# Patient Record
Sex: Male | Born: 1935 | Race: White | Hispanic: No | Marital: Married | Smoking: Never smoker
Health system: Southern US, Community
[De-identification: ages and names within clinical notes are randomized; demographics above are authoritative.]

## PROBLEM LIST (undated history)

## (undated) DIAGNOSIS — F0280 Dementia in other diseases classified elsewhere without behavioral disturbance: Secondary | ICD-10-CM

## (undated) DIAGNOSIS — K648 Other hemorrhoids: Secondary | ICD-10-CM

## (undated) DIAGNOSIS — Z86010 Personal history of colonic polyps: Secondary | ICD-10-CM

## (undated) DIAGNOSIS — E8881 Metabolic syndrome: Secondary | ICD-10-CM

## (undated) DIAGNOSIS — K449 Diaphragmatic hernia without obstruction or gangrene: Secondary | ICD-10-CM

## (undated) DIAGNOSIS — F419 Anxiety disorder, unspecified: Secondary | ICD-10-CM

## (undated) DIAGNOSIS — K5730 Diverticulosis of large intestine without perforation or abscess without bleeding: Secondary | ICD-10-CM

## (undated) DIAGNOSIS — K222 Esophageal obstruction: Secondary | ICD-10-CM

## (undated) DIAGNOSIS — N529 Male erectile dysfunction, unspecified: Secondary | ICD-10-CM

## (undated) DIAGNOSIS — I10 Essential (primary) hypertension: Secondary | ICD-10-CM

## (undated) DIAGNOSIS — C4490 Unspecified malignant neoplasm of skin, unspecified: Secondary | ICD-10-CM

## (undated) DIAGNOSIS — G309 Alzheimer's disease, unspecified: Secondary | ICD-10-CM

## (undated) DIAGNOSIS — N189 Chronic kidney disease, unspecified: Secondary | ICD-10-CM

## (undated) DIAGNOSIS — M1990 Unspecified osteoarthritis, unspecified site: Secondary | ICD-10-CM

## (undated) DIAGNOSIS — C61 Malignant neoplasm of prostate: Secondary | ICD-10-CM

## (undated) DIAGNOSIS — E785 Hyperlipidemia, unspecified: Secondary | ICD-10-CM

## (undated) DIAGNOSIS — I2510 Atherosclerotic heart disease of native coronary artery without angina pectoris: Secondary | ICD-10-CM

## (undated) DIAGNOSIS — I739 Peripheral vascular disease, unspecified: Secondary | ICD-10-CM

## (undated) HISTORY — PX: SHOULDER SURGERY: SHX246

## (undated) HISTORY — DX: Unspecified malignant neoplasm of skin, unspecified: C44.90

## (undated) HISTORY — DX: Esophageal obstruction: K22.2

## (undated) HISTORY — DX: Peripheral vascular disease, unspecified: I73.9

## (undated) HISTORY — DX: Male erectile dysfunction, unspecified: N52.9

## (undated) HISTORY — DX: Diaphragmatic hernia without obstruction or gangrene: K44.9

## (undated) HISTORY — DX: Hyperlipidemia, unspecified: E78.5

## (undated) HISTORY — DX: Personal history of colonic polyps: Z86.010

## (undated) HISTORY — DX: Metabolic syndrome: E88.81

## (undated) HISTORY — DX: Diverticulosis of large intestine without perforation or abscess without bleeding: K57.30

## (undated) HISTORY — DX: Chronic kidney disease, unspecified: N18.9

## (undated) HISTORY — PX: TONSILLECTOMY: SUR1361

## (undated) HISTORY — DX: Other hemorrhoids: K64.8

## (undated) HISTORY — PX: CERVICAL FUSION: SHX112

## (undated) HISTORY — DX: Essential (primary) hypertension: I10

## (undated) HISTORY — DX: Malignant neoplasm of prostate: C61

## (undated) HISTORY — PX: BACK SURGERY: SHX140

## (undated) HISTORY — DX: Unspecified osteoarthritis, unspecified site: M19.90

## (undated) HISTORY — DX: Anxiety disorder, unspecified: F41.9

## (undated) HISTORY — DX: Metabolic syndrome: E88.810

---
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
## 1993-01-10 HISTORY — PX: PROSTATECTOMY: SHX69

## 2000-03-31 ENCOUNTER — Emergency Department (HOSPITAL_COMMUNITY): Admission: EM | Admit: 2000-03-31 | Discharge: 2000-03-31 | Payer: Self-pay | Admitting: Internal Medicine

## 2000-03-31 ENCOUNTER — Encounter: Payer: Self-pay | Admitting: Emergency Medicine

## 2000-04-11 ENCOUNTER — Encounter: Admission: RE | Admit: 2000-04-11 | Discharge: 2000-05-11 | Payer: Self-pay | Admitting: Internal Medicine

## 2002-11-07 ENCOUNTER — Encounter: Admission: RE | Admit: 2002-11-07 | Discharge: 2002-11-07 | Payer: Self-pay | Admitting: Internal Medicine

## 2003-01-11 DIAGNOSIS — Z860100 Personal history of colon polyps, unspecified: Secondary | ICD-10-CM

## 2003-01-11 HISTORY — PX: COLONOSCOPY W/ POLYPECTOMY: SHX1380

## 2003-01-11 HISTORY — DX: Personal history of colon polyps, unspecified: Z86.0100

## 2003-01-11 HISTORY — DX: Personal history of colonic polyps: Z86.010

## 2003-12-02 ENCOUNTER — Ambulatory Visit: Payer: Self-pay | Admitting: Internal Medicine

## 2003-12-16 ENCOUNTER — Ambulatory Visit: Payer: Self-pay | Admitting: Gastroenterology

## 2003-12-24 ENCOUNTER — Ambulatory Visit: Payer: Self-pay | Admitting: Gastroenterology

## 2003-12-24 DIAGNOSIS — D126 Benign neoplasm of colon, unspecified: Secondary | ICD-10-CM | POA: Insufficient documentation

## 2003-12-24 DIAGNOSIS — K449 Diaphragmatic hernia without obstruction or gangrene: Secondary | ICD-10-CM | POA: Insufficient documentation

## 2003-12-24 DIAGNOSIS — K5730 Diverticulosis of large intestine without perforation or abscess without bleeding: Secondary | ICD-10-CM | POA: Insufficient documentation

## 2004-01-23 ENCOUNTER — Ambulatory Visit: Payer: Self-pay | Admitting: Gastroenterology

## 2004-03-25 ENCOUNTER — Ambulatory Visit: Payer: Self-pay | Admitting: Internal Medicine

## 2004-05-18 ENCOUNTER — Ambulatory Visit: Payer: Self-pay | Admitting: Internal Medicine

## 2004-07-08 ENCOUNTER — Encounter: Admission: RE | Admit: 2004-07-08 | Discharge: 2004-07-08 | Payer: Self-pay | Admitting: Internal Medicine

## 2004-07-08 ENCOUNTER — Ambulatory Visit: Payer: Self-pay | Admitting: Internal Medicine

## 2004-11-09 ENCOUNTER — Ambulatory Visit: Payer: Self-pay | Admitting: Internal Medicine

## 2005-01-21 ENCOUNTER — Ambulatory Visit: Payer: Self-pay | Admitting: Internal Medicine

## 2005-01-31 ENCOUNTER — Ambulatory Visit (HOSPITAL_BASED_OUTPATIENT_CLINIC_OR_DEPARTMENT_OTHER): Admission: RE | Admit: 2005-01-31 | Discharge: 2005-01-31 | Payer: Self-pay | Admitting: Internal Medicine

## 2005-02-08 ENCOUNTER — Ambulatory Visit: Payer: Self-pay | Admitting: Pulmonary Disease

## 2005-03-02 ENCOUNTER — Ambulatory Visit: Payer: Self-pay | Admitting: Pulmonary Disease

## 2005-06-09 ENCOUNTER — Ambulatory Visit: Payer: Self-pay | Admitting: Internal Medicine

## 2005-06-16 ENCOUNTER — Ambulatory Visit: Payer: Self-pay

## 2005-06-29 ENCOUNTER — Ambulatory Visit: Payer: Self-pay | Admitting: Internal Medicine

## 2005-07-04 ENCOUNTER — Ambulatory Visit: Payer: Self-pay | Admitting: Internal Medicine

## 2005-08-12 ENCOUNTER — Ambulatory Visit: Payer: Self-pay

## 2005-08-12 ENCOUNTER — Encounter: Payer: Self-pay | Admitting: Cardiology

## 2005-10-27 ENCOUNTER — Ambulatory Visit: Payer: Self-pay | Admitting: Internal Medicine

## 2006-02-14 ENCOUNTER — Ambulatory Visit: Payer: Self-pay | Admitting: Internal Medicine

## 2006-02-14 LAB — CONVERTED CEMR LAB
BUN: 18 mg/dL (ref 6–23)
Basophils Relative: 0.5 % (ref 0.0–1.0)
Eosinophils Relative: 1 % (ref 0.0–5.0)
HCT: 46.7 % (ref 39.0–52.0)
Hemoglobin: 15.5 g/dL (ref 13.0–17.0)
Lymphocytes Relative: 30.4 % (ref 12.0–46.0)
Monocytes Absolute: 0.4 10*3/uL (ref 0.2–0.7)
Neutro Abs: 3.5 10*3/uL (ref 1.4–7.7)
TSH: 1.37 microintl units/mL (ref 0.35–5.50)
WBC: 5.8 10*3/uL (ref 4.5–10.5)

## 2006-03-27 DIAGNOSIS — N189 Chronic kidney disease, unspecified: Secondary | ICD-10-CM | POA: Insufficient documentation

## 2006-03-27 DIAGNOSIS — H269 Unspecified cataract: Secondary | ICD-10-CM | POA: Insufficient documentation

## 2006-03-27 DIAGNOSIS — M1990 Unspecified osteoarthritis, unspecified site: Secondary | ICD-10-CM | POA: Insufficient documentation

## 2006-03-27 DIAGNOSIS — N529 Male erectile dysfunction, unspecified: Secondary | ICD-10-CM | POA: Insufficient documentation

## 2006-05-11 ENCOUNTER — Ambulatory Visit: Payer: Self-pay | Admitting: Internal Medicine

## 2006-05-11 ENCOUNTER — Encounter: Payer: Self-pay | Admitting: Internal Medicine

## 2006-05-12 ENCOUNTER — Encounter: Payer: Self-pay | Admitting: Internal Medicine

## 2006-07-04 ENCOUNTER — Telehealth (INDEPENDENT_AMBULATORY_CARE_PROVIDER_SITE_OTHER): Payer: Self-pay | Admitting: *Deleted

## 2006-09-12 ENCOUNTER — Telehealth (INDEPENDENT_AMBULATORY_CARE_PROVIDER_SITE_OTHER): Payer: Self-pay | Admitting: *Deleted

## 2006-09-15 ENCOUNTER — Ambulatory Visit: Payer: Self-pay | Admitting: Internal Medicine

## 2006-09-17 LAB — CONVERTED CEMR LAB
ALT: 16 units/L (ref 0–53)
AST: 18 units/L (ref 0–37)
Bilirubin, Direct: 0.1 mg/dL (ref 0.0–0.3)
Cholesterol: 146 mg/dL (ref 0–200)
HDL: 58 mg/dL (ref >39)
Potassium: 4.7 meq/L (ref 3.5–5.3)
Total CHOL/HDL Ratio: 2.5

## 2006-09-18 ENCOUNTER — Encounter (INDEPENDENT_AMBULATORY_CARE_PROVIDER_SITE_OTHER): Payer: Self-pay | Admitting: *Deleted

## 2006-11-13 ENCOUNTER — Ambulatory Visit: Payer: Self-pay | Admitting: Internal Medicine

## 2006-11-13 DIAGNOSIS — R1310 Dysphagia, unspecified: Secondary | ICD-10-CM | POA: Insufficient documentation

## 2006-11-13 DIAGNOSIS — E8881 Metabolic syndrome: Secondary | ICD-10-CM | POA: Insufficient documentation

## 2006-11-13 DIAGNOSIS — I739 Peripheral vascular disease, unspecified: Secondary | ICD-10-CM | POA: Insufficient documentation

## 2006-11-13 DIAGNOSIS — Z8546 Personal history of malignant neoplasm of prostate: Secondary | ICD-10-CM | POA: Insufficient documentation

## 2006-11-13 DIAGNOSIS — E785 Hyperlipidemia, unspecified: Secondary | ICD-10-CM | POA: Insufficient documentation

## 2006-11-16 ENCOUNTER — Ambulatory Visit: Payer: Self-pay | Admitting: Internal Medicine

## 2006-11-21 ENCOUNTER — Ambulatory Visit: Payer: Self-pay | Admitting: Internal Medicine

## 2006-11-21 ENCOUNTER — Encounter (INDEPENDENT_AMBULATORY_CARE_PROVIDER_SITE_OTHER): Payer: Self-pay | Admitting: *Deleted

## 2006-11-30 ENCOUNTER — Ambulatory Visit: Payer: Self-pay | Admitting: Gastroenterology

## 2006-12-04 ENCOUNTER — Ambulatory Visit (HOSPITAL_COMMUNITY): Admission: RE | Admit: 2006-12-04 | Discharge: 2006-12-04 | Payer: Self-pay | Admitting: Gastroenterology

## 2007-02-03 DIAGNOSIS — K648 Other hemorrhoids: Secondary | ICD-10-CM | POA: Insufficient documentation

## 2007-02-03 DIAGNOSIS — M129 Arthropathy, unspecified: Secondary | ICD-10-CM | POA: Insufficient documentation

## 2007-02-03 DIAGNOSIS — F419 Anxiety disorder, unspecified: Secondary | ICD-10-CM | POA: Insufficient documentation

## 2007-02-03 DIAGNOSIS — G4730 Sleep apnea, unspecified: Secondary | ICD-10-CM | POA: Insufficient documentation

## 2007-02-03 DIAGNOSIS — F411 Generalized anxiety disorder: Secondary | ICD-10-CM

## 2007-03-13 ENCOUNTER — Telehealth (INDEPENDENT_AMBULATORY_CARE_PROVIDER_SITE_OTHER): Payer: Self-pay | Admitting: *Deleted

## 2007-05-14 ENCOUNTER — Ambulatory Visit: Payer: Self-pay | Admitting: Internal Medicine

## 2007-05-23 ENCOUNTER — Ambulatory Visit: Payer: Self-pay | Admitting: Internal Medicine

## 2007-05-23 LAB — CONVERTED CEMR LAB
BUN: 15 mg/dL (ref 6–23)
Creatinine, Ser: 1.6 mg/dL — ABNORMAL HIGH (ref 0.4–1.5)

## 2007-05-29 ENCOUNTER — Encounter: Payer: Self-pay | Admitting: Internal Medicine

## 2007-05-31 ENCOUNTER — Encounter (INDEPENDENT_AMBULATORY_CARE_PROVIDER_SITE_OTHER): Payer: Self-pay | Admitting: *Deleted

## 2007-10-18 ENCOUNTER — Ambulatory Visit: Payer: Self-pay | Admitting: Internal Medicine

## 2008-01-07 ENCOUNTER — Ambulatory Visit: Payer: Self-pay | Admitting: Internal Medicine

## 2008-06-02 ENCOUNTER — Ambulatory Visit: Payer: Self-pay | Admitting: Internal Medicine

## 2008-06-03 ENCOUNTER — Encounter: Payer: Self-pay | Admitting: Internal Medicine

## 2008-06-06 LAB — CONVERTED CEMR LAB
Hgb A1c MFr Bld: 5.9 % (ref 4.6–6.5)
Potassium: 4.5 meq/L (ref 3.5–5.1)

## 2008-07-01 ENCOUNTER — Telehealth (INDEPENDENT_AMBULATORY_CARE_PROVIDER_SITE_OTHER): Payer: Self-pay | Admitting: *Deleted

## 2008-10-06 ENCOUNTER — Ambulatory Visit: Payer: Self-pay | Admitting: Internal Medicine

## 2008-11-27 ENCOUNTER — Ambulatory Visit: Payer: Self-pay | Admitting: Internal Medicine

## 2008-11-29 LAB — CONVERTED CEMR LAB
ALT: 15 units/L (ref 0–53)
Albumin: 4.2 g/dL (ref 3.5–5.2)
Alkaline Phosphatase: 38 units/L — ABNORMAL LOW (ref 39–117)
Cholesterol: 155 mg/dL (ref 0–200)
LDL Cholesterol: 75 mg/dL (ref 0–99)
Total Protein: 7.2 g/dL (ref 6.0–8.3)
Triglycerides: 107 mg/dL (ref 0.0–149.0)
VLDL: 21.4 mg/dL (ref 0.0–40.0)

## 2008-12-01 ENCOUNTER — Encounter (INDEPENDENT_AMBULATORY_CARE_PROVIDER_SITE_OTHER): Payer: Self-pay | Admitting: *Deleted

## 2009-01-12 ENCOUNTER — Telehealth (INDEPENDENT_AMBULATORY_CARE_PROVIDER_SITE_OTHER): Payer: Self-pay | Admitting: *Deleted

## 2009-01-19 ENCOUNTER — Ambulatory Visit: Payer: Self-pay | Admitting: Internal Medicine

## 2009-03-06 ENCOUNTER — Telehealth (INDEPENDENT_AMBULATORY_CARE_PROVIDER_SITE_OTHER): Payer: Self-pay | Admitting: *Deleted

## 2009-05-27 ENCOUNTER — Ambulatory Visit: Payer: Self-pay | Admitting: Internal Medicine

## 2009-05-27 DIAGNOSIS — R079 Chest pain, unspecified: Secondary | ICD-10-CM | POA: Insufficient documentation

## 2009-07-27 ENCOUNTER — Ambulatory Visit: Payer: Self-pay | Admitting: Family Medicine

## 2009-07-27 DIAGNOSIS — M5412 Radiculopathy, cervical region: Secondary | ICD-10-CM | POA: Insufficient documentation

## 2009-09-10 ENCOUNTER — Telehealth: Payer: Self-pay | Admitting: Internal Medicine

## 2009-10-11 ENCOUNTER — Encounter: Payer: Self-pay | Admitting: Internal Medicine

## 2009-11-02 ENCOUNTER — Encounter: Payer: Self-pay | Admitting: Internal Medicine

## 2009-11-02 ENCOUNTER — Ambulatory Visit: Payer: Self-pay | Admitting: Internal Medicine

## 2009-11-02 DIAGNOSIS — F0390 Unspecified dementia without behavioral disturbance: Secondary | ICD-10-CM | POA: Insufficient documentation

## 2009-11-02 DIAGNOSIS — Z85828 Personal history of other malignant neoplasm of skin: Secondary | ICD-10-CM | POA: Insufficient documentation

## 2009-11-02 LAB — CONVERTED CEMR LAB

## 2009-11-06 LAB — CONVERTED CEMR LAB
Albumin: 4.5 g/dL (ref 3.5–5.2)
Alkaline Phosphatase: 39 units/L (ref 39–117)
Basophils Absolute: 0 10*3/uL (ref 0.0–0.1)
Basophils Relative: 0.3 % (ref 0.0–3.0)
CO2: 27 meq/L (ref 19–32)
Calcium: 9.5 mg/dL (ref 8.4–10.5)
Chloride: 103 meq/L (ref 96–112)
Eosinophils Absolute: 0.1 10*3/uL (ref 0.0–0.7)
Glucose, Bld: 94 mg/dL (ref 70–99)
HCT: 47.3 % (ref 39.0–52.0)
HDL: 61.8 mg/dL (ref >39.00)
Hemoglobin: 16 g/dL (ref 13.0–17.0)
Lymphs Abs: 2.5 10*3/uL (ref 0.7–4.0)
MCHC: 33.9 g/dL (ref 30.0–36.0)
MCV: 92.6 fL (ref 78.0–100.0)
Monocytes Absolute: 0.5 10*3/uL (ref 0.1–1.0)
Neutro Abs: 3.4 10*3/uL (ref 1.4–7.7)
RBC: 5.12 M/uL (ref 4.22–5.81)
RDW: 14.4 % (ref 11.5–14.6)
Sodium: 140 meq/L (ref 135–145)
TSH: 1.94 microintl units/mL (ref 0.35–5.50)
Total CHOL/HDL Ratio: 3
Total Protein: 7.2 g/dL (ref 6.0–8.3)

## 2010-02-07 LAB — CONVERTED CEMR LAB
Albumin: 4.6 g/dL (ref 3.5–5.2)
Alkaline Phosphatase: 40 units/L (ref 39–117)
BUN: 15 mg/dL (ref 6–23)
Basophils Absolute: 0 10*3/uL (ref 0.0–0.1)
Cholesterol, target level: 200 mg/dL
Cholesterol: 153 mg/dL (ref 0–200)
Eosinophils Absolute: 0.1 10*3/uL (ref 0.0–0.6)
GFR calc Af Amer: 55 mL/min
HDL: 59.3 mg/dL (ref >39.0)
Hemoglobin: 16.5 g/dL (ref 13.0–17.0)
Hgb A1c MFr Bld: 5.9 % (ref 4.6–6.0)
LDL Cholesterol: 71 mg/dL (ref 0–99)
LDL Goal: 160 mg/dL
Lymphocytes Relative: 40.6 % (ref 12.0–46.0)
MCHC: 33.6 g/dL (ref 30.0–36.0)
MCV: 89.4 fL (ref 78.0–100.0)
Monocytes Absolute: 0.6 10*3/uL (ref 0.2–0.7)
Monocytes Relative: 8.9 % (ref 3.0–11.0)
Neutro Abs: 3.6 10*3/uL (ref 1.4–7.7)
PSA: 0 ng/mL — ABNORMAL LOW (ref 0.10–4.00)
Potassium: 4.8 meq/L (ref 3.5–5.1)
TSH: 1.74 microintl units/mL (ref 0.35–5.50)

## 2010-02-09 NOTE — Assessment & Plan Note (Signed)
Summary: CONSTANT COUGH///SPH   Vital Signs:  Patient profile:   77 year old male Weight:      199.4 pounds BMI:     29.13 Temp:     98.4 degrees F oral Pulse rate:   64 / minute Resp:     16 per minute BP sitting:   106 / 64  (left arm) Cuff size:   large  Vitals Entered By: Shonna Chock (May 27, 2009 12:17 PM) CC: Cough Comments REVIEWED MED LIST, PATIENT AGREED DOSE AND INSTRUCTION CORRECT    CC:  Cough.  History of Present Illness:  Cough      This is a 77 year old man who presents with Cough X 2 weeks.  The patient reports productive cough with white secretions, but denies pleuritic chest pain,but  he describes SS pain with coughing & with direct pressure. He denies  shortness of breath, wheezing, exertional dyspnea, fever, hemoptysis, and malaise.  Associated symtpoms include sore throat, nasal congestion, .Weight loss of 10 #  has been due to CVE , not illness.  The patient denies the following symptoms: cold/URI symptoms (no frontal headache, facial pain or purulence), acid reflux symptoms, and peripheral edema.  The cough is worse with lying down.  Ineffective prior treatments have included OTC cough medications, "day & night formulas".  Risk factors include history of Hiatal Hernia.  Non smoker; no PMH of RAD. His wife has had RTI symptoms X 1 week.  Allergies (verified): No Known Drug Allergies  Review of Systems CV:  Denies difficulty breathing at night, difficulty breathing while lying down, and leg cramps with exertion; No angina type chest pain; he walks & uses machines @ [facility] 3x/week w/o symptoms. Derm:  Complains of lesion(s); Multiple lesions on back" burned off" by Dr Donzetta Starch.  Physical Exam  General:  Appears younger than age,well-nourished,in no acute distress; alert,appropriate and cooperative throughout examination Ears:  External ear exam shows no significant lesions or deformities.  Otoscopic examination reveals clear canals, tympanic membranes are intact  bilaterally without bulging, retraction, inflammation or discharge.TMs dull Nose:  External nasal examination shows no deformity or inflammation. Nasal mucosa are pink and moist without lesions or exudates. Mouth:  Oral mucosa and oropharynx without lesions or exudates.  Teeth in good repair. Chest Wall:  chest wall tenderness bilaterally @ costochrondral -sternal interfaces.   Lungs:  Normal respiratory effort, chest expands symmetrically. Lungs are clear to auscultation, no crackles or wheezes. Heart:  regular rhythm, no gallop, no rub, no JVD, bradycardia, and grade 1 /6 systolic murmur.   Cervical Nodes:  No lymphadenopathy noted Axillary Nodes:  No palpable lymphadenopathy Psych:  memory intact for recent and remote, normally interactive, good eye contact, and not anxious appearing.     Impression & Recommendations:  Problem # 1:  BRONCHITIS-ACUTE (ICD-466.0)  The following medications were removed from the medication list:    Azithromycin 250 Mg Tabs (Azithromycin) .[name]... As per pack    Promethazine Vc/codeine 6.25-5-10 Mg/68ml Syrp (Phenyleph-promethazine-cod) .[name]... 1 tsp q 6 hrs as needed His updated medication list for this problem includes:    Doxycycline Hyclate 100 Mg Caps (Doxycycline hyclate) .[name]... 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 1 qd 2)  Fish Oil 1000 Mg Caps (Omega-3 fatty acids) .[name].. 1 by mouth once daily 3)  Folic  Acid 400 Mcg Tabs (Folic acid) .[name].. 1 by mouth two times a day 4)  Adult Aspirin Low Strength 81 Mg Tbdp (Aspirin) .[name].. 1 by mouth once daily 5)  Simvastatin 40 Mg Tabs (Simvastatin) .[name].. 1 at bedtime when vytorin completed 6)  Lorazepam 0.5 Mg Tabs (Lorazepam) .[name].. 1 q 12 hrs as needed for anxiety 7)  Doxycycline Hyclate 100 Mg Caps (Doxycycline hyclate) .[name].. 1 two times a day x 5 days ,then once  daily ,avoid direct sun 8)  Tramadol Hcl 50 Mg Tabs (Tramadol hcl) .[name].. 1 every 6 hrs as needed for chest pain  Patient Instructions: 1)  Drink as much fluid as you can tolerate for the next few days.Mucinex DM as needed for cough. Prescriptions: TRAMADOL HCL 50 MG TABS (TRAMADOL HCL) 1 every 6 hrs as needed for chest pain  #30 x 0   Entered and Authorized by:   Marga Melnick MD   Signed by:   Marga Melnick MD on 05/27/2009   Method used:   Faxed to ...       [id number]       [address]       [place], [place]  [address]       Ph: [phone] or [phone]       Fax: [phone]   RxID:   [id number] DOXYCYCLINE HYCLATE 100 MG CAPS (DOXYCYCLINE HYCLATE) 1 two times a day X 5 days ,then once daily ,avoid direct sun  #15 x 0   Entered and Authorized by:   Marga Melnick MD   Signed by:   Marga Melnick MD on 05/27/2009   Method used:   Faxed to ...       [id number]       [address]       [place], [place]  [address]       Ph: [phone] or [phone]       Fax: [phone]   RxID:   [id number]

## 2010-02-09 NOTE — Assessment & Plan Note (Signed)
Summary: COUGH, SORE THROAT, CONGESTION//FD   Vital Signs:  Patient profile:   77 year old male Height:      69.5 inches O2 Sat:      100 % on Room air Pulse rate:   58 / minute Resp:     15 per minute BP sitting:   118 / 70  (left arm)  Vitals Entered By: Jeremy Johann CMA (January 19, 2009 10:41 AM)  O2 Flow:  Room air CC: cough yellowish mucous, sore throat, chest congestion, drainage, headaches Comments REVIEWED MED LIST, PATIENT AGREED DOSE AND INSTRUCTION CORRECT    CC:  cough yellowish mucous, sore throat, chest congestion, drainage, and headaches.  History of Present Illness: Onset as coughing & sneezing @ Christmas. Now nasal congestion with some blood & intermittent purulence. Sputum is white, previously green. Rx:[facility] nasal spray, Delsym, Hall's drops  Allergies (verified): No Known Drug Allergies  Review of Systems General:  Complains of chills; denies fever and sweats. Eyes:  Complains of eye irritation; denies discharge and vision loss-both eyes. ENT:  Complains of nasal congestion and sore throat; Pain over crown. Resp:  Denies chest pain with inspiration and coughing up blood; Spreness with cough SS.  Physical Exam  General:  well-nourished,in no acute distress; alert,appropriate and cooperative throughout examination Eyes:  No corneal or conjunctival inflammation noted. EOMI. Perrla.Vision grossly normal. Ears:  TMs scarred Nose:  External nasal examination shows no deformity or inflammation. Nasal mucosa are slightly dry without lesions or exudates. Mouth:  Oral mucosa and oropharynx without lesions or exudates.  Teeth in good repair. Lungs:  Normal respiratory effort, chest expands symmetrically. Lungs are clear to auscultation, no crackles or wheezes but brassy cough. Cervical Nodes:  No lymphadenopathy noted Axillary Nodes:  No palpable lymphadenopathy   Impression & Recommendations:  Problem # 1:  BRONCHITIS-ACUTE (ICD-466.0)  His updated  medication list for this problem includes:    Azithromycin 250 Mg Tabs (Azithromycin) .[name]... As per pack    Promethazine Vc/codeine 6.25-5-10 Mg/77ml Syrp (Phenyleph-promethazine-cod) .[name]... 1 tsp q 6 hrs as needed  Problem # 2:  URI (ICD-465.9)  His updated medication list for this problem includes:    Adult Aspirin Low Strength 81 Mg Tbdp (Aspirin) .[name]... 1 by mouth once daily    Promethazine Vc/codeine 6.25-5-10 Mg/41ml Syrp (Phenyleph-promethazine-cod) .[name]... 1 tsp q 6 hrs as needed  Complete Medication List: 1)  Gnp Omeprazole 20 Mg Tbec (Omeprazole) .[name].. 1 qd 2)  Fish Oil 1000 Mg Caps (Omega-3 fatty acids) .[name].. 1 by mouth once daily 3)  Folic Acid 400 Mcg Tabs (Folic acid) .[name].. 1 by mouth two times a day 4)  Adult Aspirin Low Strength 81 Mg Tbdp (Aspirin) .[name].. 1 by mouth once daily 5)  Simvastatin 40 Mg Tabs (Simvastatin) .[name].. 1 at bedtime when vytorin completed 6)  Lorazepam 0.5 Mg Tabs (Lorazepam) .[name].. 1 q 12 hrs as needed for anxiety 7)  Azithromycin 250 Mg Tabs (Azithromycin) .... As per pack 8)  Promethazine Vc/codeine 6.25-5-10 Mg/46ml Syrp (Phenyleph-promethazine-cod) .[name].. 1 tsp q 6 hrs as needed  Patient Instructions: 1)  Drink as much fluid as you can tolerate for the next few days. Prescriptions: PROMETHAZINE VC/CODEINE 6.25-5-10 MG/5ML SYRP (PHENYLEPH-PROMETHAZINE-COD) 1 tsp q 6 hrs as needed  #120cc x 0   Entered and Authorized by:   Marga Melnick MD   Signed by:   Marga Melnick MD on 01/19/2009   Method used:   Printed then faxed to .Marland KitchenMarland Kitchen  [id number] ([facility]       [address]       [place], [place]  [address]       Ph: [phone] or [phone]       Fax: [phone]   RxID:   [id number] AZITHROMYCIN 250 MG TABS (AZITHROMYCIN) as per pack  #1 x 0   Entered and Authorized by:   Marga Melnick MD   Signed by:   Marga Melnick MD on 01/19/2009   Method used:   Faxed to ...       [id number]        [address]       [place], [place]  [address]       Ph: [phone] or [phone]       Fax: [phone]   RxID:   [id number]

## 2010-02-09 NOTE — Progress Notes (Signed)
  Phone Note Refill Request Message from:  [facility] on [phone]  Refills Requested: Medication #1:  GNP OMEPRAZOLE 20 MG  TBEC 1 qd Initial call taken by: Kandice Hams,  March 06, 2009 10:30 AM    Prescriptions: GNP OMEPRAZOLE 20 MG  TBEC (OMEPRAZOLE) 1 qd  #90 Capsule x 1   Entered by:   Kandice Hams   Authorized by:   Nolon Rod. Paz MD   Signed by:   Kandice Hams on 03/06/2009   Method used:   Faxed to ...       [facility] (mail-order)             ,          Ph: [phone]       Fax: [phone]   RxID:   [id number]

## 2010-02-09 NOTE — Progress Notes (Signed)
Summary: Discuss Issue  Phone Note Call from Patient   Caller: Daughter- Suzanna Obey Summary of Call: Pts daughter calls and states that her mother as well as herself would like to speak with Dr. Alwyn Ren about the mental state of her father. She believes he may be bi-polar but knows he wouldn't discuss this with Dr. Alwyn Ren. Daughter is requesting that if there is anyway they (daughter and mother) can discuss this with Dr. Alwyn Ren without the pt being here she would like to know what can be done. She wants Dr. Alwyn Ren to know about this issue before pt's CPX in November. Please advise. Ok to contact Daughter at work [phone] and have her paged.Lavell Islam  September 10, 2009 4:42 PM Initial call taken by: Lavell Islam,  September 10, 2009 4:43 PM  Follow-up for Phone Call        Please actually write out   specific concerns with examples of worrisome behavior. After I review this,I'll make specific recommendations. It would be very unusual for bipolarity to appear  @ his age& other causes of behavior change must be considered. I'd like to get him in after  I see that report . He has multiple health issues which can be the reason for that appt, @ which bipolar question  can be assessed. Follow-up by: Marga Melnick MD,  September 11, 2009 6:39 AM  Additional Follow-up for Phone Call Additional follow up Details #1::        Discuss with patient daughter will keep log and drop off to office..............Marland KitchenFelecia Deloach CMA  September 21, 2009 10:57 AM

## 2010-02-09 NOTE — Assessment & Plan Note (Signed)
Summary: CPX/FASTING//KN   Vital Signs:  Patient profile:   77 year old male Height:      70.75 inches Weight:      202.0 pounds BMI:     28.48 Temp:     97.6 degrees F oral Pulse rate:   56 / minute Resp:     14 per minute BP sitting:   140 / 78  (left arm) Cuff size:   large  Vitals Entered By: Shonna Chock CMA (November 02, 2009 8:39 AM) CC: CPX with fasting labs  Comments Height recorded with shoes on  Patient would like both the Flu and Shingles Vaccine-patient aware if rash develops-? would arise on the cause due to two vaccines given-patient ok'd information.   Primary Care Provider:  Alfonse Flavors  CC:  CPX with fasting labs .  History of Present Illness: Here for Medicare AWV: 1.Risk factors based on Past M, S, F history:CAD; Dyslipidemia;prostate cancer( chart updated).Letter from daughter concerning obsessive behavior was reviewed but not discussed with him.  2.Physical Activities: walks 3X / week for 1 mile + machines @ [facility] 3.Depression/mood: denies issues but describes familial conflicts 4.Hearing: slightly decreased to whisper @ 6 ft 5.ADL's: no limitations 6.Fall Risk: slight dizziness occasionally bending over                                                                                     7.Home Safety: safety proofed; smoke alarms in place 8.Height, weight, &visual acuity:wall chart read @ 6 ft with lenses 9.Counseling: POA in place ;?no  Living Will in place. 10.Labs ordered based on risk factors: see Orders 11.Referral Coordination: none requested 12. Care Plan: see Instructions 13.Cognitive Assessment: "Oct 11 or 12 ,2011"; memory & recall : 1 of 3 items remembered  ;unable to spell "WORLD" backwards &  math ability poor; mood & affect normal despite above history. Hyperlipidemia Follow-Up      This is a 77 year old man who also presents for Hyperlipidemia follow-up.  The patient reports itching on back intermittently, but denies muscle aches, GI upset, abdominal  pain, flushing, constipation, diarrhea, and fatigue.  The patient denies the following symptoms: chest pain/pressure, exercise intolerance, dypsnea, palpitations, syncope, and pedal edema.  Adjunctive measures currently used by the patient include ASA, folic acid, and fish oil supplements.He is very vague as to whether he is on stati @ this time.    Preventive Screening-Counseling & Management  Alcohol-Tobacco     Alcohol drinks/day: 0     Smoking Status: never  Caffeine-Diet-Exercise     Caffeine use/day: no caffeine     Diet Comments: no special diet     Does Patient Exercise: yes  Hep-HIV-STD-Contraception     Dental Visit-last 6 months no     Sun Exposure-Excessive: no  Safety-Violence-Falls     Seat Belt Use: yes     Firearms in the Home: firearms in the home     Firearm Counseling: not indicated; uses recommended firearm safety measures      Blood Transfusions:  no.        Travel History:  In [place] ?.    Current Medications (verified): 1)  Gnp  Omeprazole 20 Mg  Tbec (Omeprazole) .[name].. 1 Qd 2)  Fish Oil 1000 Mg Caps (Omega-3 Fatty Acids) .[name].. 1 By Mouth Once Daily 3)  Folic Acid 400 Mcg Tabs (Folic Acid) .[name].. 1 By Mouth Two Times A Day 4)  Adult Aspirin Low Strength 81 Mg Tbdp (Aspirin) .[name].. 1 By Mouth Once Daily 5)  Simvastatin 40 Mg Tabs (Simvastatin) .[name].. 1 At Bedtime When Vytorin Completed 6)  Lorazepam 0.5 Mg Tabs (Lorazepam) .[name].. 1 Q 12 Hrs As Needed For Anxiety 7)  Tramadol Hcl 50 Mg Tabs (Tramadol Hcl) .[name].. 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) DIVERTICULOSIS, COLON (ICD-562.10) ANXIETY (ICD-300.00) SLEEP APNEA (ICD-780.57) ARTHRITIS (ICD-716.90) DYSPHAGIA UNSPECIFIED (ICD-787.20) METABOLIC SYNDROME X (ICD-277.7) PVD  (ICD-443.9) HYPERTENSION (ICD-401.9), PMH of HYPERLIPIDEMIA (ICD-272.4) PROSTATE CANCER, HX OF (ICD-V10.46), Dr Aldean Ast IMPOTENCE, ORGANIC ORIGIN [phone]) CATARACT NOS (ICD-366.9) KIDNEY DISEASE, CHRONIC NOS (ICD-585.9) Skin cancer, PMH of, Melanoma 2006  Past Surgical History: Catheterization  1996, Dr Caprice Kluver; now seeing Dr Dietrich Pates Appendectomy Cervical fusion Colon polypectomy Prostatectomy 1995, Dr Aldean Ast Tonsillectomy Shoulder surgery for Rotator cuff repair  Family History: Father: throat cancer Mother:breast cancer,dementia , d MI @ 28 Siblings: 3 bro: CAD; bro prostate cancer; MGM: DM  Social History: no diet Retired Married Never Smoked Alcohol use-no Regular exercise-yes Caffeine use/day:  no caffeine Dental Care w/in 6 mos.:  no Sun Exposure-Excessive:  no Risk analyst Use:  yes Blood Transfusions:  no Does Patient Exercise:  yes  Review of Systems  The patient denies anorexia, fever, weight loss, weight gain, vision loss, hoarseness, prolonged cough, headaches, hemoptysis, melena, hematochezia, hematuria, suspicious skin lesions, unusual weight change, abnormal bleeding, enlarged lymph nodes, and angioedema.         Incontinence with caffeine intake.  Physical Exam  General:  in no acute distress; alert,appropriate and cooperative throughout examination Head:  Normocephalic and atraumatic without obvious abnormalities.  Pattern  alopecia  Eyes:  No corneal or conjunctival inflammation noted.Perrla. Funduscopic exam benign, without hemorrhages, exudates or papilledema.  Ears:  External ear exam shows no significant lesions or deformities.  Otoscopic examination reveals clear canals, tympanic membranes are intact bilaterally without bulging, retraction, inflammation or discharge.TMs dull Nose:  External nasal examination shows no deformity or inflammation. Nasal mucosa are pink and moist without lesions or exudates. Mouth:  Oral mucosa and  oropharynx without lesions or exudates.  Teeth in good repair. Neck:  No deformities, masses, or tenderness noted. Lungs:  Normal respiratory effort, chest expands symmetrically. Lungs are clear to auscultation, no crackles or wheezes. Heart:  Normal rate and regular rhythm. S1 and S2 normal without gallop, click, rub. S4 .Grade  1/2-1 /6 systolic murmur.   Abdomen:  Bowel sounds positive,abdomen soft and non-tender without masses, organomegaly or hernias noted. Genitalia:  Dr Aldean Ast Msk:  No deformity or scoliosis noted of thoracic or lumbar spine but thoracic muscles slightly asymmtric.   Pulses:  R and L carotid,radial,dorsalis pedis and posterior tibial pulses are full and equal bilaterally Extremities:  No clubbing, cyanosis, edema. DJD hand changes Neurologic:  DTRs symmetrical and normal.  Poor focus & memory Skin:  Intact without suspicious lesions or rashes Cervical Nodes:  No lymphadenopathy noted Psych:  normally interactive.  Exhibits obsession on religion. Unemotionally describes estrangement from family."They don't believe in God". I recommended Psychologist: "they  won't go"   Impression & Recommendations:  Problem # 1:  PREVENTIVE HEALTH CARE (ICD-V70.0)  Orders: [facility] -Subsequent Annual Wellness Visit [phone]) Specimen Handling (60454)  Problem # 2:  MEMORY LOSS (ICD-780.93) Possible Obsessive Compulsive Disorder Orders: TLB-B12, Serum-Total ONLY (09811-B14) T-RPR (Syphilis) (78295-62130) Specimen Handling (86578)  Problem # 3:  HYPERLIPIDEMIA (ICD-272.4)  His updated medication list for this problem includes:    Simvastatin 40 Mg Tabs (Simvastatin) .[name]... 1 at bedtime when vytorin completed  Orders: Venipuncture (46962) TLB-Lipid Panel (80061-LIPID) TLB-BMP (Basic Metabolic Panel-BMET) (80048-METABOL) TLB-Hepatic/Liver Function Pnl (80076-HEPATIC) TLB-TSH (Thyroid Stimulating Hormone) (84443-TSH) Specimen Handling (95284) EKG w/ Interpretation  (93000)  Problem # 4:  COLONIC POLYPS, HYPERPLASTIC (ICD-211.3)  Orders: Venipuncture (13244) TLB-CBC Platelet - w/Differential (85025-CBCD) Specimen Handling (01027)  Complete Medication List: 1)  Gnp Omeprazole 20 Mg Tbec (Omeprazole) .[name].. 1 qd 2)  Fish Oil 1000 Mg Caps (Omega-3 fatty acids) .[name].. 1 by mouth once daily 3)  Folic Acid 400 Mcg Tabs (Folic acid) .[name].. 1 by mouth two times a day 4)  Adult Aspirin Low Strength 81 Mg Tbdp (Aspirin) .[name].. 1 by mouth once daily 5)  Simvastatin 40 Mg Tabs (Simvastatin) .[name].. 1 at bedtime when vytorin completed 6)  Lorazepam 0.5 Mg Tabs (Lorazepam) .[name].. 1 q 12 hrs as needed for anxiety 7)  Tramadol Hcl 50 Mg Tabs (Tramadol hcl) .[name].. 1 every 6 hrs as needed for chest pain  Other Orders: Zoster (Shingles) Vaccine Live [phone]) Admin 1st Vaccine (44034) Flu Vaccine 61yrs + MEDICARE PATIENTS (V4259) Administration Flu vaccine - MCR (D6387)  Patient Instructions: 1)  Please consider seeing Psychologist with your family. Verify whether Living Will is in place.Take an Aspirin every day.Consider Audiology evaluation as discussed.Check your Blood Pressure regularly. If it is above: 135/85 ON AVERAGE  you should make an appointment.   Orders Added: 1)  Zoster (Shingles) Vaccine Live [90736] 2)  Admin 1st Vaccine [90471] 3)  Flu Vaccine 79yrs + MEDICARE PATIENTS [Q2039] 4)  Administration Flu vaccine - MCR [G0008] 5)  Est. Patient Level III [56433] 6)  [facility] -Subsequent Annual Wellness Visit [G0439] 7)  Venipuncture [36415] 8)  TLB-Lipid Panel [80061-LIPID] 9)  TLB-BMP (Basic Metabolic Panel-BMET) [80048-METABOL] 10)  TLB-CBC Platelet - w/Differential [85025-CBCD] 11)  TLB-Hepatic/Liver Function Pnl [80076-HEPATIC] 12)  TLB-TSH (Thyroid Stimulating Hormone) [84443-TSH] 13)  TLB-B12, Serum-Total ONLY [82607-B12] 14)  T-RPR (Syphilis) [29518-84166] 15)  Specimen Handling [99000] 16)  EKG w/ Interpretation [93000]   Immunization History:  Pneumovax  Immunization History:    Pneumovax:  historical (11/09/2004)  Tetanus/Td Immunization History:    Tetanus/Td:  historical (08/31/1995)  Immunizations Administered:  Zostavax # 1:    Vaccine Type: Zostavax    Site: Left Arm    Mfr: Merck    Dose: 0.32mL    Route: Ambrose    Given by: Shonna Chock CMA    Exp. Date: 08/28/2010    Lot #: [id number]    VIS given: 10/22/04 given November 02, 2009. Marland Kitchenlbmedflu    Immunization History:  Pneumovax Immunization History:    Pneumovax:  Historical (11/09/2004)  Tetanus/Td Immunization History:    Tetanus/Td:  Historical (08/31/1995)  Immunizations Administered:  Zostavax # 1:    Vaccine Type: Zostavax    Site: Left Arm    Mfr: Merck    Dose: 0.35mL    Route: Stevens    Given by: Shonna Chock CMA    Exp. Date: 08/28/2010    Lot #: [id number]    VIS given: 10/22/04 given  November 02, 2009.      Flu Vaccine Consent Questions     Do you have a history of severe allergic reactions to this vaccine? no    Any prior history of allergic reactions to egg and/or gelatin? no    Do you have a sensitivity to the preservative Thimersol? no    Do you have a past history of Guillan-Barre Syndrome? no    Do you currently have an acute febrile illness? no    Have you ever had a severe reaction to latex? no    Vaccine information given and explained to patient? yes    Are you currently pregnant? no    Lot Number:[id number]   Exp Date:07/10/2010   Site Given  Right Deltoid IM

## 2010-02-09 NOTE — Progress Notes (Signed)
Summary: REFILL  Phone Note Refill Request Message from:  Fax from Pharmacy on [facility] FAX [phone]  Refills Requested: Medication #1:  SIMVASTATIN 40 MG TABS 1 at bedtime WHEN Vytorin completed Initial call taken by: Barb Merino,  January 12, 2009 10:20 AM    Prescriptions: SIMVASTATIN 40 MG TABS (SIMVASTATIN) 1 at bedtime WHEN Vytorin completed  #90 x 3   Entered by:   Shonna Chock   Authorized by:   Marga Melnick MD   Signed by:   Shonna Chock on 01/12/2009   Method used:   Faxed to ...       [facility] (mail-order)             ,          Ph: [phone]       Fax: [phone]   RxID:   [id number]

## 2010-02-09 NOTE — Assessment & Plan Note (Signed)
Summary: neck pain/cbs   Vital Signs:  Patient profile:   77 year old male Height:      69.5 inches Weight:      197 pounds O2 Sat:      98 % on Room air Temp:     98.0 degrees F oral Pulse rate:   56 / minute BP sitting:   100 / 66  (left arm)  Vitals Entered By: Jeremy Johann CMA (July 27, 2009 2:34 PM)  O2 Flow:  Room air CC: stiffness in neck   History of Present Illness: Pt here with wife c/o neck pain after working on computer for several hours yesterday without moving.  Pt with hx c spine surgery.  Current Medications (verified): 1)  Gnp Omeprazole 20 Mg  Tbec (Omeprazole) .[name].. 1 Qd 2)  Fish Oil 1000 Mg Caps (Omega-3 Fatty Acids) .[name].. 1 By Mouth Once Daily 3)  Folic Acid 400 Mcg Tabs (Folic Acid) .[name].. 1 By Mouth Two Times A Day 4)  Adult Aspirin Low Strength 81 Mg Tbdp (Aspirin) .[name].. 1 By Mouth Once Daily 5)  Simvastatin 40 Mg Tabs (Simvastatin) .[name].. 1 At Bedtime When Vytorin Completed 6)  Lorazepam 0.5 Mg Tabs (Lorazepam) .[name].. 1 Q 12 Hrs As Needed For Anxiety 7)  Tramadol Hcl 50 Mg Tabs (Tramadol Hcl) .[name].. 1 Every 6 Hrs As Needed For Chest Pain 8)  Flexeril 10 Mg Tabs (Cyclobenzaprine Hcl) .[name].. 1 By Mouth Three Times A Day 9)  Vicodin Es 7.5-750 Mg Tabs (Hydrocodone-Acetaminophen) .[name].. 1 By Mouth Every 6 Hours As Needed  Allergies (verified): No Known Drug Allergies  Past History:  Family History: Last updated: 11/27/2008 Father: throat CA Mother:breast CA,dementia , d MI @ 24 Siblings: 3 bro CAD; bro prostate CA  Social History: Last updated: 11/13/2006 no diet  Risk Factors: Exercise: no (11/13/2006)  Risk Factors: Smoking Status: never (11/13/2006)  Past medical, surgical, family and social histories (including risk factors) reviewed for relevance to current acute and chronic problems.  Past Medical History: Reviewed history from 06/02/2008 and no changes required. 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 Cath 1996, Dr Caprice Kluver; now seeing Dr Dietrich Pates Appendectomy Cervical fusion Colon polypectomy Prostatectomy 1995, Dr Aldean Ast Tonsillectomy shoulder surgery  Family History: Reviewed history from 11/27/2008 and no changes required. Father: throat CA Mother:breast CA,dementia , d MI @ 40 Siblings: 3 bro CAD; bro prostate CA  Social History: Reviewed history from 11/13/2006 and no changes required. no diet  Review of Systems      See HPI  Physical Exam  General:  Well-developed,well-nourished,in no acute distress; alert,appropriate and cooperative throughout examination Neck:  able to move head very little Msk:  + muscle spasms and tenderness b/l traps Neurologic:  strength normal in all extremities and gait normal.   Psych:  Oriented X3 and normally interactive.     Impression & Recommendations:  Problem # 1:  CERVICAL STRAIN, WITH RADICULOPATHY (ICD-723.4) flexeril and vicodin moist heat  Orders: T-Cervicle Spine 2-3 Views (83151VO) Admin of Therapeutic Inj  intramuscular or subcutaneous (16073) Ketorolac-Toradol 15mg  (X1062)  Complete Medication List: 1)  Gnp Omeprazole 20 Mg  Tbec (Omeprazole) .[name].. 1 qd 2)  Fish Oil 1000 Mg Caps (Omega-3 fatty acids) .[name].. 1 by mouth once daily 3)  Folic Acid 400 Mcg Tabs (Folic acid) .[name].. 1 by mouth two times a day 4)  Adult Aspirin Low Strength 81 Mg Tbdp  (Aspirin) .[name].. 1 by mouth once daily 5)  Simvastatin 40 Mg Tabs (Simvastatin) .[name].. 1 at bedtime when vytorin completed 6)  Lorazepam 0.5 Mg Tabs (Lorazepam) .[name].. 1 q 12 hrs as needed for anxiety 7)  Tramadol Hcl 50 Mg Tabs (Tramadol hcl) .[name].. 1 every 6 hrs as needed for chest pain 8)  Flexeril 10 Mg Tabs (Cyclobenzaprine hcl) .[name].. 1 by mouth three times a day 9)  Vicodin Es 7.5-750 Mg Tabs (Hydrocodone-acetaminophen) .[name].. 1 by mouth every 6 hours as needed Prescriptions: VICODIN ES 7.5-750 MG TABS (HYDROCODONE-ACETAMINOPHEN) 1 by mouth every 6 hours as needed  #30 x 0   Entered and Authorized by:   Loreen Freud DO   Signed by:   Loreen Freud DO on 07/27/2009   Method used:   Print then Give to Patient   RxID:   [id number] FLEXERIL 10 MG TABS (CYCLOBENZAPRINE HCL) 1 by mouth three times a day  #30 x 0   Entered and Authorized by:   Loreen Freud DO   Signed by:   Loreen Freud DO on 07/27/2009   Method used:   [facility] to        [id number]       [address]       [place], [place]  [address]       Ph: [phone] or [phone]       Fax: [phone]   RxID:   [id number]    Medication Administration  Injection # 1:    Medication: Ketorolac-Toradol 15mg     Diagnosis: CERVICAL STRAIN, WITH RADICULOPATHY (ICD-723.4)    Route: IM    Site: LUOQ gluteus    Exp Date: 03/11/2011    Lot #: [id number]    Mfr: novaplus    Comments: 60 mg given to pt    Patient tolerated injection without complications    Given by: Jeremy Johann CMA (July 27, 2009 3:20 PM)  Orders Added: 1)  T-Cervicle Spine 2-3 Views [72040TC] 2)  Admin of Therapeutic Inj  intramuscular or subcutaneous [96372] 3)  Ketorolac-Toradol 15mg  [J1885] 4)  Est. Patient Level III [78938]

## 2010-02-09 NOTE — Letter (Signed)
Summary: Letter with Patient Concerns  Letter with Patient Concerns   Imported By: Lanelle Bal 10/31/2009 08:54:43  _____________________________________________________________________  External Attachment:    Type:   Image     Comment:   External Document

## 2010-04-16 ENCOUNTER — Encounter: Payer: Self-pay | Admitting: Internal Medicine

## 2010-04-20 ENCOUNTER — Encounter: Payer: Self-pay | Admitting: Internal Medicine

## 2010-04-20 ENCOUNTER — Ambulatory Visit (INDEPENDENT_AMBULATORY_CARE_PROVIDER_SITE_OTHER): Payer: Medicare Other | Admitting: Internal Medicine

## 2010-04-20 DIAGNOSIS — D179 Benign lipomatous neoplasm, unspecified: Secondary | ICD-10-CM

## 2010-04-20 DIAGNOSIS — I998 Other disorder of circulatory system: Secondary | ICD-10-CM

## 2010-04-20 DIAGNOSIS — I878 Other specified disorders of veins: Secondary | ICD-10-CM

## 2010-04-20 NOTE — Progress Notes (Signed)
  Subjective:    Patient ID: Matthew [name], male    DOB: 03/24/1930, 77 y.o.   MRN: [id number]  HPI  Skin Lesion:  Location:  See notes Onset#1 ): back lesion noted 1 month ago #2) L forearm for years Course:  #1) no change #2) ? Enlarged over past year Self-treated with: no             Improvement with treatment: N/A  History Pruritis: yes, #1  Tenderness: no  New medications/antibiotics: no  Tick/insect/pet exposure: no  Recent travel: no  New detergent, new clothing, or other topical exposure: no   Red Flags Feeling ill: no  Fever: no  Mouth lesions: no  Facial/tongue swelling/difficulty breathing:  no  Diabetic or immunocompromised: no. PMH of skin cancers & prostate cancer       Review of Systems see above     Objective:   Physical Exam General appearance is one of good health and nourishment. Skull is normocephalic without lymphadenopathy about the head, neck, or axilla. See current vital signs Abdomen: soft and non-tender without masses, organomegaly or hernias noted.  No guarding or rebound.  Skin:  Intact without suspicious lesions or rashes . Phlebolith 0.5 X 0.5 cm L forearmTransilluminating  Lipoma 2 x 2.5 cm R post thorax. No  Inguinal LA.    Assessment & Plan:  #1) lipoma #2) phlebolith Plan : report increased size, redness, or pain

## 2010-04-20 NOTE — Patient Instructions (Signed)
Mix Eucerin 1 part to Cort Aid 1 part and apply  Twice a day to itchy area of skin as needed .

## 2010-05-19 ENCOUNTER — Telehealth: Payer: Self-pay | Admitting: Internal Medicine

## 2010-05-19 MED ORDER — OMEPRAZOLE 20 MG PO CPDR: 20.0000 mg | DELAYED_RELEASE_CAPSULE | Freq: Every day | ORAL | Status: DC

## 2010-05-19 NOTE — Telephone Encounter (Signed)
Called and cancelled rx for [facility] [address] and forwarded rx to [facility]

## 2010-05-19 NOTE — Telephone Encounter (Signed)
Patient's wife  called [facility] to ask for refill---was told that patient had no refills left and to call his doctors office to request new prescription----he needs new prescription for Omeprazole 20 mg --90 day supply plus refills

## 2010-05-25 NOTE — Assessment & Plan Note (Signed)
[facility]                         GASTROENTEROLOGY OFFICE NOTE   Matthew [name], Matthew [name]                        MRN:          [id number]  DATE:11/30/2006                            DOB:          June 26, 1930    Matthew [name] is a 77 year old white male with hypertensive cardiovascular  disease, referred through the courtesy of Dr. Alwyn Ren for evaluation of  dysphagia.   Matthew [name] in the past has been diagnosed as having acid reflux problems  and actually had endoscopy with empiric dilation in December 2005.  He  has been on intermittent PPI therapy ever since and really denies  classic reflux symptoms at this time.  He today relates that he is  having repeat episodes of liquid dysphagia in his retropharyngeal area  with some aspiration and some material coming back through his nares.  There is a note that he has had previous cervical spine fusion therapy  in the early 1990s by Dr. Annell Greening.  He does describe some difficulty  chewing and what sounds like possible weakness of his masticatory  muscles.  He denies other neurological symptoms and has had no history  of peripheral vascular disease, CVA, atrial fibrillation, etcetera.  He  has had no problems with balance, memory, or muscle weakness.  He had  some mild constipation but apparently this currently is not a problem  and he had a negative colonoscopy in 2005, except for some mild  diverticulosis and internal hemorrhoids.   PAST MEDICAL HISTORY:  Remarkable for:  1. Hypertension.  2. Hyperlipidemia.  3. He has also had treatment for carcinoma of the prostate.  4. He suffers from degenerative arthritis.  5. Chronic anxiety syndrome.  6. Sleep apnea.  7. In addition to his neck and prostate surgery, he has had  a      previous tonsillectomy.  8. He had a cardiac catheterization that was unremarkable in June      2007.   MEDICATIONS:  1. Aspirin 81 mg a day.  2. Vytorin 10/40 mg at bedtime.  3.  Folic acid 2 a day.  4. Omeprazole 20 mg a day.  5. Daily fish oil.   He denies drug allergies.   FAMILY HISTORY:  Remarkable for breast cancer in his mother and several  brothers with atherosclerotic cardiovascular disease.  There are no  known gastrointestinal problems in his family.   SOCIAL HISTORY:  He is married and lives with his wife.  He is retired  from his job as a Consulting civil engineer.  He does not smoke or abuse  ethanol.   REVIEW OF SYSTEMS:  Otherwise noncontributory, except for some shortness  of breath with exertion and some chronic fatigue.  He has vague  arthralgias and chronic low back pain.  He denies current  cardiovascular, pulmonary, or other neurological difficulties.   PHYSICAL EXAMINATION:  GENERAL:  He is a healthy-appearing white male in  no distress, appearing his stated age.  VITAL SIGNS:  He is 5 feet 11 inches tall and weighs 204 pounds, blood  pressure 102/64, and pulse was 88 and regular.  HEENT:  Examination of the oropharynx was unremarkable as was  examination of the neck.  I could not appreciate any lymphadenopathy or  other masses in his neck.  He did have some bilateral neck scars but  these were well healed.  CHEST:  Entirely clear and he was in a regular rhythm without murmurs,  gallops, or rubs.  ABDOMEN:  I could not appreciate hepatosplenomegaly, abdominal mass, or  tenderness.  Bowel sounds were normal.  EXTREMITIES:  Unremarkable.  NEUROLOGIC:  There were no gross focal neurologic deficits and testing  of his cranial nerves seemed intact.  His mental status was clear.   ASSESSMENT:  I think Matthew [name] most likely has a neuromuscular  dysphagia and it is probably related to his previous cervical neck  surgery.  However, we need to consider myasthenia gravis and other  possible neurologic etiologies.   RECOMMENDATIONS:  1. Cine-esophagogram and barium swallow with pill.  2. Neurologic referral depending on results of number one.   3. Continue empiric reflux regimen and daily PPI therapy.  4. Other medications as per Dr. Alwyn Ren.     Vania Rea. Jarold Motto, MD, Caleen Essex, FAGA  Electronically Signed    DRP/MedQ  DD: 11/30/2006  DT: 11/30/2006  Job #: [id number]

## 2010-05-28 NOTE — Procedures (Signed)
NAME:  Matthew [name], Matthew [name] NO.:  [id number]   MEDICAL RECORD NO.:  [id number]          PATIENT TYPE:  OUT   LOCATION:  SLEEP CENTER                 FACILITY:  [facility]   PHYSICIAN:  Marcelyn Bruins, M.D. [facility] DATE OF BIRTH:  [birth year]   DATE OF STUDY:  01/31/2005                              NOCTURNAL POLYSOMNOGRAM   REFERRING PHYSICIAN:  Dr. Lona Kettle   INDICATION FOR THE STUDY:  Hypersomnia with sleep apnea.   EPWORTH SCORE:  6.   SLEEP ARCHITECTURE:  The patient had a total sleep time of 298 minutes with  significantly decreased REM and slow wave sleep. Sleep onset latency was  mildly prolonged as was REM onset. Sleep efficiency was decreased at 66%.   RESPIRATORY DATA:  The patient underwent a split night study where during  the first half of the night he was found to have 120 obstructive events in  the first 168 minutes of sleep. This gave him a respiratory disturbance  index of 43 events per hour. The events were much more common in the supine  position and loud snoring was noted. By protocol the patient was then placed  on a medium Respironics Comfort full face mask due to mouth opening and CPAP  titration was initiated. At a pressure of 13 cm the patient had significant  breakthrough events due to suboptimal pressure. Unfortunately, he awoke and  was not able to return to sleep for further titration.   OXYGEN DATA:  The patient had O2 desaturation as low as 83% with his  respiratory events.   CARDIAC DATA:  No clinically significant cardiac arrhythmias.   MOVEMENT/PARASOMNIA:  The patient had small numbers of leg jerks with no  significant sleep disruption.   IMPRESSION:  Split night study reveals severe obstructive sleep apnea with a  respiratory disturbance index of 43 events per hour and oxygen desaturation  as low as 83%. The patient was then placed on CPAP and titrated as high as  13 cm with persistent breakthrough events. The patient was not able to  return to sleep, and therefore optimal titration was not achieved. I would  recommend starting the patient on 10 cm of CPAP at this time and considering  either return for formal titration at a later date verses an auto titrate  machine. Clinical correlation is suggested.          ______________________________  Marcelyn Bruins, M.D. [facility]  Diplomate, American Board of Sleep  Medicine    KC/MEDQ  D:  02/08/2005 11:21:06  T:  02/08/2005 15:27:53  Job:  [id number]

## 2010-09-02 ENCOUNTER — Other Ambulatory Visit: Payer: Self-pay | Admitting: Internal Medicine

## 2010-11-10 ENCOUNTER — Encounter: Payer: Self-pay | Admitting: Internal Medicine

## 2010-11-11 ENCOUNTER — Encounter: Payer: Self-pay | Admitting: Internal Medicine

## 2010-11-11 ENCOUNTER — Ambulatory Visit (INDEPENDENT_AMBULATORY_CARE_PROVIDER_SITE_OTHER): Payer: Medicare Other | Admitting: Internal Medicine

## 2010-11-11 DIAGNOSIS — Z23 Encounter for immunization: Secondary | ICD-10-CM

## 2010-11-11 DIAGNOSIS — D126 Benign neoplasm of colon, unspecified: Secondary | ICD-10-CM

## 2010-11-11 DIAGNOSIS — Z136 Encounter for screening for cardiovascular disorders: Secondary | ICD-10-CM

## 2010-11-11 DIAGNOSIS — E785 Hyperlipidemia, unspecified: Secondary | ICD-10-CM

## 2010-11-11 DIAGNOSIS — Z0000 Encounter for general adult medical examination without abnormal findings: Secondary | ICD-10-CM

## 2010-11-11 DIAGNOSIS — E8881 Metabolic syndrome: Secondary | ICD-10-CM

## 2010-11-11 LAB — LIPID PANEL: HDL: 64.7 mg/dL (ref >39.00)

## 2010-11-11 LAB — CBC WITH DIFFERENTIAL/PLATELET
Basophils Absolute: 0 10*3/uL (ref 0.0–0.1)
Eosinophils Absolute: 0 10*3/uL (ref 0.0–0.7)
Hemoglobin: 15.5 g/dL (ref 13.0–17.0)
Lymphocytes Relative: 43.7 % (ref 12.0–46.0)
MCHC: 34.2 g/dL (ref 30.0–36.0)
Neutro Abs: 2.7 10*3/uL (ref 1.4–7.7)
Neutrophils Relative %: 47.5 % (ref 43.0–77.0)
RDW: 13.3 % (ref 11.5–14.6)

## 2010-11-11 LAB — HEPATIC FUNCTION PANEL
AST: 18 U/L (ref 0–37)
Albumin: 4.5 g/dL (ref 3.5–5.2)
Alkaline Phosphatase: 35 U/L — ABNORMAL LOW (ref 39–117)

## 2010-11-11 LAB — BASIC METABOLIC PANEL
CO2: 30 mEq/L (ref 19–32)
Calcium: 9.5 mg/dL (ref 8.4–10.5)
Creatinine, Ser: 1 mg/dL (ref 0.4–1.5)
Glucose, Bld: 94 mg/dL (ref 70–99)

## 2010-11-11 LAB — TSH: TSH: 1.95 u[IU]/mL (ref 0.35–5.50)

## 2010-11-11 NOTE — Progress Notes (Signed)
Subjective:    Patient ID: Matthew [name], male    DOB: 08-31-1930, 77 y.o.   MRN: [id number]  HPI Medicare Wellness Visit:  The following psychosocial & medical history were reviewed as required by Medicare.   Social history: caffeine: 1 cup coffee/ day , alcohol:  no ,  tobacco use : never  & exercise : [employer] 3 X/ week : walking & weights.   Home & personal  safety / fall risk: see dizziness below, activities of daily living: no limitations , seatbelt use : yes , and smoke alarm employment : yes .  Power of Attorney/Living Will status : yes  Vision ( as recorded per Nurse) & Hearing  evaluation :  Ophth seen last week; cataracts. Acuity decreased; aids NOT worn. Orientation :oriented X3 , memory & recall : 2 of 3 recalled,  math testing: unable to complete,and mood & affect : normal . Depression / anxiety: denied Travel history : [place] 2005 , immunization status :tetanus & PNA vaccine needed , transfusion history:  no, and preventive health surveillance ( colonoscopies, BMD , etc as per protocol/ [facility]): ? Up to date, Dental care:  @ least annually . Chart reviewed &  Updated. Active issues reviewed & addressed.      Review of Systems HYPERLIPIDEMIA: Chest pain, palpitations- no       Dyspnea- no Medications: Compliance- yes  Lightheadedness,Syncope- occasional dizziness upon standing. Previously on BP meds, not now. Edema- no Abd pain, bowel changes- constipation Muscle aches- no    NWG:NFAOZHY HYPERGLYCEMIA: FBS 100 in  2008, pre weight loss Polyuria/phagia/dipsia- no       Visual problems- occasional blurring positionally  Apparently he had remotely seen Dr. Sandria Manly, neurologists in the past. They do not believe this was for assessment of his memory but rather for assessment of peripheral neurologic issues. He describes diffuse constant pressure-type headaches;no treatment . "I try to not pay attention". He is unable to tell me how long he has had the "pressure" in his head. He  questioned whether it might be related to head trauma as a child in his preteen years.             Objective:   Physical Exam Gen.: Healthy and well-nourished in appearance. Alert and cooperative throughout exam. Head: Normocephalic without obvious abnormalities;  pattern alopecia  Eyes: No corneal or conjunctival inflammation noted.  Ears: External  ear exam reveals no significant lesions or deformities. Canals; some wax, R > L.  clear .TMs scarred.  Nose: External nasal exam reveals no deformity or inflammation. Nasal mucosa are pink and moist. No lesions or exudates noted.  Mouth: Oral mucosa and oropharynx reveal no lesions or exudates. Teeth in good repair. Neck: No deformities, masses, or tenderness noted. Range of motion decreased. Thyroid normal. Lungs: Normal respiratory effort; chest expands symmetrically. Lungs are clear to auscultation without rales, wheezes, or increased work of breathing. Heart: Normal rate and rhythm. Normal S1 and S2. No gallop, click, or rub. Grade1/ 6 R base murmur. Abdomen: Bowel sounds normal; abdomen soft and nontender. No masses, organomegaly or hernias noted. Genitalia/DRE: deferred to [facility]   .  Musculoskeletal/extremities: No deformity or scoliosis noted of  the thoracic or lumbar spine. No clubbing, cyanosis, edema, or deformity noted.  .Tone & strength  Normal.Joints;mixed arthritic finger changes. Nail health  good. Vascular: Carotid, radial artery, dorsalis pedis and  posterior tibial pulses are full and equal. No bruits present. Neurologic:  Deep tendon reflexes symmetrical and 1/2+.          Skin: Intact without suspicious lesions or rashes. Lymph: No cervical, axillary  lymphadenopathy present. Psych: Mood and affect are normal. Normally interactive                                                                                           Assessment & Plan:  #1 Medicare Wellness Exam; criteria met ; data entered #2 Problem List reviewed ; Assessment/ Recommendations made #3 I shall review the old chart concerning his memory & headache  issues. He may have been evaluated by Dr. Sandria Manly in the past. Plan: see Orders    EKG reveals nonspecific ST-T wave changes in leads 1, aVL, and V 3 through V6. These are slightly accentuated compared to 11/02/09. [facility] is [facility]

## 2010-11-11 NOTE — Patient Instructions (Signed)
Preventive Health Care: Exercise at least 30-45 minutes a day,  3-4 days a week.  Eat a low-fat diet with lots of fruits and vegetables, up to 7-9 servings per day. Consume less than 40 grams of sugar per day from foods & drinks with High Fructose Corn Sugar as # 1,2,3 or # 4 on label.  

## 2010-12-07 ENCOUNTER — Other Ambulatory Visit: Payer: Self-pay | Admitting: Internal Medicine

## 2011-02-11 ENCOUNTER — Ambulatory Visit (INDEPENDENT_AMBULATORY_CARE_PROVIDER_SITE_OTHER)
Admission: RE | Admit: 2011-02-11 | Discharge: 2011-02-11 | Disposition: A | Discharge status: Home / Self Care | Payer: Medicare Other | Source: Ambulatory Visit | Attending: Internal Medicine | Admitting: Internal Medicine

## 2011-02-11 ENCOUNTER — Encounter: Payer: Self-pay | Admitting: Internal Medicine

## 2011-02-11 ENCOUNTER — Ambulatory Visit (INDEPENDENT_AMBULATORY_CARE_PROVIDER_SITE_OTHER): Payer: Medicare Other | Admitting: Internal Medicine

## 2011-02-11 DIAGNOSIS — R042 Hemoptysis: Secondary | ICD-10-CM

## 2011-02-11 DIAGNOSIS — I7300 Raynaud's syndrome without gangrene: Secondary | ICD-10-CM

## 2011-02-11 DIAGNOSIS — J019 Acute sinusitis, unspecified: Secondary | ICD-10-CM

## 2011-02-11 DIAGNOSIS — J209 Acute bronchitis, unspecified: Secondary | ICD-10-CM

## 2011-02-11 MED ORDER — HYDROCODONE-HOMATROPINE 5-1.5 MG/5ML PO SYRP: 5.0000 mL | ORAL_SOLUTION | Freq: Four times a day (QID) | ORAL | Status: DC | PRN

## 2011-02-11 MED ORDER — AMOXICILLIN 500 MG PO CAPS: 500.0000 mg | ORAL_CAPSULE | Freq: Three times a day (TID) | ORAL | Status: DC

## 2011-02-11 MED ORDER — AMOXICILLIN 500 MG PO CAPS: 500.0000 mg | ORAL_CAPSULE | Freq: Three times a day (TID) | ORAL | Status: AC

## 2011-02-11 NOTE — Patient Instructions (Addendum)
Plain Mucinex for thick secretions ;force NON dairy fluids . Use a Neti pot daily as needed for sinus congestion Order for x-rays entered into  the computer; these will be performed at [address] [facility]. across from [facility]. No appointment is necessary.  Use Eucerin   for the nasal drying.

## 2011-02-11 NOTE — Progress Notes (Signed)
  Subjective:    Patient ID: Matthew [name], male    DOB: August 12, 1930, 78 y.o.   MRN: [id number]  HPI Respiratory tract infection Onset/symptoms:last week with rhinitis Exposures (illness/environmental/extrinsic):ill granddaughter Progression of symptoms:to purulence with blood tinge  Treatments/response:Sudafed & OTC cough meds w/o benefit Present symptoms: Fever/chills/sweats:no Frontal headache:no Facial pain:no Nasal purulence:persists Sore throat:yes, "itchy" Dental pain:no Lymphadenopathy:no Wheezing/shortness of breath:some dyspnea Cough/sputum/hemoptysis:streaks of blood in yellow phlegm Pleuritic pain:no Smoking history:never  There is no personal or family history of lung disease except for lung cancer in his father who was a smoker.           Review of Systems On 02/03/11 he noted some bluish color to his fingertips. This was not in the context of cool environment; his wife said that fingertips were cool to touch .  He denies any other bleeding dyscrasias other than some epistaxis with the acute illness and the streaky hemoptysis. Specifically he has had no hematuria, melena, or rectal bleeding.  He is on no anticoagulants other than prophylactic aspirin     Objective:   Physical Exam General appearance:good health ;well nourished; no acute distress or increased work of breathing is present.  No  lymphadenopathy about the head, neck, or axilla noted.   Eyes: No conjunctival inflammation or lid edema is present.  Ears:  External ear exam shows no significant lesions or deformities.  Otoscopic examination reveals clear canals, tympanic membranes are dull white/ gray but intact bilaterally without bulging, retraction, inflammation or discharge.  Nose:  External nasal examination shows no deformity or inflammation. Nasal mucosa are pink and moist without lesions or exudates. No septal dislocation or deviation.No obstruction to airflow.   Oral exam: Dental hygiene is  good; lips and gums are healthy appearing.There is no oropharyngeal erythema or exudate noted.   Heart:  Normal rate and regular rhythm. S1 and S2 normal without gallop, murmur, click, rub or other extra sounds.   Lungs:Chest clear to auscultation; no wheezes, rhonchi,rales ,or rubs present.No increased work of breathing.    Extremities:  No cyanosis, edema, or clubbing  noted .  Radial artery pulses are intact  Skin: Warm & dry           Assessment & Plan:    #1 rhinosinusitis  #2 bronchitis with some streaky hemoptysis  #3 Raynaud's phenomena  Plan: See orders and recommendations

## 2011-02-18 ENCOUNTER — Other Ambulatory Visit: Payer: Self-pay | Admitting: Internal Medicine

## 2011-02-18 DIAGNOSIS — R042 Hemoptysis: Secondary | ICD-10-CM

## 2011-02-18 DIAGNOSIS — J209 Acute bronchitis, unspecified: Secondary | ICD-10-CM

## 2011-02-18 MED ORDER — HYDROCODONE-HOMATROPINE 5-1.5 MG/5ML PO SYRP: 5.0000 mL | ORAL_SOLUTION | Freq: Four times a day (QID) | ORAL | Status: AC | PRN

## 2011-02-18 NOTE — Telephone Encounter (Signed)
Rx sent 

## 2011-02-18 NOTE — Telephone Encounter (Signed)
90 cc , 1 tsp q 6 hrs prn . OVINB

## 2011-02-18 NOTE — Telephone Encounter (Signed)
Dr.Hopper please advise 

## 2011-03-31 ENCOUNTER — Encounter: Payer: Self-pay | Admitting: Family Medicine

## 2011-03-31 ENCOUNTER — Ambulatory Visit (INDEPENDENT_AMBULATORY_CARE_PROVIDER_SITE_OTHER): Payer: Medicare Other | Admitting: Family Medicine

## 2011-03-31 VITALS — BP 120/70 | HR 59 | Temp 97.9°F | Wt 210.2 lb

## 2011-03-31 DIAGNOSIS — J329 Chronic sinusitis, unspecified: Secondary | ICD-10-CM

## 2011-03-31 MED ORDER — GUAIFENESIN-CODEINE 100-10 MG/5ML PO SYRP: ORAL_SOLUTION | ORAL | Status: DC

## 2011-03-31 MED ORDER — AMOXICILLIN-POT CLAVULANATE 875-125 MG PO TABS: 1.0000 | ORAL_TABLET | Freq: Two times a day (BID) | ORAL | Status: AC

## 2011-03-31 NOTE — Patient Instructions (Signed)

## 2011-03-31 NOTE — Progress Notes (Signed)
  Subjective:     BABY STAIRS is a 78 y.o. male here for evaluation of a cough. Onset of symptoms was 3 days ago. Symptoms have been gradually worsening since that time. The cough is productive and is aggravated by exercise and reclining position. Associated symptoms include: shortness of breath and sputum production. Patient does not have a history of asthma. Patient does not have a history of environmental allergens. Patient has not traveled recently. Patient does not have a history of smoking. Patient has not had a previous chest x-ray. Patient has not had a PPD done.  The following portions of the patient's history were reviewed and updated as appropriate: allergies, current medications, past family history, past medical history, past social history, past surgical history and problem list.  Review of Systems Pertinent items are noted in HPI.    Objective:    Oxygen saturation 97% on room air BP 120/70  Pulse 59  Temp(Src) 97.9 F (36.6 C) (Oral)  Wt 210 lb 3.2 oz (95.346 kg)  SpO2 97% General appearance: alert, cooperative, appears stated age and no distress Ears: normal TM's and external ear canals both ears Nose: green discharge, mild congestion, sinus tenderness bilateral Throat: abnormal findings: mild oropharyngeal erythema and pnd Neck: mild anterior cervical adenopathy, supple, symmetrical, trachea midline and thyroid not enlarged, symmetric, no tenderness/mass/nodules Lungs: clear to auscultation bilaterally Heart: S1, S2 normal Extremities: extremities normal, atraumatic, no cyanosis or edema    Assessment:    Sinusitis    Plan:    Antibiotics per medication orders. Antitussives per medication orders. Avoid exposure to tobacco smoke and fumes. Call if shortness of breath worsens, blood in sputum, change in character of cough, development of fever or chills, inability to maintain nutrition and hydration. Avoid exposure to tobacco smoke and fumes. Trial of steroid  nasal spray.

## 2011-04-20 ENCOUNTER — Ambulatory Visit (INDEPENDENT_AMBULATORY_CARE_PROVIDER_SITE_OTHER): Payer: Medicare Other | Admitting: Internal Medicine

## 2011-04-20 VITALS — BP 120/68 | HR 51 | Temp 98.0°F | Wt 197.0 lb

## 2011-04-20 DIAGNOSIS — R51 Headache: Secondary | ICD-10-CM

## 2011-04-20 DIAGNOSIS — G8929 Other chronic pain: Secondary | ICD-10-CM

## 2011-04-20 DIAGNOSIS — J029 Acute pharyngitis, unspecified: Secondary | ICD-10-CM

## 2011-04-20 LAB — POCT RAPID STREP A (OFFICE): Rapid Strep A Screen: NEGATIVE

## 2011-04-20 MED ORDER — TRAMADOL HCL 50 MG PO TABS: 50.0000 mg | ORAL_TABLET | Freq: Four times a day (QID) | ORAL | Status: DC | PRN

## 2011-04-20 NOTE — Patient Instructions (Addendum)
Please keep a diary of your headaches . Document  each occurrence on the calendar with notation of : #1 any prodrome ( any non headache symptom such as marked fatigue,visual changes, ,etc ) which precedes actual headache ; #2) severity on 1-10 scale; #3) any triggers ( food/ drink,enviromenntal or weather changes ,physical or emotional stress) in 8-12 hour period prior to the headache; & #4) response to any medications or other intervention. Please review "Headache" @ [facility] for additional information.  Please bring this record  to the Neuro  appt  Zicam Melts or Zinc lozenges ; vitamin C 2000 mg daily; & Echinacea for 4-7 days. Report fever, exudate("pus") or progressive pain.

## 2011-04-20 NOTE — Progress Notes (Signed)
  Subjective:    Patient ID: Matthew [name], male    DOB: 1930/12/10, 78 y.o.   MRN: [id number]   HPI Respiratory tract infection Onset/symptoms:3 days ago as itchy throat Exposures (illness/environmental/extrinsic):sick great grand children Progression of symptoms:to ST Treatments/response:no Rx Present symptoms: Fever/chills/sweats:no Frontal headache:no Facial pain:no Nasal purulence:yellow Dental pain:no Lymphadenopathy:no Wheezing/shortness of breath:no Cough/sputum/hemoptysis:white Associated extrinsic/allergic symptoms:itchy eyes/ sneezing:no Past medical history: Seasonal allergies : no/asthma: no Smoking history:never          Review of Systems  As I was leaving the exam room he mentioned that he  has had chronic headaches for years, questionably worse in the last 6 months. He is seen Dr. Avie Echevaria in the past as well as having been evaluated [facility].  He states that there is no difference in his headache from those he has experienced in the past  He describes these as throbbing and over the crown.  He denies blurred vision, double vision, or loss of vision. He had no sudden hearing loss or tinnitus. He denies any weakness or numbness or tingling. He's had no incontinence of urine or stool.     Objective:   Physical Exam General appearance:good health ;well nourished; no acute distress or increased work of breathing is present.  No  lymphadenopathy about the head, neck, or axilla noted.   Eyes: No conjunctival inflammation or lid edema is present. Extraocular motions intact. Field of vision is normal. Vision is normal with lenses  Ears:  External ear exam shows no significant lesions or deformities.  Otoscopic examination reveals gray  tympanic membranes & wax. Marked decrease in hearing bilaterally Nose:  External nasal examination shows no deformity or inflammation. Nasal mucosa are pink and moist without lesions or exudates. No  septal dislocation or deviation.No obstruction to airflow.   Oral exam: Dental hygiene is good; lips and gums are healthy appearing.There is mild  oropharyngeal erythema  w/o  exudate .    Heart:  Normal rate and regular rhythm. S1 and S2 normal without gallop,  click, rub or other extra sounds. Grade 1/6 systolic murmur   Lungs:Chest clear to auscultation; no wheezes, rhonchi,rales ,or rubs present.No increased work of breathing.    Extremities:  No cyanosis, edema, or clubbing  noted. Osteoarthritic change in his hands  Neuro: No definite cranial nerve deficit. Deep tendon reflexes are equal and WNL.Muscular strength and tone are normal.   Psych: "la belle indifference" affect as headaches described   Skin: Warm & dry           Assessment & Plan:  #1 Pharyngitis w/o worrisome history or clinical findings  #2 chronic headaches, actually a chronic, recurrent issue. This is in context of prior  family reported obsessive behavior. I question neuropsychiatric process such as Parkinson's variant  Plan beta strep  Keep  headache diary and bring that record with him to Neuro appt

## 2011-05-06 ENCOUNTER — Telehealth: Payer: Self-pay | Admitting: Internal Medicine

## 2011-05-06 MED ORDER — OMEPRAZOLE 20 MG PO CPDR: 20.0000 mg | DELAYED_RELEASE_CAPSULE | Freq: Every day | ORAL | Status: DC

## 2011-05-06 NOTE — Telephone Encounter (Signed)
Refill: Omeprazole 20mg  cap. 90 day supply

## 2011-05-06 NOTE — Telephone Encounter (Signed)
RX sent

## 2011-05-10 ENCOUNTER — Other Ambulatory Visit: Payer: Self-pay | Admitting: Internal Medicine

## 2011-06-14 ENCOUNTER — Other Ambulatory Visit: Payer: Self-pay | Admitting: Internal Medicine

## 2011-07-27 ENCOUNTER — Ambulatory Visit (INDEPENDENT_AMBULATORY_CARE_PROVIDER_SITE_OTHER): Payer: Medicare Other | Admitting: Internal Medicine

## 2011-07-27 ENCOUNTER — Encounter: Payer: Self-pay | Admitting: Internal Medicine

## 2011-07-27 VITALS — BP 124/70 | HR 70 | Temp 98.3°F | Wt 198.2 lb

## 2011-07-27 DIAGNOSIS — R300 Dysuria: Secondary | ICD-10-CM

## 2011-07-27 DIAGNOSIS — R350 Frequency of micturition: Secondary | ICD-10-CM

## 2011-07-27 DIAGNOSIS — Z8546 Personal history of malignant neoplasm of prostate: Secondary | ICD-10-CM

## 2011-07-27 DIAGNOSIS — R3915 Urgency of urination: Secondary | ICD-10-CM

## 2011-07-27 DIAGNOSIS — IMO0001 Reserved for inherently not codable concepts without codable children: Secondary | ICD-10-CM

## 2011-07-27 LAB — POCT URINALYSIS DIPSTICK
Ketones, UA: UNDETERMINED
Nitrite, UA: UNDETERMINED
Protein, UA: UNDETERMINED
pH, UA: 5

## 2011-07-27 MED ORDER — CIPROFLOXACIN HCL 500 MG PO TABS: 500.0000 mg | ORAL_TABLET | Freq: Two times a day (BID) | ORAL | Status: AC

## 2011-07-27 NOTE — Progress Notes (Signed)
  Subjective:    Patient ID: Matthew [name], male    DOB: 1930-12-27, 78 y.o.   MRN: [id number]  HPI DYSURIA: Symptoms began 07/24/11 in the context of being constipated. This was associated with frequency, urgency  with dysuria which has persisted. He is unsure whether over-the-counter Azo was of benefit; obviously it turned his urine orange.  He denies frequent urinary tract infections or any genitourinary abnormality other than the past history of prostate cancer. He has a followup appointment with his urologist 7/29. On at least one occasion he questioned whether he did have kidney stones but this has never been documented.  His father had nephrectomy on one side and a partial nephrectomy on the other. He is unsure as to the nature of his father's renal disease        Review of Systems He denies hesitancy, flank pain, hematuria, pyuria, fever, chills, or sweats.     Objective:   Physical Exam General appearance is one of good health and nourishment w/o distress.  Eyes: No conjunctival inflammation or scleral icterus is present.    Heart:  Normal rate and regular rhythm. S1 and S2 normal without gallop, murmur, click, rub . S4 with slurring at  LSB     Lungs:Chest clear to auscultation; no wheezes, rhonchi,rales ,or rubs present.No increased work of breathing.   Abdomen: Bowel sounds are decreased but no ileus is present. There is dullness to percussion right upper quadrant. There is minimal discomfort in the left lower quadrant to deep palpation. No guarding or rebound .  No pain to percussion over the flanks  Skin:Warm & dry.  Intact without suspicious lesions or rashes ; no jaundice   Lymphatic: No lymphadenopathy is noted about the head, neck, axilla areas.              Assessment & Plan:  #1 dysuria, urgency, frequency. The Azo inhibits adequate analysis of the urine.  Plan: Antibiotic will be initiated pending return of culture.

## 2011-07-27 NOTE — Patient Instructions (Addendum)
Drink as much nondairy fluids as possible. Avoid spicy foods or alcohol as  these may aggravate the condition. Do not take decongestants. Avoid narcotics if possible.  Please try to go on My Chart within the next 24 hours to allow me to release the results directly to you.

## 2011-07-29 LAB — URINE CULTURE: Colony Count: 2000

## 2011-08-04 ENCOUNTER — Telehealth: Payer: Self-pay | Admitting: Gastroenterology

## 2011-08-04 NOTE — Telephone Encounter (Signed)
Pt's wife informed I have sent for the chart and I will get back with pt. His last ECL was in 2005, but I don't have the path report; wife stated understanding.

## 2011-08-05 NOTE — Telephone Encounter (Signed)
See PA ?

## 2011-08-05 NOTE — Telephone Encounter (Signed)
Informed wife that Dr Jarold Motto doesn't feel pt needs a recall at his age. Explained that unless a pt has problems or a change in bowel patterns, we won't do a recall. She states over the past few months, pt has developed constipation; stool softeners don't work and he usually needs a laxative for any results. The past week or so he's had blood in his stool, but it may be from straining. Pt had recent UTI requiring Cipro. Pt eats fruit w/o helping and he tries to increase his fluids. Please advise. Thanks.

## 2011-08-05 NOTE — Telephone Encounter (Signed)
Dr Jarold Motto, pt had COLON 12/2003 with path showing hyperplastic polyp in the sigmoid; no adenomatous change or malignancy identified. Pt is 78 years old. When should I enter the Recall for? Thanks.

## 2011-08-05 NOTE — Telephone Encounter (Signed)
Not needed

## 2011-08-05 NOTE — Telephone Encounter (Signed)
Informed pt's wife of appt with Willette Cluster, NP on 08/10/11. She asked if pt could do anything new until then and informed her pt could take Miralax up to BID until then; wife stated understanding.

## 2011-08-09 ENCOUNTER — Encounter: Payer: Self-pay | Admitting: *Deleted

## 2011-08-10 ENCOUNTER — Encounter: Payer: Self-pay | Admitting: Nurse Practitioner

## 2011-08-10 ENCOUNTER — Ambulatory Visit (INDEPENDENT_AMBULATORY_CARE_PROVIDER_SITE_OTHER): Payer: Medicare Other | Admitting: Nurse Practitioner

## 2011-08-10 VITALS — BP 124/60 | HR 64 | Ht 70.0 in | Wt 197.1 lb

## 2011-08-10 DIAGNOSIS — K5900 Constipation, unspecified: Secondary | ICD-10-CM | POA: Insufficient documentation

## 2011-08-10 NOTE — Progress Notes (Signed)
08/10/2011 Matthew [name] [id number] May 14, 1930   HISTORY OF PRESENT ILLNESS: Patient is a 78 year old male who has been seen in the past by Dr. Jarold Motto. He has a history of diverticulosis, colon polyps and hemorrhoids. Patient has not been seen here in several years  he comes in today with complaints of rectal bleeding. Patient has had increasding constipaton over the last year. Three weeks ago patient became so severely constipated that he required an enema and manual removal of stool.. He has had intermittent LLQ pain associated with the constipation. He drinks a lot of water and eats high fiber foods. He has not started any new medications recently which could have contributed to the worsening constipation  Past Medical History  Diagnosis Date  . Arthritis   . Anxiety   . Hypertension   . Hyperlipidemia   . Chronic kidney disease   . Prostate cancer   . Cataract     NOS  . Impotence of organic origin   . PVD (peripheral vascular disease)   . Metabolic syndrome X   . Diverticulosis of colon   . Hemorrhoids, internal   . FH: colonic polyps   . Hiatal hernia   . Skin cancer   . History of colon polyps     hyperplastic   Past Surgical History  Procedure Date  . Appendectomy 1950  . Cervical fusion   . Colonoscopy w/ polypectomy   . Prostatectomy 1995    Dr.[name]; no chemo or radiation  . Tonsillectomy   . Shoulder surgery     right    reports that he has never smoked. He has never used smokeless tobacco. He reports that he does not drink alcohol or use illicit drugs. family history includes Breast cancer in his mother; Coronary artery disease in his brother; Dementia in his mother; Diabetes in his maternal grandmother; Heart attack (age of onset:93) in his mother; Heart disease in his brother and maternal grandmother; Kidney disease in his father; Lung cancer in his father; Prostate cancer in his brother; and Throat cancer in his father. No Known Allergies      Outpatient Encounter Prescriptions as of 08/10/2011  Medication Sig Dispense Refill  . aspirin 81 MG tablet Take 81 mg by mouth daily.        . cyclobenzaprine (FLEXERIL) 10 MG tablet Take 10 mg by mouth 2 (two) times daily as needed.      . folic acid (FOLVITE) 400 MCG tablet Take 400 mcg by mouth 2 (two) times daily.        . Omega-3 Fatty Acids (FISH OIL) 1000 MG CAPS Take 1,000 mg by mouth daily.        [name] omeprazole (PRILOSEC) 20 MG capsule Take 1 capsule (20 mg total) by mouth daily.  90 capsule  2  . simvastatin (ZOCOR) 40 MG tablet TAKE 1 TABLET AT BEDTIME WHEN VYTORIN COMPLETED (LABS DUE 10/2010)  90 tablet  2  . traMADol (ULTRAM) 50 MG tablet TAKE 1 TABLET (50 MG TOTAL) BY MOUTH EVERY 6 (SIX) HOURS AS NEEDED FOR PAIN.  30 tablet  0  . DISCONTD: guaiFENesin-codeine (ROBITUSSIN AC) 100-10 MG/5ML syrup 1-2 tsp po qhs prn  120 mL  0  . DISCONTD: LORazepam (ATIVAN) 0.5 MG tablet Take 0.5 mg by mouth every 12 (twelve) hours.        [name] DISCONTD: traMADol (ULTRAM) 50 MG tablet Take 50 mg by mouth every 6 (six) hours as needed.  REVIEW OF SYSTEMS  : Positive for headaches  All other systems reviewed and negative except where noted in the History of Present Illness.   PHYSICAL EXAM: BP 124/60  Pulse 64  Ht 5\' 10"  (1.778 m)  Wt 197 lb 2 oz (89.415 kg)  BMI 28.28 kg/m2 General: Well developed white male in no acute distress Head: Normocephalic and atraumatic Eyes:  sclerae anicteric,conjunctive pink. Ears: Normal auditory acuity Neck: Supple, no masses.  Lungs: Clear throughout to auscultation Heart: Regular rate and rhythm Abdomen: Soft, non distended, nontender. No masses or hepatomegaly noted. Normal Bowel sounds Rectal: No stool in vault Musculoskeletal: Symmetrical with no gross deformities  Skin: No lesions on visible extremities Extremities: No edema or deformities noted Neurological: Alert oriented x 4, grossly nonfocal Cervical Nodes:  No significant cervical  adenopathy Psychological:  Alert and cooperative. Normal mood and affect  ASSESSMENT AND PLAN: 1. chronic constipation, progressive over the last several weeks and associated with intermittent LLQ pain. Will purge bowels over the next couple days and start MiraLax once daily. If this is ineffective, patient will call our office within the next few days. Otherwise he should followup with Dr. Jarold Motto in a few weeks.  2. self-limited rectal bleeding, likely secondary to hemorrhoids. He has only had 2 episodes of bleeding. Hopefully no further bleeding after resolution of constipation.  3. History of colon polyps. Last colonoscopy 2005 was negative for adenomatous colon polyps. No surveillance colonoscopies plan given advanced age.

## 2011-08-10 NOTE — Patient Instructions (Addendum)
We have given you a sample of a Moviprep to help purge your bowels.  Take 1/2 the prep this evening and the other half tomorrow morning.  Take Miralax, 1 dose, 17 grams in 8 oz of water.   We made you a follow up appointment with Dr. Jarold Motto on 08-30-2011 at 2:30 PM.

## 2011-08-11 ENCOUNTER — Encounter: Payer: Self-pay | Admitting: Nurse Practitioner

## 2011-08-11 ENCOUNTER — Telehealth: Payer: Self-pay | Admitting: Nurse Practitioner

## 2011-08-11 NOTE — Progress Notes (Signed)
Reviewed and agree with management. Robert D. Kaplan, M.D., FACG  

## 2011-08-12 ENCOUNTER — Telehealth: Payer: Self-pay | Admitting: *Deleted

## 2011-08-12 NOTE — Telephone Encounter (Signed)
The patient's wife called me and report that he had done the first dose on Wed night 7-31 and the 2nd dose of Moviprep on 8-1 in the AM.  He didn't move his bowels until after he finished the 2nd dose.  He had real good results and was able to get rid of a lot of stool.  He is feeling good .  His wife asked when he should start the Miralax daily.  I told him since he got cleaned out good, and is able to eat, he could start it on Sunday or Monday.

## 2011-08-16 NOTE — Telephone Encounter (Signed)
Good news.

## 2011-08-25 NOTE — Telephone Encounter (Signed)
See note from 08-12-11

## 2011-08-29 ENCOUNTER — Telehealth: Payer: Self-pay | Admitting: Internal Medicine

## 2011-08-29 MED ORDER — SIMVASTATIN 40 MG PO TABS: ORAL_TABLET | ORAL | Status: DC

## 2011-08-29 NOTE — Telephone Encounter (Signed)
Rx sent 

## 2011-08-29 NOTE — Telephone Encounter (Signed)
Refill: Simvastatin tab 40mg . Take 1 tablet at bedtime when vytorin completed. (Labs due 10/2010). 90 day supply

## 2011-08-30 ENCOUNTER — Ambulatory Visit (INDEPENDENT_AMBULATORY_CARE_PROVIDER_SITE_OTHER): Payer: Medicare Other | Admitting: Gastroenterology

## 2011-08-30 ENCOUNTER — Encounter: Payer: Self-pay | Admitting: Gastroenterology

## 2011-08-30 VITALS — BP 112/66 | HR 76 | Ht 69.0 in | Wt 195.2 lb

## 2011-08-30 DIAGNOSIS — K625 Hemorrhage of anus and rectum: Secondary | ICD-10-CM

## 2011-08-30 DIAGNOSIS — K648 Other hemorrhoids: Secondary | ICD-10-CM

## 2011-08-30 DIAGNOSIS — K5730 Diverticulosis of large intestine without perforation or abscess without bleeding: Secondary | ICD-10-CM

## 2011-08-30 DIAGNOSIS — K5900 Constipation, unspecified: Secondary | ICD-10-CM

## 2011-08-30 MED ORDER — HYDROCORTISONE ACETATE 25 MG RE SUPP: 25.0000 mg | Freq: Every evening | RECTAL | Status: AC | PRN

## 2011-08-30 NOTE — Patient Instructions (Addendum)
Your physician has requested that you go to the basement for the following lab work before leaving today: Ifob test. We have sent the following medications to your pharmacy for you to pick up at your convenience: Anusol HC suppositories. Take Metamucil over the counter one teaspoon daily in juice.  Take Miralax as needed at bedtime. High Fiber Diet A high fiber diet changes your normal diet to include more whole grains, legumes, fruits, and vegetables. Changes in the diet involve replacing refined carbohydrates with unrefined foods. The calorie level of the diet is essentially unchanged. The Dietary Reference Intake (recommended amount) for adult males is 38 g per day. For adult females, it is 25 g per day. Pregnant and lactating women should consume 28 g of fiber per day. Fiber is the intact part of a plant that is not broken down during digestion. Functional fiber is fiber that has been isolated from the plant to provide a beneficial effect in the body. PURPOSE  Increase stool bulk.   Ease and regulate bowel movements.   Lower cholesterol.  INDICATIONS THAT YOU NEED MORE FIBER  Constipation and hemorrhoids.   Uncomplicated diverticulosis (intestine condition) and irritable bowel syndrome.   Weight management.   As a protective measure against hardening of the arteries (atherosclerosis), diabetes, and cancer.  NOTE OF CAUTION If you have a digestive or bowel problem, ask your caregiver for advice before adding high fiber foods to your diet. Some of the following medical problems are such that a high fiber diet should not be used without consulting your caregiver:  Acute diverticulitis (intestine infection).   Partial small bowel obstructions.   Complicated diverticular disease involving bleeding, rupture (perforation), or abscess (boil, furuncle).   Presence of autonomic neuropathy (nerve damage) or gastric paresis (stomach cannot empty itself).  GUIDELINES FOR INCREASING  FIBER  Start adding fiber to the diet slowly. A gradual increase of about 5 more grams (2 slices of whole-wheat bread, 2 servings of most fruits or vegetables, or 1 bowl of high fiber cereal) per day is best. Too rapid an increase in fiber may result in constipation, flatulence, and bloating.   Drink enough water and fluids to keep your urine clear or pale yellow. Water, juice, or caffeine-free drinks are recommended. Not drinking enough fluid may cause constipation.   Eat a variety of high fiber foods rather than one type of fiber.   Try to increase your intake of fiber through using high fiber foods rather than fiber pills or supplements that contain small amounts of fiber.   The goal is to change the types of food eaten. Do not supplement your present diet with high fiber foods, but replace foods in your present diet.  INCLUDE A VARIETY OF FIBER SOURCES  Replace refined and processed grains with whole grains, canned fruits with fresh fruits, and incorporate other fiber sources. White rice, white breads, and most bakery goods contain little or no fiber.   Brown whole-grain rice, buckwheat oats, and many fruits and vegetables are all good sources of fiber. These include: broccoli, Brussels sprouts, cabbage, cauliflower, beets, sweet potatoes, white potatoes (skin on), carrots, tomatoes, eggplant, squash, berries, fresh fruits, and dried fruits.   Cereals appear to be the richest source of fiber. Cereal fiber is found in whole grains and bran. Bran is the fiber-rich outer coat of cereal grain, which is largely removed in refining. In whole-grain cereals, the bran remains. In breakfast cereals, the largest amount of fiber is found in those with "bran"  in their names. The fiber content is sometimes indicated on the label.   You may need to include additional fruits and vegetables each day.   In baking, for 1 cup white flour, you may use the following substitutions:   1 cup whole-wheat flour  minus 2 tbs.    cup white flour plus  cup whole-wheat flour.  Document Released: 12/27/2004 Document Revised: 12/16/2010 Document Reviewed: 11/04/2008 [facility] Patient Information 2012 [place], [place].  Diverticulosis Diverticulosis is a common condition that develops when small pouches (diverticula) form in the wall of the colon. The risk of diverticulosis increases with age. It happens more often in people who eat a low-fiber diet. Most individuals with diverticulosis have no symptoms. Those individuals with symptoms usually experience abdominal pain, constipation, or loose stools (diarrhea). HOME CARE INSTRUCTIONS   Increase the amount of fiber in your diet as directed by your caregiver or dietician. This may reduce symptoms of diverticulosis.   Your caregiver may recommend taking a dietary fiber supplement.   Drink at least 6 to 8 glasses of water each day to prevent constipation.   Try not to strain when you have a bowel movement.   Your caregiver may recommend avoiding nuts and seeds to prevent complications, although this is still an uncertain benefit.   Only take over-the-counter or prescription medicines for pain, discomfort, or fever as directed by your caregiver.  FOODS WITH HIGH FIBER CONTENT INCLUDE:  Fruits. Apple, peach, pear, tangerine, raisins, prunes.   Vegetables. Brussels sprouts, asparagus, broccoli, cabbage, carrot, cauliflower, romaine lettuce, spinach, summer squash, tomato, winter squash, zucchini.   Starchy Vegetables. Baked beans, kidney beans, lima beans, split peas, lentils, potatoes (with skin).   Grains. Whole wheat bread, brown rice, bran flake cereal, plain oatmeal, white rice, shredded wheat, bran muffins.  SEEK IMMEDIATE MEDICAL CARE IF:   You develop increasing pain or severe bloating.   You have an oral temperature above 102 F (38.9 C), not controlled by medicine.   You develop vomiting or bowel movements that are bloody or black.   Document Released: 09/24/2003 Document Revised: 12/16/2010 Document Reviewed: 05/27/2009 [facility] Patient Information 2012 [place], [place].  Hemorrhoids Hemorrhoids are enlarged (dilated) veins around the rectum. There are 2 types of hemorrhoids, and the type of hemorrhoid is determined by its location. Internal hemorrhoids occur in the veins just inside the rectum.They are usually not painful, but they may bleed.However, they may poke through to the outside and become irritated and painful. External hemorrhoids involve the veins outside the anus and can be felt as a painful swelling or hard lump near the anus.They are often itchy and may crack and bleed. Sometimes clots will form in the veins. This makes them swollen and painful. These are called thrombosed hemorrhoids. CAUSES Causes of hemorrhoids include:  Pregnancy. This increases the pressure in the hemorrhoidal veins.   Constipation.   Straining to have a bowel movement.   Obesity.   Heavy lifting or other activity that caused you to strain.  TREATMENT Most of the time hemorrhoids improve in 1 to 2 weeks. However, if symptoms do not seem to be getting better or if you have a lot of rectal bleeding, your caregiver may perform a procedure to help make the hemorrhoids get smaller or remove them completely.Possible treatments include:  Rubber band ligation. A rubber band is placed at the base of the hemorrhoid to cut off the circulation.   Sclerotherapy. A chemical is injected to shrink the hemorrhoid.   Infrared light therapy.  Tools are used to burn the hemorrhoid.   Hemorrhoidectomy. This is surgical removal of the hemorrhoid.  HOME CARE INSTRUCTIONS   Increase fiber in your diet. Ask your caregiver about using fiber supplements.   Drink enough water and fluids to keep your urine clear or pale yellow.   Exercise regularly.   Go to the bathroom when you have the urge to have a bowel movement. Do not wait.   Avoid  straining to have bowel movements.   Keep the anal area dry and clean.   Only take over-the-counter or prescription medicines for pain, discomfort, or fever as directed by your caregiver.  If your hemorrhoids are thrombosed:  Take warm sitz baths for 20 to 30 minutes, 3 to 4 times per day.   If the hemorrhoids are very tender and swollen, place ice packs on the area as tolerated. Using ice packs between sitz baths may be helpful. Fill a plastic bag with ice. Place a towel between the bag of ice and your skin.   Medicated creams and suppositories may be used or applied as directed.   Do not use a donut-shaped pillow or sit on the toilet for long periods. This increases blood pooling and pain.  SEEK MEDICAL CARE IF:   You have increasing pain and swelling that is not controlled with your medicine.   You have uncontrolled bleeding.   You have difficulty or you are unable to have a bowel movement.   You have pain or inflammation outside the area of the hemorrhoids.   You have chills or an oral temperature above 102 F (38.9 C).  MAKE SURE YOU:   Understand these instructions.   Will watch your condition.   Will get help right away if you are not doing well or get worse.  Document Released: 12/25/1999 Document Revised: 12/16/2010 Document Reviewed: 05/01/2007 [facility] Patient Information 2012 [place], [place].

## 2011-08-30 NOTE — Progress Notes (Signed)
History of Present Illness:  This is a 78 year old Caucasian male with chronic constipation worse over the last few months with associated rectal bleeding one month ago at which time he was seen by her nurse practitioner. He was placed on lightly MiraLax, returns for followup. His constipation is improved companies had no further rectal bleeding. Last colonoscopy was 2005. Review of his labs shows no evidence of anemia or other metabolic abnormalities. Colonoscopy did reveal rather severe diverticulosis. The patient is on aspirin 81 mg a day. Family history is noncontributory.  I have reviewed this patient's present history, medical and surgical past history, allergies and medications.     ROS: The remainder of the 10 point ROS is negative... no upper GI symptoms but the patient is on daily Prilosec apparently for GERD.     Physical Exam: Blood pressure 112/66, pulse 76 and regular, and weight under 95 pounds the BMI of 28.83. General well developed well nourished patient in no acute distress, appearing their stated age Eyes PERRLA, no icterus, fundoscopic exam per opthamologist Skin no lesions noted Neck supple, no adenopathy, no thyroid enlargement, no tenderness Chest clear to percussion and auscultation Heart no significant murmurs, gallops or rubs noted Abdomen no hepatosplenomegaly masses or tenderness, BS normal.  Rectal inspection normal no fissures, or fistulae noted.  No masses or tenderness on digital exam. Stool guaiac negative. Extremities no acute joint lesions, edema, phlebitis or evidence of cellulitis. Neurologic patient oriented x 3, cranial nerves intact, no focal neurologic deficits noted. Psychological mental status normal and normal affect . ANOSCOPY: I cannot visualize any fissures or fistulae or perianal lesions. Anoscopic exam showed a posterior lateral in the left lateral internal hemorrhoid column without evidence of colitis, fistulae, but there were some stigmata  of recent bleeding. Digital exam otherwise was unremarkable.    Assessment and plan: Bleeding internal hemorrhoids associated with diverticulosis and chronic constipation. I placed him on a high fiber diet with daily Metamucil, liberal by mouth fluids, and when necessary MiraLax at bedtime. Also Anusol-HC suppositories to be used on a when necessary basis for hemorrhoidal bleeding. I have instructed him and IFOB stool cards. If these are positive he will need followup colonoscopy exam. Otherwise he is to continue medications as per primary care.  No diagnosis found.

## 2011-09-01 ENCOUNTER — Other Ambulatory Visit: Payer: Medicare Other

## 2011-09-01 DIAGNOSIS — K5900 Constipation, unspecified: Secondary | ICD-10-CM

## 2011-10-10 ENCOUNTER — Other Ambulatory Visit: Payer: Self-pay | Admitting: Internal Medicine

## 2011-11-03 ENCOUNTER — Ambulatory Visit (INDEPENDENT_AMBULATORY_CARE_PROVIDER_SITE_OTHER): Payer: Medicare Other

## 2011-11-03 DIAGNOSIS — Z23 Encounter for immunization: Secondary | ICD-10-CM

## 2012-01-13 ENCOUNTER — Telehealth: Payer: Self-pay | Admitting: Internal Medicine

## 2012-01-13 MED ORDER — SIMVASTATIN 40 MG PO TABS: ORAL_TABLET | ORAL | Status: DC

## 2012-01-13 NOTE — Telephone Encounter (Signed)
Rx sent 

## 2012-01-13 NOTE — Telephone Encounter (Signed)
Refill: simvastatin tab 40 mg. Take 1 by mouth at bedtime. 90 day supply

## 2012-01-16 ENCOUNTER — Other Ambulatory Visit: Payer: Self-pay | Admitting: Internal Medicine

## 2012-01-16 NOTE — Telephone Encounter (Signed)
Patient aware to stop by office to sign controlled substance contract and pick-up rx

## 2012-02-02 ENCOUNTER — Ambulatory Visit (INDEPENDENT_AMBULATORY_CARE_PROVIDER_SITE_OTHER): Payer: Medicare Other | Admitting: Internal Medicine

## 2012-02-02 ENCOUNTER — Encounter: Payer: Self-pay | Admitting: Internal Medicine

## 2012-02-02 VITALS — BP 122/70 | HR 65 | Temp 98.0°F | Resp 12 | Ht 70.08 in | Wt 198.0 lb

## 2012-02-02 DIAGNOSIS — E8881 Metabolic syndrome: Secondary | ICD-10-CM

## 2012-02-02 DIAGNOSIS — D126 Benign neoplasm of colon, unspecified: Secondary | ICD-10-CM

## 2012-02-02 DIAGNOSIS — R413 Other amnesia: Secondary | ICD-10-CM

## 2012-02-02 DIAGNOSIS — E785 Hyperlipidemia, unspecified: Secondary | ICD-10-CM

## 2012-02-02 DIAGNOSIS — Z0000 Encounter for general adult medical examination without abnormal findings: Secondary | ICD-10-CM

## 2012-02-02 DIAGNOSIS — Z23 Encounter for immunization: Secondary | ICD-10-CM

## 2012-02-02 DIAGNOSIS — R9431 Abnormal electrocardiogram [ECG] [EKG]: Secondary | ICD-10-CM

## 2012-02-02 LAB — HEPATIC FUNCTION PANEL
Albumin: 4.3 g/dL (ref 3.5–5.2)
Alkaline Phosphatase: 36 U/L — ABNORMAL LOW (ref 39–117)
Bilirubin, Direct: 0 mg/dL (ref 0.0–0.3)
Total Protein: 7.1 g/dL (ref 6.0–8.3)

## 2012-02-02 LAB — CBC WITH DIFFERENTIAL/PLATELET
Basophils Absolute: 0 10*3/uL (ref 0.0–0.1)
Eosinophils Absolute: 0 10*3/uL (ref 0.0–0.7)
Hemoglobin: 14.6 g/dL (ref 13.0–17.0)
Lymphocytes Relative: 35.8 % (ref 12.0–46.0)
MCHC: 33.6 g/dL (ref 30.0–36.0)
Monocytes Relative: 6.9 % (ref 3.0–12.0)
Neutro Abs: 3 10*3/uL (ref 1.4–7.7)
Platelets: 148 10*3/uL — ABNORMAL LOW (ref 150.0–400.0)
RDW: 13.1 % (ref 11.5–14.6)

## 2012-02-02 LAB — LIPID PANEL
HDL: 61.5 mg/dL (ref >39.00)
Total CHOL/HDL Ratio: 3
Triglycerides: 110 mg/dL (ref 0.0–149.0)
VLDL: 22 mg/dL (ref 0.0–40.0)

## 2012-02-02 LAB — BASIC METABOLIC PANEL
BUN: 18 mg/dL (ref 6–23)
CO2: 29 mEq/L (ref 19–32)
Calcium: 9.1 mg/dL (ref 8.4–10.5)
Creatinine, Ser: 1.6 mg/dL — ABNORMAL HIGH (ref 0.4–1.5)
GFR: 46.08 mL/min — ABNORMAL LOW (ref >60.00)
Glucose, Bld: 89 mg/dL (ref 70–99)
Sodium: 138 mEq/L (ref 135–145)

## 2012-02-02 LAB — HEMOGLOBIN A1C: Hgb A1c MFr Bld: 5.9 % (ref 4.6–6.5)

## 2012-02-02 NOTE — Progress Notes (Signed)
Subjective:    Patient ID: Matthew [name], male    DOB: Jul 26, 1930, 79 y.o.   MRN: [id number]  HPI Medicare Wellness Visit:  Psychosocial & medical history were reviewed as required by Medicare (abuse,antisocial behavioral risks,firearm risk).  Social history: caffeine:minimal  , alcohol: no  ,  tobacco use: no   Exercise :goes to  [place] 3X/ week No home & personal  safety / fall risk Activities of daily living: no limitations  Seatbelt  and smoke alarm employed. Power of Attorney/Living Will status : in place Ophthalmology exam current Hearing aids not worn Orientation :01/29/12; knew President  Memory & recall : 0 of 3 ; 1 of 3 on repeat testing Spelling  Testing:correct  Mood & affect : normal . Depression / anxiety: denied Travel history : [place] ? year  Immunization status : tetanus given Transfusion history:  none  Preventive health surveillance ( colonoscopies, BMD , mammograms,PAP as per protocol/ [facility]): current  Dental care:  Every 6 mos. Chart reviewed &  Updated. Active issues reviewed & addressed.       Review of Systems  PMH of FASTING HYPERGLYCEMIA : Disease Monitoring:no monitor Medication Compliance: no diabetic meds Diet: low  carb   HYPERLIPIDEMIA: Diet & exercise as noted Medication Compliance:  Statin taken  FH premature CAD / CVA :  no (updated & recorded in  FH)         Chest pain, palpitations , claudications, PNDyspnea: no     Exertional dyspnea:no Lightheadedness,Syncope: no Edema:no Polyuria/phagia/dipsia :no Numbness & tingling :burning in feet Non healing skin lesions: no   Blurred , double or loss of vision:no Abd pain, bowel changes:no  Myalgias:no             Objective:   Physical Exam Gen.: well-nourished in appearance. Alert, appropriate and cooperative throughout exam. Appears younger than stated age  Head: Normocephalic without obvious abnormalities;  pattern alopecia  Eyes: No corneal or conjunctival inflammation noted.   Extraocular motion intact. Vision grossly normal. Ears: External  ear exam reveals no significant lesions or deformities. Canals clear .TMs normal. Hearing is grossly decreased bilaterally. Nose: External nasal exam reveals no deformity or inflammation. Nasal mucosa are pink and moist. No lesions or exudates noted.  Mouth: Oral mucosa and oropharynx reveal no lesions or exudates. Teeth in good repair. Neck: No deformities, masses, or tenderness noted. Range of motion decreased. Thyroid normal Lungs: Normal respiratory effort; chest expands symmetrically. Lungs are clear to auscultation without rales, wheezes, or increased work of breathing. Heart: Normal rate and rhythm. Normal S1 and S2. No gallop, click, or rub. S4 w/o murmur. Abdomen: Bowel sounds normal; abdomen soft and nontender. No masses, organomegaly or hernias noted. Genitalia: [facility]                                    Musculoskeletal/extremities: There is some asymmetry of the posterior thoracic musculature suggesting occult scoliosis.  No clubbing, cyanosis, or edema noted. Range of motion decreased @ knees ; crepitus L > R .Tone & strength  normal.Joints   DJD hand changes. Nail health good. Able to lie down & sit up w/o help. Negative SLR bilaterally Vascular: Carotid, radial artery, dorsalis pedis and  posterior tibial pulses are full and equal. No bruits present. Neurologic: Alert and oriented x3. Deep tendon reflexes symmetrical but 1/2 + @ knees.  Skin: Intact without suspicious lesions or rashes. Solar changes  Lymph: No cervical, axillary lymphadenopathy present. Psych: Mood and affect are normal. Normally interactive                                                                                         Assessment & Plan:  #1 Medicare Wellness Exam; criteria met ; data entered #2 Problem List reviewed ; Assessment/ Recommendations made #3 progressive ST-T changes; changes in leads 1, aVL, and the 5-6 are stable.  There is new T-wave inversion in leads V3-4. Plan: see Orders

## 2012-02-02 NOTE — Patient Instructions (Addendum)
Take the EKG to any emergency room or preop visits. There are nonspecific changes; as long as there is no new change these are not clinically significant . If the old EKG is not available for comparison; it may result in unnecessary hospitalization for observation with significant unnecessary expense.  Review and correct the record as indicated. Please share record with all medical staff seen.

## 2012-02-03 ENCOUNTER — Encounter: Payer: Self-pay | Admitting: Internal Medicine

## 2012-02-16 ENCOUNTER — Encounter: Payer: Self-pay | Admitting: Cardiology

## 2012-02-16 ENCOUNTER — Ambulatory Visit (INDEPENDENT_AMBULATORY_CARE_PROVIDER_SITE_OTHER): Payer: Medicare Other | Admitting: Cardiology

## 2012-02-16 VITALS — BP 135/77 | HR 70 | Ht 70.0 in | Wt 197.1 lb

## 2012-02-16 DIAGNOSIS — R9431 Abnormal electrocardiogram [ECG] [EKG]: Secondary | ICD-10-CM | POA: Insufficient documentation

## 2012-02-16 DIAGNOSIS — I739 Peripheral vascular disease, unspecified: Secondary | ICD-10-CM

## 2012-02-16 NOTE — Patient Instructions (Addendum)
The current medical regimen is effective;  continue present plan and medications.  Your physician has requested that you have a lexiscan myoview. For further information please visit [email or web address]. Please follow instruction sheet, as given.  Follow up will be based on these results 

## 2012-02-16 NOTE — Progress Notes (Signed)
HPI The patient presents for evaluation of an abnormal EKG. He was noted to have T-wave inversions in his anterior leads this year during routine exam. He has not had this on his prior EKG. He is actually quite active. He denies any neck or arm discomfort.  However, he will occasionally get some chest pressure at peak exercise. He somewhat vague about this. He doesn't quantify or qualify this. He doesn't report associated symptoms. He's not sure how long this has been going on or whether it is new. He has no PND or orthopnea. He denies any palpitations, presyncope or syncope. He has no weight gain or edema.  No Known Allergies  Current Outpatient Prescriptions  Medication Sig Dispense Refill  . aspirin 81 MG tablet Take 81 mg by mouth daily.        [name] omeprazole (PRILOSEC) 20 MG capsule Take 1 capsule (20 mg total) by mouth daily.  90 capsule  2  . polyethylene glycol (MIRALAX / GLYCOLAX) packet Take 17 g by mouth daily.      . psyllium (METAMUCIL) 58.6 % packet Take 1 packet by mouth daily.      . simvastatin (ZOCOR) 40 MG tablet TAKE 1 TABLET AT BEDTIME WHEN VYTORIN COMPLETED (LABS DUE 10/2010)  90 tablet  0  . traMADol (ULTRAM) 50 MG tablet TAKE 1 TABLET BY MOUTH TWICE A DAY AS NEEDED  60 tablet  1    Past Medical History  Diagnosis Date  . Arthritis   . Anxiety   . Hypertension   . Hyperlipidemia   . Chronic kidney disease   . Prostate cancer   . Cataract     NOS  . Impotence of organic origin   . PVD (peripheral vascular disease)   . Metabolic syndrome X   . Dysphagia     PMH of  . Diverticulosis of colon   . Hemorrhoids, internal   . Hiatal hernia   . Skin cancer   . History of colon polyps     hyperplastic; Dr Jarold Motto    Past Surgical History  Procedure Date  . Appendectomy 1950  . Cervical fusion   . Colonoscopy w/ polypectomy     Dr Jarold Motto  . Prostatectomy 1995    Dr.[name]; no chemo or radiation  . Tonsillectomy   . Shoulder surgery     right     Family History  Problem Relation Age of Onset  . Lung cancer Father     smoker  . Throat cancer Father   . Kidney disease Father     nephrectomy & partial nephrectomy  . Breast cancer Mother   . Dementia Mother   . Heart attack Mother 49  . Prostate cancer Brother   . Diabetes Maternal Grandmother   . Heart attack Maternal Grandmother     in 3s  . Heart disease Brother     x 3  . Stroke Neg Hx     History   Social History  . Marital Status: Married    Spouse Name: N/A    Number of Children: 4  . Years of Education: N/A   Occupational History  . Retired    Social History Main Topics  . Smoking status: Never Smoker   . Smokeless tobacco: Never Used  . Alcohol Use: No  . Drug Use: No  . Sexually Active: Not on file   Other Topics Concern  . Not on file   Social History Narrative  . No narrative on file  ROS:  Positive for tinnitus, decreased hearing, urinary incontinence, constipation, joint pains. .Otherwise as stated in the HPI and negative for all other systems.  PHYSICAL EXAM BP 135/77  Pulse 70  Ht 5\' 10"  (1.778 m)  Wt 197 lb 1.9 oz (89.413 kg)  BMI 28.28 kg/m2  SpO2 93% GENERAL:  Well appearing HEENT:  Pupils equal round and reactive, fundi not visualized, oral mucosa unremarkable NECK:  No jugular venous distention, waveform within normal limits, carotid upstroke brisk and symmetric, no bruits, no thyromegaly LYMPHATICS:  No cervical, inguinal adenopathy LUNGS:  Clear to auscultation bilaterally BACK:  No CVA tenderness CHEST:  Unremarkable HEART:  PMI not displaced or sustained,S1 and S2 within normal limits, no S3, no S4, no clicks, no rubs, slight apical systolic murmur radiating slightly out the aortic outflow tract, no diastolic murmurs ABD:  Flat, positive bowel sounds normal in frequency in pitch, no bruits, no rebound, no guarding, no midline pulsatile mass, no hepatomegaly, no splenomegaly EXT:  2 plus pulses throughout, no edema, no  cyanosis no clubbing SKIN:  No rashes no nodules NEURO:  Cranial nerves II through XII grossly intact, motor grossly intact throughout [facility]:  Cognitively intact, oriented to person place and time  EKG:  Sinus rhythm, rate 57, axis within  normal limits, , intervals within normal limits, anterior T-wave inversions new compared to previous EKG possibly consistent with ischemia, minimal LVH voltage criteria, early transition in lead V2. 02/16/2012  ASSESSMENT AND PLAN  Abnormal EKG Given the abnormal EKG and symptoms the patient will need stress testing. With the pretest probability of obstructive coronary disease being at least moderate I will order a stress perfusion imaging.  Murmur - The patient has a slight systolic murmur which I suggest is likely aortic sclerosis although I couldn't absolutely exclude a sub valvular obstruction. Regardless I don't think is clinically significant and this can be followed up with physical exam in the future.

## 2012-02-22 ENCOUNTER — Ambulatory Visit (HOSPITAL_COMMUNITY): Payer: Medicare Other | Attending: Cardiovascular Disease | Admitting: Radiology

## 2012-02-22 ENCOUNTER — Encounter (HOSPITAL_COMMUNITY): Payer: Medicare Other

## 2012-02-22 VITALS — BP 137/79 | HR 49 | Ht 70.0 in | Wt 192.0 lb

## 2012-02-22 DIAGNOSIS — Z8249 Family history of ischemic heart disease and other diseases of the circulatory system: Secondary | ICD-10-CM | POA: Insufficient documentation

## 2012-02-22 DIAGNOSIS — R5381 Other malaise: Secondary | ICD-10-CM | POA: Insufficient documentation

## 2012-02-22 DIAGNOSIS — R5383 Other fatigue: Secondary | ICD-10-CM | POA: Insufficient documentation

## 2012-02-22 DIAGNOSIS — R9439 Abnormal result of other cardiovascular function study: Secondary | ICD-10-CM

## 2012-02-22 DIAGNOSIS — R9431 Abnormal electrocardiogram [ECG] [EKG]: Secondary | ICD-10-CM

## 2012-02-22 DIAGNOSIS — I10 Essential (primary) hypertension: Secondary | ICD-10-CM | POA: Insufficient documentation

## 2012-02-22 DIAGNOSIS — I359 Nonrheumatic aortic valve disorder, unspecified: Secondary | ICD-10-CM | POA: Insufficient documentation

## 2012-02-22 DIAGNOSIS — R0789 Other chest pain: Secondary | ICD-10-CM | POA: Insufficient documentation

## 2012-02-22 DIAGNOSIS — E785 Hyperlipidemia, unspecified: Secondary | ICD-10-CM | POA: Insufficient documentation

## 2012-02-22 DIAGNOSIS — R079 Chest pain, unspecified: Secondary | ICD-10-CM

## 2012-02-22 MED ORDER — TECHNETIUM TC 99M SESTAMIBI GENERIC - CARDIOLITE
10.0000 | Freq: Once | INTRAVENOUS | Status: AC | PRN
  Administered 2012-02-22: 10 via INTRAVENOUS

## 2012-02-22 MED ORDER — TECHNETIUM TC 99M SESTAMIBI GENERIC - CARDIOLITE
30.0000 | Freq: Once | INTRAVENOUS | Status: AC | PRN
  Administered 2012-02-22: 30 via INTRAVENOUS

## 2012-02-22 NOTE — Progress Notes (Signed)
[facility] SITE 3 NUCLEAR MED [address] [place], [place] [address] [phone]    Cardiology Nuclear Med Study  Matthew [name] is a 79 y.o. male     MRN : [id number]     DOB: 1930/12/14  Procedure Date: 02/22/2012  Nuclear Med Background Indication for Stress Test:  Evaluation for Ischemia and Abnormal EKG History:  '96 Cath:normal coronaries; '07 OZH:YQMVHQ, EF=68%; '07 Echo:normal LVF, mild-moderate calcified aortic valve Cardiac Risk Factors: Family History - CAD, Hypertension and Lipids  Symptoms:  Chest Pressure with Exertion (last episode of chest discomfort was Monday) and Fatigue   Nuclear Pre-Procedure Caffeine/Decaff Intake:  None > 12 hrs NPO After: 10:00pm   Lungs:  Clear. O2 Sat: 98% on room air. IV 0.9% NS with Angio Cath:  20g  IV Site: R Antecubital x 1, tolerated well IV Started by:  Irean Hong, RN  Chest Size (in):  38 Cup Size: n/a  Height: 5\' 10"  (1.778 m)  Weight:  192 lb (87.091 kg)  BMI:  Body mass index is 27.55 kg/(m^2). Tech Comments:  n/a    Nuclear Med Study 1 or 2 day study: 1 day  Stress Test Type:  Stress  Reading MD: Charlton Haws, MD  Order Authorizing Provider:  Rollene Rotunda, MD  Resting Radionuclide: Technetium 47m Sestamibi  Resting Radionuclide Dose: 11.0 mCi   Stress Radionuclide:  Technetium 39m Sestamibi  Stress Radionuclide Dose: 33.0 mCi           Stress Protocol Rest HR: 49 Stress HR: 133  Rest BP: 137/79 Stress BP: 209/82  Exercise Time (min): 9:00 METS: 10.1   Predicted Max HR: 140 bpm % Max HR: 95 bpm Rate Pressure Product: 46962   Dose of Adenosine (mg):  n/a Dose of Lexiscan: n/a mg  Dose of Atropine (mg): n/a Dose of Dobutamine: n/a mcg/kg/min (at max HR)  Stress Test Technologist: Smiley Houseman, CMA-N  Nuclear Technologist:  Domenic Polite, CNMT     Rest Procedure:  Myocardial perfusion imaging was performed at rest 45 minutes following the intravenous administration of Technetium 21m  Sestamibi.  Rest ECG: NSR - Normal EKG  Stress Procedure:  The patient exercised on the treadmill utilizing the Bruce Protocol for nine minutes. The patient stopped due to fatigue.  He did c/o chest pressure with exercise.  Technetium 94m Sestamibi was injected at peak exercise and myocardial perfusion imaging was performed after a brief delay.  Stress ECG: 2 mm ST segment depression in lateral leads  QPS Raw Data Images:  Patient motion noted. Stress Images:  Normal homogeneous uptake in all areas of the myocardium. Rest Images:  Normal homogeneous uptake in all areas of the myocardium. Subtraction (SDS):  Normal Transient Ischemic Dilatation (Normal <1.22):  0.97 Lung/Heart Ratio (Normal <0.45):  0.36  Quantitative Gated Spect Images QGS EDV:  78 ml QGS ESV:  30 ml  Impression Exercise Capacity:  Good exercise capacity. BP Response:  Hypertensive blood pressure response. Clinical Symptoms:  Significant chest pain ECG Impression:  Significant ST abnormalities consistent with ischemia. Comparison with Prior Nuclear Study: No images to compare  Overall Impression:  Intermediate stress nuclear study. Nuclear images are normal However patient had SSCP consistent with angina In the setting of 2  Mm of horizontal ST segment depression in lateral leads.  Clinical correlation suggested for further w/u  LV Ejection Fraction: 62%.  LV Wall Motion:  NL LV Function; NL Wall Motion   Charlton Haws

## 2012-02-28 ENCOUNTER — Encounter (HOSPITAL_COMMUNITY): Payer: Medicare Other

## 2012-03-01 ENCOUNTER — Ambulatory Visit (INDEPENDENT_AMBULATORY_CARE_PROVIDER_SITE_OTHER): Payer: Medicare Other | Admitting: Cardiology

## 2012-03-01 ENCOUNTER — Encounter: Payer: Self-pay | Admitting: *Deleted

## 2012-03-01 ENCOUNTER — Encounter: Payer: Self-pay | Admitting: Cardiology

## 2012-03-01 VITALS — BP 128/66 | HR 70 | Ht 70.0 in | Wt 196.0 lb

## 2012-03-01 DIAGNOSIS — Z01810 Encounter for preprocedural cardiovascular examination: Secondary | ICD-10-CM

## 2012-03-01 DIAGNOSIS — R9431 Abnormal electrocardiogram [ECG] [EKG]: Secondary | ICD-10-CM

## 2012-03-01 DIAGNOSIS — I739 Peripheral vascular disease, unspecified: Secondary | ICD-10-CM

## 2012-03-01 LAB — BASIC METABOLIC PANEL
BUN: 18 mg/dL (ref 6–23)
Creatinine, Ser: 1.7 mg/dL — ABNORMAL HIGH (ref 0.4–1.5)
GFR: 41.41 mL/min — ABNORMAL LOW (ref >60.00)

## 2012-03-01 LAB — PROTIME-INR
INR: 1.1 ratio — ABNORMAL HIGH (ref 0.8–1.0)
Prothrombin Time: 11.4 s (ref 10.2–12.4)

## 2012-03-01 LAB — CBC
Hemoglobin: 13.8 g/dL (ref 13.0–17.0)
MCHC: 34.3 g/dL (ref 30.0–36.0)
MCV: 89.1 fl (ref 78.0–100.0)
Platelets: 153 10*3/uL (ref 150.0–400.0)
RDW: 12.9 % (ref 11.5–14.6)

## 2012-03-01 NOTE — Progress Notes (Signed)
 HPI The patient presents for evaluation of an abnormal EKG. He was noted to have T-wave inversions in his anterior leads this year during routine exam. He didn't describe some atypical chest discomfort and I sent him for a stress perfusion study. This demonstrated no evidence of ischemia on images. However, he did have 2 mm horizontal ST changes in the lateral leads. He had chest pain with exertion. I brought him back to discuss this today. He said he's had chest discomfort again with exertion while pushing his granddaughter in a toy car. He's been noticing this discomfort with exertion but not at rest.  He thinks it is getting worse. He says it goes away after he rests. He describes a midsternal discomfort that may be worse with a deep breath or moving. He denies any neck or arm discomfort. He's had no palpitations, presyncope or syncope. He has been having some increasing dyspnea.  No Known Allergies  Current Outpatient Prescriptions  Medication Sig Dispense Refill  . aspirin 81 MG tablet Take 81 mg by mouth daily.        . omeprazole (PRILOSEC) 20 MG capsule Take 1 capsule (20 mg total) by mouth daily.  90 capsule  2  . polyethylene glycol (MIRALAX / GLYCOLAX) packet Take 17 g by mouth daily.      . psyllium (METAMUCIL) 58.6 % packet Take 1 packet by mouth daily.      . simvastatin (ZOCOR) 40 MG tablet TAKE 1 TABLET AT BEDTIME WHEN VYTORIN COMPLETED (LABS DUE 10/2010)  90 tablet  0  . traMADol (ULTRAM) 50 MG tablet TAKE 1 TABLET BY MOUTH TWICE A DAY AS NEEDED  60 tablet  1   No current facility-administered medications for this visit.    Past Medical History  Diagnosis Date  . Arthritis   . Anxiety   . Hypertension   . Hyperlipidemia   . Chronic kidney disease   . Prostate cancer   . Cataract     NOS  . Impotence of organic origin   . PVD (peripheral vascular disease)   . Metabolic syndrome X   . Dysphagia     PMH of  . Diverticulosis of colon   . Hemorrhoids, internal   .  Hiatal hernia   . Skin cancer   . History of colon polyps     hyperplastic; Dr Patterson    Past Surgical History  Procedure Laterality Date  . Appendectomy  1950  . Cervical fusion    . Colonoscopy w/ polypectomy      Dr Patterson  . Prostatectomy  1995    Dr.[name]; no chemo or radiation  . Tonsillectomy    . Shoulder surgery      right    Family History  Problem Relation Age of Onset  . Lung cancer Father     smoker  . Throat cancer Father   . Kidney disease Father     nephrectomy & partial nephrectomy  . Breast cancer Mother   . Dementia Mother   . Heart attack Mother 93  . Prostate cancer Brother   . Diabetes Maternal Grandmother   . Heart attack Maternal Grandmother     in 80s  . Heart disease Brother     x 3 (one brother with CAD in the 40s, another age 50s, third one 70s CABG)  . Stroke Neg Hx     History   Social History  . Marital Status: Married    Spouse Name: N/A      Number of Children: 4  . Years of Education: N/A   Occupational History  . Retired    Social History Main Topics  . Smoking status: Never Smoker   . Smokeless tobacco: Never Used  . Alcohol Use: No  . Drug Use: No  . Sexually Active: Not on file   Other Topics Concern  . Not on file   Social History Narrative   Lives at home with wife.      ROS:  As stated in the HPI otherwise negative for all other systems.  PHYSICAL EXAM BP 128/66  Pulse 70  Ht 5' 10" (1.778 m)  Wt 196 lb (88.905 kg)  BMI 28.12 kg/m2  SpO2 98% GENERAL:  Well appearing HEENT:  Pupils equal round and reactive, fundi not visualized, oral mucosa unremarkable NECK:  No jugular venous distention, waveform within normal limits, carotid upstroke brisk and symmetric, no bruits, no thyromegaly LYMPHATICS:  No cervical, inguinal adenopathy LUNGS:  Clear to auscultation bilaterally BACK:  No CVA tenderness CHEST:  Unremarkable HEART:  PMI not displaced or sustained,S1 and S2 within normal limits, no S3,  no S4, no clicks, no rubs, slight apical systolic murmur radiating slightly out the aortic outflow tract, no diastolic murmurs ABD:  Flat, positive bowel sounds normal in frequency in pitch, no bruits, no rebound, no guarding, no midline pulsatile mass, no hepatomegaly, no splenomegaly EXT:  2 plus pulses throughout, no edema, no cyanosis no clubbing SKIN:  No rashes no nodules NEURO:  Cranial nerves II through XII grossly intact, motor grossly intact throughout PSYCH:  Cognitively intact, oriented to person place and time  EKG:  Sinus rhythm, rate 65, axis within normal limits, intervals within normal limits, nonspecific lateral T-wave flattening with previous T-wave inversions resolved. 03/01/2012  ASSESSMENT AND PLAN  Abnormal Stress Test The patient is continuing to have chest discomfort particularly with exertion. He had an abnormal EKG. Despite the fact that his perfusion images were normal he is EKG demonstrated ST segment changes and he had chest pain during exercise. Therefore, the pretest probability of obstructive coronary disease is very high. Cardiac catheterization is indicated. The patient understands that risks included but are not limited to stroke (1 in 1000), death (1 in 1000), kidney failure [usually temporary] (1 in 500), bleeding (1 in 200), allergic reaction [possibly serious] (1 in 200).  The patient understands and agrees to proceed.   Murmur - This will be further evaluated at the time of his catheterization. I suspect aortic sclerosis  CKD - He has had some mild renal insufficiency and I will be checking a creatinine today. If his creatinine is elevated above previous to hold off on catheterization and consider hydration first.  HTN - His blood pressure is well controlled. He will continue the meds as listed.  

## 2012-03-01 NOTE — Patient Instructions (Addendum)
The current medical regimen is effective;  continue present plan and medications.  Your physician has requested that you have a cardiac catheterization. Cardiac catheterization is used to diagnose and/or treat various heart conditions. Doctors may recommend this procedure for a number of different reasons. The most common reason is to evaluate chest pain. Chest pain can be a symptom of coronary artery disease (CAD), and cardiac catheterization can show whether plaque is narrowing or blocking your heart's arteries. This procedure is also used to evaluate the valves, as well as measure the blood flow and oxygen levels in different parts of your heart. For further information please visit [email or web address] Please follow instruction sheet, as given.  Further follow up will be scheduled after your catheterization.

## 2012-03-02 ENCOUNTER — Ambulatory Visit (HOSPITAL_COMMUNITY)
Admission: RE | Admit: 2012-03-02 | Discharge: 2012-03-02 | Disposition: A | Discharge status: Home / Self Care | Payer: Medicare Other | Source: Ambulatory Visit | Attending: Cardiology | Admitting: Cardiology

## 2012-03-02 ENCOUNTER — Encounter (HOSPITAL_COMMUNITY)
Admission: RE | Disposition: A | Discharge status: Home / Self Care | Payer: Self-pay | Source: Ambulatory Visit | Attending: Cardiology

## 2012-03-02 DIAGNOSIS — I129 Hypertensive chronic kidney disease with stage 1 through stage 4 chronic kidney disease, or unspecified chronic kidney disease: Secondary | ICD-10-CM | POA: Insufficient documentation

## 2012-03-02 DIAGNOSIS — Z8546 Personal history of malignant neoplasm of prostate: Secondary | ICD-10-CM | POA: Insufficient documentation

## 2012-03-02 DIAGNOSIS — R079 Chest pain, unspecified: Secondary | ICD-10-CM

## 2012-03-02 DIAGNOSIS — E8881 Metabolic syndrome: Secondary | ICD-10-CM | POA: Insufficient documentation

## 2012-03-02 DIAGNOSIS — N189 Chronic kidney disease, unspecified: Secondary | ICD-10-CM | POA: Insufficient documentation

## 2012-03-02 DIAGNOSIS — R9439 Abnormal result of other cardiovascular function study: Secondary | ICD-10-CM | POA: Insufficient documentation

## 2012-03-02 DIAGNOSIS — I2510 Atherosclerotic heart disease of native coronary artery without angina pectoris: Secondary | ICD-10-CM | POA: Insufficient documentation

## 2012-03-02 HISTORY — PX: LEFT HEART CATHETERIZATION WITH CORONARY ANGIOGRAM: SHX5451

## 2012-03-02 SURGERY — LEFT HEART CATHETERIZATION WITH CORONARY ANGIOGRAM
Anesthesia: LOCAL

## 2012-03-02 MED ORDER — LIDOCAINE HCL (PF) 1 % IJ SOLN
INTRAMUSCULAR | Status: AC
  Filled 2012-03-02: qty 30

## 2012-03-02 MED ORDER — ACETAMINOPHEN 325 MG PO TABS: 650.0000 mg | ORAL_TABLET | ORAL | Status: DC | PRN

## 2012-03-02 MED ORDER — FENTANYL CITRATE 0.05 MG/ML IJ SOLN
INTRAMUSCULAR | Status: AC
Start: 2012-03-02 — End: 2012-03-02
  Filled 2012-03-02: qty 2

## 2012-03-02 MED ORDER — SODIUM CHLORIDE 0.9 % IV SOLN: 250.0000 mL | INTRAVENOUS | Status: DC | PRN

## 2012-03-02 MED ORDER — ONDANSETRON HCL 4 MG/2ML IJ SOLN: 4.0000 mg | Freq: Four times a day (QID) | INTRAMUSCULAR | Status: DC | PRN

## 2012-03-02 MED ORDER — SODIUM CHLORIDE 0.9 % IJ SOLN: 3.0000 mL | Freq: Two times a day (BID) | INTRAMUSCULAR | Status: DC

## 2012-03-02 MED ORDER — SODIUM CHLORIDE 0.9 % IV SOLN
INTRAVENOUS | Status: DC
  Administered 2012-03-02: 08:00:00 via INTRAVENOUS

## 2012-03-02 MED ORDER — MIDAZOLAM HCL 2 MG/2ML IJ SOLN
INTRAMUSCULAR | Status: AC
  Filled 2012-03-02: qty 2

## 2012-03-02 MED ORDER — SODIUM CHLORIDE 0.9 % IJ SOLN: 3.0000 mL | INTRAMUSCULAR | Status: DC | PRN

## 2012-03-02 MED ORDER — SODIUM CHLORIDE 0.9 % IV SOLN: INTRAVENOUS | Status: DC

## 2012-03-02 MED ORDER — HEPARIN (PORCINE) IN NACL 2-0.9 UNIT/ML-% IJ SOLN
INTRAMUSCULAR | Status: AC
  Filled 2012-03-02: qty 1000

## 2012-03-02 MED ORDER — ASPIRIN 81 MG PO CHEW: 324.0000 mg | CHEWABLE_TABLET | ORAL | Status: DC

## 2012-03-02 NOTE — CV Procedure (Signed)
   Cardiac Catheterization Procedure Note  Name: GLENWOOD REVOIR MRN: [id number] DOB: 05/01/1930  Procedure: Left Heart Cath, Selective Coronary Angiography  Indication: Chest pain, abnormal stress test.    Procedural Details: Patient was hydrated for 6 hours prior to procedure.  Allen's test was positive on the right.  The right wrist was prepped, draped, and anesthetized with 1% lidocaine. Using the modified Seldinger technique, a 5 French sheath was introduced into the right radial artery. 3 mg of verapamil was administered through the sheath, weight-based unfractionated heparin was administered intravenously. Standard Judkins catheters were used for selective coronary angiography. Catheter exchanges were performed over an exchange length guidewire. There were no immediate procedural complications. A TR band was used for radial hemostasis at the completion of the procedure.  The patient was transferred to the post catheterization recovery area for further monitoring.  Procedural Findings: Hemodynamics: AO 157/75 LV 162/12  Coronary angiography: Coronary dominance: right  Left mainstem: No significant disease.   Left anterior descending (LAD): Minor luminal irregularities.  Left circumflex (LCx): Small ramus, large OM1, small AV LCx beyond OM1.  Luminal irregularities.   Right coronary artery (RCA): Luminal irregularities.   Left ventriculography: Not done due to CKD.  45 cc contrast used for this study.   Final Conclusions:  Mild nonobstructive CAD.  ST depression on exercise ECG may be due to microvascular disease.  Continue hydration post-cath and home tonight.  Followup with Dr. Antoine Poche.   Marca Ancona 03/02/2012, 3:59 PM

## 2012-03-02 NOTE — Interval H&P Note (Signed)
History and Physical Interval Note:  03/02/2012 3:19 PM  Matthew [name]  has presented today for surgery, with the diagnosis of cp  The various methods of treatment have been discussed with the patient and family. After consideration of risks, benefits and other options for treatment, the patient has consented to  Procedure(s): LEFT HEART CATHETERIZATION WITH CORONARY ANGIOGRAM (N/A) as a surgical intervention .  The patient's history has been reviewed, patient examined, no change in status, stable for surgery.  I have reviewed the patient's chart and labs.  Questions were answered to the patient's satisfaction.     Phebe Dettmer [employer]

## 2012-03-02 NOTE — H&P (View-Only) (Signed)
HPI The patient presents for evaluation of an abnormal EKG. He was noted to have T-wave inversions in his anterior leads this year during routine exam. He didn't describe some atypical chest discomfort and I sent him for a stress perfusion study. This demonstrated no evidence of ischemia on images. However, he did have 2 mm horizontal ST changes in the lateral leads. He had chest pain with exertion. I brought him back to discuss this today. He said he's had chest discomfort again with exertion while pushing his granddaughter in a toy car. He's been noticing this discomfort with exertion but not at rest.  He thinks it is getting worse. He says it goes away after he rests. He describes a midsternal discomfort that may be worse with a deep breath or moving. He denies any neck or arm discomfort. He's had no palpitations, presyncope or syncope. He has been having some increasing dyspnea.  No Known Allergies  Current Outpatient Prescriptions  Medication Sig Dispense Refill  . aspirin 81 MG tablet Take 81 mg by mouth daily.        [name] omeprazole (PRILOSEC) 20 MG capsule Take 1 capsule (20 mg total) by mouth daily.  90 capsule  2  . polyethylene glycol (MIRALAX / GLYCOLAX) packet Take 17 g by mouth daily.      . psyllium (METAMUCIL) 58.6 % packet Take 1 packet by mouth daily.      . simvastatin (ZOCOR) 40 MG tablet TAKE 1 TABLET AT BEDTIME WHEN VYTORIN COMPLETED (LABS DUE 10/2010)  90 tablet  0  . traMADol (ULTRAM) 50 MG tablet TAKE 1 TABLET BY MOUTH TWICE A DAY AS NEEDED  60 tablet  1   No current facility-administered medications for this visit.    Past Medical History  Diagnosis Date  . Arthritis   . Anxiety   . Hypertension   . Hyperlipidemia   . Chronic kidney disease   . Prostate cancer   . Cataract     NOS  . Impotence of organic origin   . PVD (peripheral vascular disease)   . Metabolic syndrome X   . Dysphagia     PMH of  . Diverticulosis of colon   . Hemorrhoids, internal   .  Hiatal hernia   . Skin cancer   . History of colon polyps     hyperplastic; Dr Jarold Motto    Past Surgical History  Procedure Laterality Date  . Appendectomy  1950  . Cervical fusion    . Colonoscopy w/ polypectomy      Dr Jarold Motto  . Prostatectomy  1995    Dr.[name]; no chemo or radiation  . Tonsillectomy    . Shoulder surgery      right    Family History  Problem Relation Age of Onset  . Lung cancer Father     smoker  . Throat cancer Father   . Kidney disease Father     nephrectomy & partial nephrectomy  . Breast cancer Mother   . Dementia Mother   . Heart attack Mother 49  . Prostate cancer Brother   . Diabetes Maternal Grandmother   . Heart attack Maternal Grandmother     in 76s  . Heart disease Brother     x 3 (one brother with CAD in the 39s, another age 35s, third one 71s CABG)  . Stroke Neg Hx     History   Social History  . Marital Status: Married    Spouse Name: N/A  Number of Children: 4  . Years of Education: N/A   Occupational History  . Retired    Social History Main Topics  . Smoking status: Never Smoker   . Smokeless tobacco: Never Used  . Alcohol Use: No  . Drug Use: No  . Sexually Active: Not on file   Other Topics Concern  . Not on file   Social History Narrative   Lives at home with wife.      ROS:  As stated in the HPI otherwise negative for all other systems.  PHYSICAL EXAM BP 128/66  Pulse 70  Ht 5\' 10"  (1.778 m)  Wt 196 lb (88.905 kg)  BMI 28.12 kg/m2  SpO2 98% GENERAL:  Well appearing HEENT:  Pupils equal round and reactive, fundi not visualized, oral mucosa unremarkable NECK:  No jugular venous distention, waveform within normal limits, carotid upstroke brisk and symmetric, no bruits, no thyromegaly LYMPHATICS:  No cervical, inguinal adenopathy LUNGS:  Clear to auscultation bilaterally BACK:  No CVA tenderness CHEST:  Unremarkable HEART:  PMI not displaced or sustained,S1 and S2 within normal limits, no S3,  no S4, no clicks, no rubs, slight apical systolic murmur radiating slightly out the aortic outflow tract, no diastolic murmurs ABD:  Flat, positive bowel sounds normal in frequency in pitch, no bruits, no rebound, no guarding, no midline pulsatile mass, no hepatomegaly, no splenomegaly EXT:  2 plus pulses throughout, no edema, no cyanosis no clubbing SKIN:  No rashes no nodules NEURO:  Cranial nerves II through XII grossly intact, motor grossly intact throughout PSYCH:  Cognitively intact, oriented to person place and time  EKG:  Sinus rhythm, rate 65, axis within normal limits, intervals within normal limits, nonspecific lateral T-wave flattening with previous T-wave inversions resolved. 03/01/2012  ASSESSMENT AND PLAN  Abnormal Stress Test The patient is continuing to have chest discomfort particularly with exertion. He had an abnormal EKG. Despite the fact that his perfusion images were normal he is EKG demonstrated ST segment changes and he had chest pain during exercise. Therefore, the pretest probability of obstructive coronary disease is very high. Cardiac catheterization is indicated. The patient understands that risks included but are not limited to stroke (1 in 1000), death (1 in 1000), kidney failure [usually temporary] (1 in 500), bleeding (1 in 200), allergic reaction [possibly serious] (1 in 200).  The patient understands and agrees to proceed.   Murmur - This will be further evaluated at the time of his catheterization. I suspect aortic sclerosis  CKD - He has had some mild renal insufficiency and I will be checking a creatinine today. If his creatinine is elevated above previous to hold off on catheterization and consider hydration first.  HTN - His blood pressure is well controlled. He will continue the meds as listed.

## 2012-03-11 ENCOUNTER — Other Ambulatory Visit: Payer: Self-pay | Admitting: Internal Medicine

## 2012-03-12 ENCOUNTER — Encounter: Payer: Self-pay | Admitting: Internal Medicine

## 2012-03-12 NOTE — Telephone Encounter (Signed)
It appears patient is taking this medication daily, last filled 01/16/2012 #60 with 1 refill. Please advise if ok to give additional refills and indicate if ok for patient to take BID "Everyday"

## 2012-03-12 NOTE — Telephone Encounter (Signed)
#  60 , R X2

## 2012-03-19 ENCOUNTER — Encounter: Payer: Self-pay | Admitting: Cardiology

## 2012-03-19 ENCOUNTER — Ambulatory Visit (INDEPENDENT_AMBULATORY_CARE_PROVIDER_SITE_OTHER): Payer: Medicare Other | Admitting: Cardiology

## 2012-03-19 VITALS — BP 140/80 | HR 68 | Ht 71.0 in | Wt 198.0 lb

## 2012-03-19 DIAGNOSIS — E785 Hyperlipidemia, unspecified: Secondary | ICD-10-CM

## 2012-03-19 DIAGNOSIS — R9431 Abnormal electrocardiogram [ECG] [EKG]: Secondary | ICD-10-CM

## 2012-03-19 DIAGNOSIS — I739 Peripheral vascular disease, unspecified: Secondary | ICD-10-CM

## 2012-03-19 NOTE — Progress Notes (Signed)
HPI The patient presents for evaluation of an abnormal EKG. He was noted to have T-wave inversions in his anterior leads this year during routine exam. He didn't describe some atypical chest discomfort and I sent him for a stress perfusion study. This demonstrated no evidence of ischemia on images. However, he did have 2 mm horizontal ST changes in the lateral leads. He had chest pain with exertion. I brought him back for a cath which demonstrated nonobstructive disease.  He had his cath done via the radial approach. He has had some soreness in his arm following this.  The patient denies any new symptoms such as chest discomfort, neck or arm discomfort. There has been no new shortness of breath, PND or orthopnea. There have been no reported palpitations, presyncope or syncope.  No Known Allergies  Current Outpatient Prescriptions  Medication Sig Dispense Refill  . aspirin EC 81 MG tablet Take 81 mg by mouth daily.      [name] FOLIC ACID PO Take 1 capsule by mouth 2 (two) times daily.      . Omega-3 Fatty Acids (FISH OIL) 1000 MG CAPS Take 1,000 mg by mouth daily.      [name] omeprazole (PRILOSEC) 20 MG capsule Take 1 capsule (20 mg total) by mouth daily.  90 capsule  2  . polyethylene glycol (MIRALAX / GLYCOLAX) packet Take 17 g by mouth every evening.       . psyllium (METAMUCIL) 58.6 % packet Take 1 packet by mouth daily.      . simvastatin (ZOCOR) 40 MG tablet Take 40 mg by mouth at bedtime.      . traMADol (ULTRAM) 50 MG tablet TAKE 1 TABLET TWICE A DAY AS NEEDED  60 tablet  2   No current facility-administered medications for this visit.    Past Medical History  Diagnosis Date  . Arthritis   . Anxiety   . Hypertension   . Hyperlipidemia   . Chronic kidney disease   . Prostate cancer   . Cataract     NOS  . Impotence of organic origin   . PVD (peripheral vascular disease)   . Metabolic syndrome X   . Dysphagia     PMH of  . Diverticulosis of colon   . Hemorrhoids, internal   .  Hiatal hernia   . Skin cancer   . History of colon polyps     hyperplastic; Dr Jarold Motto    Past Surgical History  Procedure Laterality Date  . Appendectomy  1950  . Cervical fusion    . Colonoscopy w/ polypectomy      Dr Jarold Motto  . Prostatectomy  1995    Dr.[name]; no chemo or radiation  . Tonsillectomy    . Shoulder surgery      right    ROS:  As stated in the HPI otherwise negative for all other systems.  PHYSICAL EXAM BP 140/80  Pulse 68  Ht 5\' 11"  (1.803 m)  Wt 198 lb (89.812 kg)  BMI 27.63 kg/m2 GENERAL:  Well appearingI was in NECK:  No jugular venous distention, waveform within normal limits, carotid upstroke brisk and symmetric, no bruits, no thyromegaly LUNGS:  Clear to auscultation bilaterally CHEST:  Unremarkable HEART:  PMI not displaced or sustained,S1 and S2 within normal limits, no S3, no S4, no clicks, no rubs, slight apical systolic murmur radiating slightly out the aortic outflow tract, no diastolic murmurs ABD:  Flat, positive bowel sounds normal in frequency in pitch, no bruits, no  rebound, no guarding, no midline pulsatile mass, no hepatomegaly, no splenomegaly EXT:  2 plus pulses throughout, no edema, no cyanosis no clubbing, radial cath site with slight swelling but no pulsatile mass, bruising.   ASSESSMENT AND PLAN  Abnormal Stress Test The patient Had mild nonobstructive plaque. No further cardiovascular testing is suggested. He will continue with primary risk reduction.  Murmur - He had no gradient on cath. No further workup is planned  CKD - He has had mild renal insufficiency.  This can be followed by Dr. Alwyn Ren going forward.  HTN - His blood pressure is well controlled. He will continue the meds as listed.

## 2012-03-27 ENCOUNTER — Telehealth: Payer: Self-pay | Admitting: Internal Medicine

## 2012-03-28 MED ORDER — SIMVASTATIN 40 MG PO TABS: 40.0000 mg | ORAL_TABLET | Freq: Every day | ORAL | Status: DC

## 2012-03-28 NOTE — Telephone Encounter (Signed)
RX sent electronically 

## 2012-04-02 ENCOUNTER — Telehealth: Payer: Self-pay | Admitting: Internal Medicine

## 2012-04-02 MED ORDER — OMEPRAZOLE 20 MG PO CPDR: 20.0000 mg | DELAYED_RELEASE_CAPSULE | Freq: Every day | ORAL | Status: DC

## 2012-04-02 NOTE — Telephone Encounter (Signed)
REFILL ON OMEPRAZOLE CAP 20 MG  #90  SIG: TAKE 1 BY MOUTH DAILY.

## 2012-04-02 NOTE — Telephone Encounter (Signed)
RX sent electronically 

## 2012-05-25 ENCOUNTER — Encounter: Payer: Self-pay | Admitting: Pulmonary Disease

## 2012-06-06 ENCOUNTER — Other Ambulatory Visit: Payer: Self-pay | Admitting: Internal Medicine

## 2012-07-09 ENCOUNTER — Other Ambulatory Visit: Payer: Self-pay | Admitting: Internal Medicine

## 2012-07-09 NOTE — Telephone Encounter (Signed)
Spoke to pharmacy, pt still ha refills left on file. Refill request sent in error.

## 2012-08-21 ENCOUNTER — Other Ambulatory Visit: Payer: Self-pay | Admitting: Internal Medicine

## 2012-08-21 NOTE — Telephone Encounter (Signed)
Last UDS 01/18/12- low risk, due NOW. Spoke with patient's spouse, verbalized understanding that patient is due for UDS

## 2012-09-20 ENCOUNTER — Encounter: Payer: Self-pay | Admitting: Internal Medicine

## 2012-09-24 ENCOUNTER — Other Ambulatory Visit: Payer: Self-pay | Admitting: Internal Medicine

## 2012-09-25 NOTE — Telephone Encounter (Signed)
Last visit: 02/02/2012  Last filled: 08/21/2012  UDS: 08/24/2012-low risk Contract on file  Please advise. SW, CMA

## 2012-09-25 NOTE — Telephone Encounter (Signed)
OK X1 

## 2012-10-02 ENCOUNTER — Other Ambulatory Visit: Payer: Self-pay | Admitting: *Deleted

## 2012-10-03 NOTE — Telephone Encounter (Signed)
Med filled.  

## 2012-10-30 ENCOUNTER — Other Ambulatory Visit: Payer: Self-pay | Admitting: Internal Medicine

## 2012-10-30 NOTE — Telephone Encounter (Signed)
OK X1 

## 2012-10-30 NOTE — Telephone Encounter (Signed)
traMADol (ULTRAM) 50 MG tablet Last OV: 02/02/2012 Last refill: 09/24/2012 #60 UDS up to date: Low Risk

## 2012-10-30 NOTE — Telephone Encounter (Signed)
Refill faxed to pharmacy.

## 2012-11-01 ENCOUNTER — Telehealth (INDEPENDENT_AMBULATORY_CARE_PROVIDER_SITE_OTHER): Payer: Medicare Other | Admitting: *Deleted

## 2012-11-01 DIAGNOSIS — Z23 Encounter for immunization: Secondary | ICD-10-CM

## 2012-11-01 NOTE — Telephone Encounter (Signed)
Error

## 2012-11-19 ENCOUNTER — Other Ambulatory Visit: Payer: Self-pay | Admitting: *Deleted

## 2012-11-19 MED ORDER — SIMVASTATIN 40 MG PO TABS: 40.0000 mg | ORAL_TABLET | Freq: Every day | ORAL | Status: DC

## 2012-11-19 MED ORDER — OMEPRAZOLE 20 MG PO CPDR: 20.0000 mg | DELAYED_RELEASE_CAPSULE | Freq: Every day | ORAL | Status: DC

## 2012-11-19 NOTE — Telephone Encounter (Signed)
Simvastatin and Omeprazole refills sent to pharamcy

## 2012-12-03 ENCOUNTER — Other Ambulatory Visit: Payer: Self-pay | Admitting: *Deleted

## 2012-12-03 DIAGNOSIS — E785 Hyperlipidemia, unspecified: Secondary | ICD-10-CM

## 2012-12-03 DIAGNOSIS — K219 Gastro-esophageal reflux disease without esophagitis: Secondary | ICD-10-CM

## 2012-12-03 MED ORDER — OMEPRAZOLE 20 MG PO CPDR: 20.0000 mg | DELAYED_RELEASE_CAPSULE | Freq: Every day | ORAL | Status: DC

## 2012-12-03 MED ORDER — SIMVASTATIN 40 MG PO TABS: 40.0000 mg | ORAL_TABLET | Freq: Every day | ORAL | Status: DC

## 2012-12-03 NOTE — Telephone Encounter (Signed)
Rf for prilosec and zocor sent to [facility]

## 2012-12-09 ENCOUNTER — Other Ambulatory Visit: Payer: Self-pay | Admitting: Internal Medicine

## 2012-12-10 NOTE — Telephone Encounter (Signed)
traMADol (ULTRAM) 50 MG tablet Last refill: 10/30/2012 #60, 0 refills Last OV: 02/02/2012 UDS up-to-date, low risk

## 2012-12-10 NOTE — Telephone Encounter (Signed)
OK x 1 

## 2013-01-21 ENCOUNTER — Other Ambulatory Visit: Payer: Self-pay | Admitting: Internal Medicine

## 2013-01-21 NOTE — Telephone Encounter (Signed)
traMADol (ULTRAM) 50 MG tablet Last refill: 12/09/12 #30, 0 refills Last OV: 02/02/12 UDS up-to-date; low risk

## 2013-01-21 NOTE — Telephone Encounter (Signed)
OK X1 

## 2013-02-01 ENCOUNTER — Telehealth: Payer: Self-pay

## 2013-02-01 NOTE — Telephone Encounter (Signed)
Medication List and allergies:  Updated and Reviewed  90 day supply/mail order: [facility] Local prescriptions:  [facility] [address]   Immunization due:  UTD  A/P: No changes to personal, family or [place] Flu- 11/01/12 Tdap- 02/02/12 PNA- 11/09/04 Shingles- 11/02/09 CCS- 12/24/2003-polyps, diverticulosis, and hemorrhoids-recommended repeat every 3 years. PSA-Saw [facility].  Last appt. 08/11/11.     To discuss with provider: Not at this time.

## 2013-02-04 ENCOUNTER — Encounter: Payer: Self-pay | Admitting: Internal Medicine

## 2013-02-04 ENCOUNTER — Ambulatory Visit (INDEPENDENT_AMBULATORY_CARE_PROVIDER_SITE_OTHER): Payer: Medicare Other | Admitting: Internal Medicine

## 2013-02-04 VITALS — BP 121/67 | HR 58 | Temp 97.9°F | Ht 69.5 in | Wt 201.8 lb

## 2013-02-04 DIAGNOSIS — Z0000 Encounter for general adult medical examination without abnormal findings: Secondary | ICD-10-CM

## 2013-02-04 DIAGNOSIS — R7989 Other specified abnormal findings of blood chemistry: Secondary | ICD-10-CM

## 2013-02-04 DIAGNOSIS — R799 Abnormal finding of blood chemistry, unspecified: Secondary | ICD-10-CM

## 2013-02-04 DIAGNOSIS — D126 Benign neoplasm of colon, unspecified: Secondary | ICD-10-CM

## 2013-02-04 DIAGNOSIS — E785 Hyperlipidemia, unspecified: Secondary | ICD-10-CM

## 2013-02-04 LAB — CBC WITH DIFFERENTIAL/PLATELET
BASOS PCT: 0.5 % (ref 0.0–3.0)
Basophils Absolute: 0 10*3/uL (ref 0.0–0.1)
EOS PCT: 1.7 % (ref 0.0–5.0)
Eosinophils Absolute: 0.1 10*3/uL (ref 0.0–0.7)
HEMATOCRIT: 43.7 % (ref 39.0–52.0)
HEMOGLOBIN: 14.3 g/dL (ref 13.0–17.0)
LYMPHS ABS: 1.8 10*3/uL (ref 0.7–4.0)
Lymphocytes Relative: 29.7 % (ref 12.0–46.0)
MCHC: 32.7 g/dL (ref 30.0–36.0)
MCV: 90.7 fl (ref 78.0–100.0)
MONO ABS: 0.4 10*3/uL (ref 0.1–1.0)
MONOS PCT: 6.8 % (ref 3.0–12.0)
NEUTROS ABS: 3.8 10*3/uL (ref 1.4–7.7)
Neutrophils Relative %: 61.3 % (ref 43.0–77.0)
PLATELETS: 170 10*3/uL (ref 150.0–400.0)
RBC: 4.81 Mil/uL (ref 4.22–5.81)
RDW: 13.5 % (ref 11.5–14.6)
WBC: 6.2 10*3/uL (ref 4.5–10.5)

## 2013-02-04 LAB — BASIC METABOLIC PANEL
BUN: 17 mg/dL (ref 6–23)
CHLORIDE: 107 meq/L (ref 96–112)
CO2: 26 mEq/L (ref 19–32)
Calcium: 9.6 mg/dL (ref 8.4–10.5)
Creatinine, Ser: 1.5 mg/dL (ref 0.4–1.5)
GFR: 46.66 mL/min — ABNORMAL LOW (ref >60.00)
Glucose, Bld: 92 mg/dL (ref 70–99)
POTASSIUM: 5 meq/L (ref 3.5–5.1)
SODIUM: 142 meq/L (ref 135–145)

## 2013-02-04 LAB — TSH: TSH: 1.79 u[IU]/mL (ref 0.35–5.50)

## 2013-02-04 LAB — LIPID PANEL
CHOLESTEROL: 161 mg/dL (ref 0–200)
HDL: 60.6 mg/dL (ref >39.00)
LDL Cholesterol: 80 mg/dL (ref 0–99)
TRIGLYCERIDES: 103 mg/dL (ref 0.0–149.0)
Total CHOL/HDL Ratio: 3
VLDL: 20.6 mg/dL (ref 0.0–40.0)

## 2013-02-04 LAB — HEPATIC FUNCTION PANEL
ALBUMIN: 4.4 g/dL (ref 3.5–5.2)
ALT: 17 U/L (ref 0–53)
AST: 20 U/L (ref 0–37)
Alkaline Phosphatase: 38 U/L — ABNORMAL LOW (ref 39–117)
Bilirubin, Direct: 0 mg/dL (ref 0.0–0.3)
TOTAL PROTEIN: 7.4 g/dL (ref 6.0–8.3)
Total Bilirubin: 0.6 mg/dL (ref 0.3–1.2)

## 2013-02-04 NOTE — Patient Instructions (Signed)
Your next office appointment will be determined based upon review of your pending labs . Those instructions will be transmitted to you through My Chart  or by mail if you're not using this system.   Share results with all non [facility] medical staff seen

## 2013-02-04 NOTE — Progress Notes (Signed)
Subjective:    Patient ID: Matthew [name], male    DOB: 1930-06-29, 80 y.o.   MRN: [id number]  HPI Medicare Wellness Visit: Psychosocial and medical history were reviewed as required by Medicare (history related to abuse, antisocial behavior , firearm risk). Social history: Caffeine:minimal  , Alcohol: no , Tobacco use:no Exercise:to start Personal safety/fall risk:no Limitations of activities of daily living:no Seatbelt/ smoke alarm use:yes Healthcare Power of Attorney/Living Will status: in place Ophthalmologic exam status:UTD Hearing evaluation status:not current Orientation: not oriented Memory and recall: 2 of 3 Math testing: incorrect Depression/anxiety assessment: no Foreign travel history:[place] 2009 Immunization status for influenza/pneumonia/ shingles /tetanus:UTD Transfusion history:no Preventive health care maintenance status: Colonoscopy as per protocol/standard care:aged out Dental care:12 mos Chart reviewed and updated. Active issues reviewed and addressed as documented below.     Review of Systems A low carb,heart healthy diet is followed; no regular exercise . Family history is negative for premature coronary disease. Advanced cholesterol testing reveals  LDL goal is less than 130 ; ideally < 100 .   Low dose ASA taken Specifically denied are  chest pain, palpitations, dyspnea, or claudication.  Significant abdominal symptoms or myalgias not present.     Objective:   Physical Exam Gen.: Healthy and well-nourished in appearance. Alert, appropriate and cooperative throughout exam. Appears younger than stated age  Head: Normocephalic without obvious abnormalities; pattern alopecia  Eyes: No corneal or conjunctival inflammation noted. Pupils equal round reactive to light and accommodation. Extraocular motion intact.  Ears: External  ear exam reveals no significant lesions or deformities. Canals clear .TMs normal. Hearing is grossly decreased bilaterally. Nose:  External nasal exam reveals no deformity or inflammation. Nasal mucosa are pink and moist. No lesions or exudates noted.   Mouth: Oral mucosa and oropharynx reveal no lesions or exudates. Teeth in good repair. Neck: No deformities, masses, or tenderness noted. Range of motion decreased. Thyroid normal. Lungs: Normal respiratory effort; chest expands symmetrically. Lungs are clear to auscultation without rales, wheezes, or increased work of breathing. Heart: Normal rate and rhythm. Normal S1 and S2. No gallop, click, or rub. Grade 1/6 systolic murmur @ base. Abdomen: Bowel sounds normal; abdomen soft and nontender. No masses, organomegaly or hernias noted. Genitalia:  as per Urology                                  Musculoskeletal/extremities: No deformity or scoliosis noted of  the thoracic or lumbar spine.  No clubbing, cyanosis, edema, or significant extremity  deformity noted. Range of motion normal .Tone & strength normal. Hand joints reveal mild  PIP &  DIP changes. Fingernail  health good. Able to lie down & sit up w/o help. Negative SLR bilaterally Vascular: Carotid, radial artery, dorsalis pedis and  posterior tibial pulses are full and equal. No bruits present. Neurologic: Alert but not oriented . Deep tendon reflexes symmetrical and normal.         Skin: Intact without suspicious lesions or rashes. Lymph: No cervical, axillary lymphadenopathy present. Psych: Mood and affect are normal. Normally interactive  Assessment & Plan:  #1 Medicare Wellness Exam; criteria met ; data entered #2 Problem List/Diagnoses reviewed Plan:  Assessments made/ Orders entered  

## 2013-02-04 NOTE — Progress Notes (Signed)
Pre visit review using our clinic review tool, if applicable. No additional management support is needed unless otherwise documented below in the visit note. 

## 2013-02-15 ENCOUNTER — Ambulatory Visit (INDEPENDENT_AMBULATORY_CARE_PROVIDER_SITE_OTHER): Payer: Medicare Other | Admitting: Internal Medicine

## 2013-02-15 ENCOUNTER — Encounter: Payer: Self-pay | Admitting: Internal Medicine

## 2013-02-15 VITALS — BP 140/80 | HR 67 | Temp 97.3°F | Wt 203.0 lb

## 2013-02-15 DIAGNOSIS — H9203 Otalgia, bilateral: Secondary | ICD-10-CM

## 2013-02-15 DIAGNOSIS — H6980 Other specified disorders of Eustachian tube, unspecified ear: Secondary | ICD-10-CM

## 2013-02-15 DIAGNOSIS — H9209 Otalgia, unspecified ear: Secondary | ICD-10-CM

## 2013-02-15 NOTE — Progress Notes (Signed)
   Subjective:    Patient ID: Matthew [name], male    DOB: 09-Nov-1930, 80 y.o.   MRN: [id number]  HPI  He describes ear symptoms since 1/26. Mainly it is described as itching; it started on the right but now involvess both years  He also describes some discomfort in his throat as well as some itching  He's had some thick ,slightly yellow nasal discharge      Review of Systems There is no associated frontal sinus pain, facial pain, dental pain, fever, chills, or sweats.  No cough or sputum production.     Objective:   Physical Exam General appearance:good health ;well nourished; no acute distress or increased work of breathing is present.  No  lymphadenopathy about the head, neck, or axilla noted.   Eyes: No conjunctival inflammation or lid edema is present.  Ears:  External ear exam shows no significant lesions or deformities.  Otoscopic examination reveals wax bilaterally. R TM dull w/o without bulging, retraction, inflammation or discharge.  Nose:  External nasal examination shows no deformity or inflammation. Nasal mucosa are pink and moist without lesions or exudates. No septal dislocation or deviation.No obstruction to airflow.   Oral exam: Dental hygiene is good; lips and gums are healthy appearing.There is no oropharyngeal erythema or exudate noted.   Neck:  No deformities,  masses, or tenderness noted.   Supple but decreased range of motion without pain.   Heart:  Normal rate and regular rhythm. S1 and S2 normal without gallop, click, rub or other extra sounds. Grade 1/2 over 6 systolic murmur  Lungs:Chest clear to auscultation; no wheezes, rhonchi,rales ,or rubs present.No increased work of breathing.    Extremities:  No cyanosis, edema, or clubbing  noted    Skin: Warm & dry   Neuro: he has difficulty remembering symptoms; wife corrects his history         Assessment & Plan:  #1 otalgia  #2 wax w/o impaction See orders

## 2013-02-15 NOTE — Progress Notes (Signed)
Pre visit review using our clinic review tool, if applicable. No additional management support is needed unless otherwise documented below in the visit note. 

## 2013-02-15 NOTE — Patient Instructions (Signed)
Please do not use Q-tips as we discussed. Should wax build up occur, please put 2-3 drops of mineral oil in the affected  ear at night to soften the wax .Cover the canal with a  cotton ball to prevent the oil from staining bed linens. In the morning fill the ear canal with hydrogen peroxide & lie in the opposite lateral decubitus position(on the side opposite the affected ear)  for 10-15 minutes. After allowing this period of time for the peroxide to dissolve the wax ;shower and use the thinnest washrag available to wick out the wax. If both ears are involved ; alternate this treatment from ear to ear each night until no wax is found on the washrag. Drink thin  fluids liberally through the day and chew sugarless gum . Do the Valsalva maneuver several times a day to "pop" ears open. Flonase or Nasacort AQ 1 spray in each nostril twice a day as needed. Use the "crossover" technique as discussed.

## 2013-03-04 ENCOUNTER — Other Ambulatory Visit: Payer: Self-pay | Admitting: Internal Medicine

## 2013-03-05 ENCOUNTER — Other Ambulatory Visit: Payer: Self-pay | Admitting: *Deleted

## 2013-03-05 DIAGNOSIS — K219 Gastro-esophageal reflux disease without esophagitis: Secondary | ICD-10-CM

## 2013-03-05 DIAGNOSIS — E785 Hyperlipidemia, unspecified: Secondary | ICD-10-CM

## 2013-03-05 MED ORDER — OMEPRAZOLE 20 MG PO CPDR: 20.0000 mg | DELAYED_RELEASE_CAPSULE | Freq: Every day | ORAL | Status: DC

## 2013-03-05 MED ORDER — SIMVASTATIN 40 MG PO TABS: 40.0000 mg | ORAL_TABLET | Freq: Every day | ORAL | Status: DC

## 2013-03-05 NOTE — Telephone Encounter (Signed)
OK X1 

## 2013-03-05 NOTE — Telephone Encounter (Signed)
Rx's sent to the pharmacy by e-script.//AB/CMA 

## 2013-03-05 NOTE — Telephone Encounter (Signed)
Requesting Tramadol 50mg  Take 1 tablet by mouth twice a day as needed. Last refill:01-21-13:#60 Last OV:02-04-13 UDS:08-24-12-low risk Please advise.//AB/CMA

## 2013-03-06 NOTE — Telephone Encounter (Signed)
Rx printed and faxed to the pharmacy.//AB/CMA 

## 2013-04-05 ENCOUNTER — Other Ambulatory Visit: Payer: Self-pay

## 2013-04-05 MED ORDER — TRAMADOL HCL 50 MG PO TABS: ORAL_TABLET | ORAL | Status: DC

## 2013-04-05 NOTE — Telephone Encounter (Signed)
rx for tramadol 50 mg printed, signed by dr. Linna Darner and faxed to [facility]

## 2013-05-06 ENCOUNTER — Other Ambulatory Visit: Payer: Self-pay

## 2013-05-06 MED ORDER — TRAMADOL HCL 50 MG PO TABS: ORAL_TABLET | ORAL | Status: DC

## 2013-05-06 NOTE — Telephone Encounter (Signed)
OK X1 

## 2013-05-06 NOTE — Telephone Encounter (Signed)
Last ov with you on 02/15/2013 Med last filled on 04/05/2013 #60

## 2013-05-24 ENCOUNTER — Ambulatory Visit (INDEPENDENT_AMBULATORY_CARE_PROVIDER_SITE_OTHER): Payer: Medicare Other | Admitting: Internal Medicine

## 2013-05-24 ENCOUNTER — Encounter: Payer: Self-pay | Admitting: Internal Medicine

## 2013-05-24 VITALS — BP 132/80 | HR 62 | Temp 97.3°F | Resp 15 | Wt 200.1 lb

## 2013-05-24 DIAGNOSIS — N390 Urinary tract infection, site not specified: Secondary | ICD-10-CM

## 2013-05-24 DIAGNOSIS — M545 Low back pain, unspecified: Secondary | ICD-10-CM

## 2013-05-24 DIAGNOSIS — R3129 Other microscopic hematuria: Secondary | ICD-10-CM

## 2013-05-24 LAB — POCT URINALYSIS DIPSTICK
Bilirubin, UA: NEGATIVE
GLUCOSE UA: NEGATIVE
KETONES UA: NEGATIVE
Nitrite, UA: POSITIVE
PROTEIN UA: NEGATIVE
UROBILINOGEN UA: 0.2
pH, UA: 5

## 2013-05-24 MED ORDER — SULFAMETHOXAZOLE-TMP DS 800-160 MG PO TABS: 1.0000 | ORAL_TABLET | Freq: Two times a day (BID) | ORAL | Status: DC

## 2013-05-24 NOTE — Progress Notes (Signed)
   Subjective:    Patient ID: Matthew [name], male    DOB: 1930/11/02, 80 y.o.   MRN: [id number]  HPI   Symptoms again 05/22/12 as burning with urination , urgency and frequency.  The symptoms are actually improving without specific treatment except Azo his wife gave him.  PMH of prostatectomy for cancer . PMH elevated creatinine.   Review of Systems  He also experienced low back pain.  Chills without fever or sweats have been present.  He denies associated pyuria or hematuria.  He has not taken antibiotics in past 30 days.       Objective:   Physical Exam General appearance is one of good health and nourishment w/o distress.  Eyes: No conjunctival inflammation or scleral icterus is present. Heart:  Normal rate and regular rhythm. S1 and S2 normal without gallop, murmur, click, rub or other extra sounds     Lungs:Chest clear to auscultation; no wheezes, rhonchi,rales ,or rubs present.No increased work of breathing.   Abdomen: bowel sounds normal, soft and non-tender without masses, organomegaly or hernias noted.  No guarding or rebound . No tenderness over the flanks to percussion  Musculoskeletal: Classic low back crawl sitting up from the exam table. Some tenderness over the lower lumbosacral spine.. Negative straight leg raising bilaterally. Gait normal  Skin:Warm & dry.  Intact without suspicious lesions or rashes ; no jaundice or tenting  Lymphatic: No lymphadenopathy is noted about the head, neck, axilla.                Assessment & Plan:  #1 dysuria, urgency, frequency; probable urinary tract infection #2 unrelated chronic LBP  See orders and after visit summary.

## 2013-05-24 NOTE — Patient Instructions (Signed)
Drink as much nondairy fluids as possible. Avoid spicy foods or alcohol as  these may aggravate the  Bladder &  prostate. Do not take decongestants. Avoid narcotics if possible.

## 2013-05-24 NOTE — Progress Notes (Signed)
Pre visit review using our clinic review tool, if applicable. No additional management support is needed unless otherwise documented below in the visit note. 

## 2013-05-29 ENCOUNTER — Telehealth: Payer: Self-pay | Admitting: *Deleted

## 2013-05-29 NOTE — Telephone Encounter (Signed)
Matthew [name], pts wife, called requesting Tramadol refill.  Please advise

## 2013-05-29 NOTE — Telephone Encounter (Signed)
OK X1, R X1 

## 2013-05-30 MED ORDER — TRAMADOL HCL 50 MG PO TABS: ORAL_TABLET | ORAL | Status: DC

## 2013-08-06 ENCOUNTER — Other Ambulatory Visit: Payer: Self-pay

## 2013-08-06 MED ORDER — TRAMADOL HCL 50 MG PO TABS: ORAL_TABLET | ORAL | Status: DC

## 2013-08-06 NOTE — Telephone Encounter (Signed)
OK X1 Prn only 

## 2013-09-09 ENCOUNTER — Other Ambulatory Visit: Payer: Self-pay

## 2013-09-09 MED ORDER — TRAMADOL HCL 50 MG PO TABS: ORAL_TABLET | ORAL | Status: DC

## 2013-09-09 NOTE — Telephone Encounter (Signed)
OK X1 

## 2013-09-09 NOTE — Telephone Encounter (Signed)
Last filled 08/06/13

## 2013-10-10 ENCOUNTER — Other Ambulatory Visit: Payer: Self-pay

## 2013-10-10 MED ORDER — TRAMADOL HCL 50 MG PO TABS: ORAL_TABLET | ORAL | Status: DC

## 2013-10-10 NOTE — Telephone Encounter (Signed)
[facility]

## 2013-10-10 NOTE — Telephone Encounter (Signed)
OK X1 

## 2013-11-09 ENCOUNTER — Ambulatory Visit (INDEPENDENT_AMBULATORY_CARE_PROVIDER_SITE_OTHER): Payer: Medicare Other

## 2013-11-09 DIAGNOSIS — Z23 Encounter for immunization: Secondary | ICD-10-CM

## 2013-11-11 ENCOUNTER — Other Ambulatory Visit: Payer: Self-pay

## 2013-11-11 MED ORDER — TRAMADOL HCL 50 MG PO TABS: ORAL_TABLET | ORAL | Status: DC

## 2013-11-11 NOTE — Telephone Encounter (Signed)
Med last filled 10/10/13 Last office visit 05/24/13

## 2013-11-11 NOTE — Telephone Encounter (Signed)
Tramadol has been faxed to [facility]

## 2013-11-11 NOTE — Telephone Encounter (Signed)
OK X1 Take prn only; not a maintenance medication

## 2013-11-26 ENCOUNTER — Other Ambulatory Visit: Payer: Self-pay | Admitting: Dermatology

## 2013-12-09 ENCOUNTER — Other Ambulatory Visit: Payer: Self-pay | Admitting: Dermatology

## 2013-12-11 ENCOUNTER — Other Ambulatory Visit: Payer: Self-pay

## 2013-12-11 MED ORDER — TRAMADOL HCL 50 MG PO TABS: ORAL_TABLET | ORAL | Status: DC

## 2013-12-11 NOTE — Telephone Encounter (Signed)
Tramadol has been phoned to pharmacy voice line

## 2013-12-11 NOTE — Telephone Encounter (Signed)
Medication refilled

## 2013-12-11 NOTE — Telephone Encounter (Signed)
MD out of the office this week  Last office visit 05/24/2013 Med last phoned to pharmacy 11.2.15 Okay for refill ? Please send back to me, thanks

## 2013-12-19 ENCOUNTER — Encounter (HOSPITAL_COMMUNITY): Payer: Self-pay | Admitting: Cardiology

## 2014-01-04 ENCOUNTER — Other Ambulatory Visit: Payer: Self-pay | Admitting: Internal Medicine

## 2014-01-08 ENCOUNTER — Other Ambulatory Visit: Payer: Self-pay | Admitting: Family

## 2014-01-14 ENCOUNTER — Other Ambulatory Visit: Payer: Self-pay

## 2014-01-14 MED ORDER — TRAMADOL HCL 50 MG PO TABS: ORAL_TABLET | ORAL | Status: DC

## 2014-01-14 NOTE — Telephone Encounter (Signed)
OK X1 

## 2014-01-14 NOTE — Telephone Encounter (Signed)
[facility] [address]

## 2014-02-11 ENCOUNTER — Other Ambulatory Visit: Payer: Self-pay

## 2014-02-11 MED ORDER — TRAMADOL HCL 50 MG PO TABS
ORAL_TABLET | ORAL | Status: DC
Start: 2014-02-11 — End: 2014-03-12

## 2014-02-11 NOTE — Telephone Encounter (Signed)
OK X1 

## 2014-02-11 NOTE — Telephone Encounter (Signed)
Patient last seen 05/24/13 Med last phoned to pharmacy on 01/14/14

## 2014-02-11 NOTE — Telephone Encounter (Signed)
[facility] [address]

## 2014-03-12 ENCOUNTER — Other Ambulatory Visit: Payer: Self-pay

## 2014-03-12 MED ORDER — TRAMADOL HCL 50 MG PO TABS: ORAL_TABLET | ORAL | Status: DC

## 2014-03-12 NOTE — Telephone Encounter (Signed)
[facility]

## 2014-03-12 NOTE — Telephone Encounter (Signed)
OK X1 

## 2014-04-03 ENCOUNTER — Other Ambulatory Visit: Payer: Self-pay | Admitting: Dermatology

## 2014-04-16 ENCOUNTER — Other Ambulatory Visit: Payer: Self-pay

## 2014-04-16 MED ORDER — TRAMADOL HCL 50 MG PO TABS: ORAL_TABLET | ORAL | Status: DC

## 2014-04-16 NOTE — Telephone Encounter (Signed)
Tramadol has been called to pharmacy  

## 2014-04-16 NOTE — Telephone Encounter (Signed)
OK x 1 

## 2014-05-29 ENCOUNTER — Encounter: Payer: Self-pay | Admitting: Internal Medicine

## 2014-05-29 ENCOUNTER — Ambulatory Visit (INDEPENDENT_AMBULATORY_CARE_PROVIDER_SITE_OTHER): Payer: Medicare Other | Admitting: Internal Medicine

## 2014-05-29 ENCOUNTER — Other Ambulatory Visit: Payer: Self-pay

## 2014-05-29 VITALS — BP 108/60 | HR 72 | Temp 97.6°F | Resp 14 | Wt 200.8 lb

## 2014-05-29 DIAGNOSIS — R7989 Other specified abnormal findings of blood chemistry: Secondary | ICD-10-CM

## 2014-05-29 DIAGNOSIS — D126 Benign neoplasm of colon, unspecified: Secondary | ICD-10-CM

## 2014-05-29 DIAGNOSIS — R413 Other amnesia: Secondary | ICD-10-CM

## 2014-05-29 DIAGNOSIS — R748 Abnormal levels of other serum enzymes: Secondary | ICD-10-CM

## 2014-05-29 DIAGNOSIS — K5730 Diverticulosis of large intestine without perforation or abscess without bleeding: Secondary | ICD-10-CM

## 2014-05-29 DIAGNOSIS — E785 Hyperlipidemia, unspecified: Secondary | ICD-10-CM | POA: Diagnosis not present

## 2014-05-29 MED ORDER — TRAMADOL HCL 50 MG PO TABS: ORAL_TABLET | ORAL | Status: DC

## 2014-05-29 NOTE — Progress Notes (Signed)
   Subjective:    Patient ID: Matthew [name], male    DOB: 12-01-1930, 81 y.o.   MRN: [id number]  HPI The patient is here to assess status of active health conditions.  PMH, FH, & Social History reviewed & updated.  He has been compliant with his medicines without adverse effects. Diet is low-carb, decreased fried foods, decreased red meat. He walks around his yard and does yardwork but has no regular exercise program. He denies any associated cardiopulmonary symptoms.  He has been told that he is aged out of colonoscopies.Hyperplastic polyps and tics in 12/2003. He has no active GI symptoms.    Review of Systems Chest pain, palpitations, tachycardia, exertional dyspnea, paroxysmal nocturnal dyspnea, claudication or edema are absent. Unexplained weight loss, abdominal pain, significant dyspepsia, dysphagia, melena, rectal bleeding, or persistently small caliber stools are denied. Dysuria, pyuria, hematuria, frequency, nocturia or polyuria are denied. Progressive memory loss as per his wife.     Objective:   Physical Exam  Pertinent or positive findings include: He appears younger than his stated age.[name]  He has pattern alopecia.  There are solar keratoses over the scalp.  Wax present in otic canals,right greater than the left. He has decreased hearing bilaterally, greater on the right than the left.  There is dullness to percussion right upper quadrant without organomegaly.  He is wearing Pampers.  He does have crepitus of the knees to slight degree.  He is disoriented to date, name of the President or Iantha Fallen, and his anniversary date. When asked the wedding date ,he gave his birthday.  General appearance :adequately nourished; in no distress. Eyes: No conjunctival inflammation or scleral icterus is present. Oral exam:  Lips and gums are healthy appearing.There is no oropharyngeal erythema or exudate noted. Dental hygiene is good. Heart:  Normal rate and regular rhythm. S1 and S2  normal without gallop, murmur, click, rub or other extra sounds   Lungs:Chest clear to auscultation; no wheezes, rhonchi,rales ,or rubs present.No increased work of breathing.  Abdomen: bowel sounds normal, soft and non-tender without masses, organomegaly or hernias noted.  No guarding or rebound. GU deferred to Urology. Vascular : all pulses equal ; no bruits present. Skin:Warm & dry.  Intact without suspicious lesions or rashes ; no tenting or jaundice  Lymphatic: No lymphadenopathy is noted about the head, neck, axilla Neuro: Strength, tone & DTRs normal.       Assessment & Plan:  See Current Assessment & Plan in Problem List under specific Diagnosis

## 2014-05-29 NOTE — Patient Instructions (Signed)
Please make an appointment with Vicente Serene to perform the Medicare Wellness Exam to update your chart in reference to status of needed immunizations, imaging, and procedures as part of the preventive health care program.   Please do not use Q-tips as we discussed. Should wax build up occur, please put 2-3 drops of mineral oil in the affected  ear at night to soften the wax .Cover the canal with a  cotton ball to prevent the oil from staining bed linens. In the morning fill the ear canal with hydrogen peroxide & lie in the opposite lateral decubitus position(on the side opposite the affected ear)  for 10-15 minutes. After allowing this period of time for the peroxide to dissolve the wax ;shower and use the thinnest washrag available to wick out the wax. If both ears are involved ; alternate this treatment from ear to ear each night until no wax is found on the washrag.  Orders for fasting labs will be entered in the chart; please come to the lab any morning after 7:30 when you have not eaten for at least 8 hours

## 2014-05-29 NOTE — Progress Notes (Signed)
Pre visit review using our clinic review tool, if applicable. No additional management support is needed unless otherwise documented below in the visit note. 

## 2014-05-30 ENCOUNTER — Encounter: Payer: Self-pay | Admitting: Internal Medicine

## 2014-05-30 NOTE — Assessment & Plan Note (Signed)
Neurology reassessment

## 2014-05-30 NOTE — Assessment & Plan Note (Signed)
CBC

## 2014-05-30 NOTE — Assessment & Plan Note (Signed)
Lipids, LFTs, TSH ,CK 

## 2014-05-30 NOTE — Assessment & Plan Note (Signed)
BMET 

## 2014-06-19 ENCOUNTER — Telehealth: Payer: Self-pay

## 2014-06-19 NOTE — Telephone Encounter (Signed)
Call to reschedule apt for earlier time and spouse stated they would have to cancel, as they lost their son-in-law unexpectedly Apt canceled

## 2014-06-26 ENCOUNTER — Ambulatory Visit: Payer: Medicare Other

## 2014-06-27 ENCOUNTER — Other Ambulatory Visit (INDEPENDENT_AMBULATORY_CARE_PROVIDER_SITE_OTHER): Payer: Medicare Other

## 2014-06-27 DIAGNOSIS — D126 Benign neoplasm of colon, unspecified: Secondary | ICD-10-CM | POA: Diagnosis not present

## 2014-06-27 DIAGNOSIS — R413 Other amnesia: Secondary | ICD-10-CM | POA: Diagnosis not present

## 2014-06-27 DIAGNOSIS — R748 Abnormal levels of other serum enzymes: Secondary | ICD-10-CM

## 2014-06-27 DIAGNOSIS — E785 Hyperlipidemia, unspecified: Secondary | ICD-10-CM

## 2014-06-27 DIAGNOSIS — R7989 Other specified abnormal findings of blood chemistry: Secondary | ICD-10-CM

## 2014-06-27 LAB — CBC WITH DIFFERENTIAL/PLATELET
BASOS PCT: 0.4 % (ref 0.0–3.0)
Basophils Absolute: 0 10*3/uL (ref 0.0–0.1)
EOS ABS: 0.1 10*3/uL (ref 0.0–0.7)
Eosinophils Relative: 1.7 % (ref 0.0–5.0)
HCT: 44.6 % (ref 39.0–52.0)
Hemoglobin: 14.6 g/dL (ref 13.0–17.0)
LYMPHS PCT: 35.5 % (ref 12.0–46.0)
Lymphs Abs: 2 10*3/uL (ref 0.7–4.0)
MCHC: 32.8 g/dL (ref 30.0–36.0)
MCV: 89.5 fl (ref 78.0–100.0)
MONO ABS: 0.4 10*3/uL (ref 0.1–1.0)
Monocytes Relative: 7.5 % (ref 3.0–12.0)
NEUTROS PCT: 54.9 % (ref 43.0–77.0)
Neutro Abs: 3.1 10*3/uL (ref 1.4–7.7)
Platelets: 168 10*3/uL (ref 150.0–400.0)
RBC: 4.99 Mil/uL (ref 4.22–5.81)
RDW: 13.5 % (ref 11.5–15.5)
WBC: 5.7 10*3/uL (ref 4.0–10.5)

## 2014-06-27 LAB — LIPID PANEL
CHOLESTEROL: 161 mg/dL (ref 0–200)
HDL: 51.8 mg/dL (ref >39.00)
LDL CALC: 89 mg/dL (ref 0–99)
NonHDL: 109.2
Total CHOL/HDL Ratio: 3
Triglycerides: 100 mg/dL (ref 0.0–149.0)
VLDL: 20 mg/dL (ref 0.0–40.0)

## 2014-06-27 LAB — HEPATIC FUNCTION PANEL
ALT: 11 U/L (ref 0–53)
AST: 14 U/L (ref 0–37)
Albumin: 4.2 g/dL (ref 3.5–5.2)
Alkaline Phosphatase: 39 U/L (ref 39–117)
BILIRUBIN DIRECT: 0.2 mg/dL (ref 0.0–0.3)
BILIRUBIN TOTAL: 0.7 mg/dL (ref 0.2–1.2)
Total Protein: 6.6 g/dL (ref 6.0–8.3)

## 2014-06-27 LAB — TSH: TSH: 1.86 u[IU]/mL (ref 0.35–4.50)

## 2014-06-27 LAB — BASIC METABOLIC PANEL
BUN: 19 mg/dL (ref 6–23)
CALCIUM: 9.1 mg/dL (ref 8.4–10.5)
CO2: 30 meq/L (ref 19–32)
Chloride: 104 mEq/L (ref 96–112)
Creatinine, Ser: 1.44 mg/dL (ref 0.40–1.50)
GFR: 49.87 mL/min — ABNORMAL LOW (ref >60.00)
GLUCOSE: 91 mg/dL (ref 70–99)
Potassium: 4.5 mEq/L (ref 3.5–5.1)
Sodium: 139 mEq/L (ref 135–145)

## 2014-06-27 LAB — RPR

## 2014-06-27 LAB — CK: CK TOTAL: 87 U/L (ref 7–232)

## 2014-06-27 LAB — VITAMIN B12: VITAMIN B 12: 315 pg/mL (ref 211–911)

## 2014-07-03 ENCOUNTER — Other Ambulatory Visit: Payer: Self-pay | Admitting: Emergency Medicine

## 2014-07-03 ENCOUNTER — Telehealth: Payer: Self-pay | Admitting: Emergency Medicine

## 2014-07-03 ENCOUNTER — Encounter: Payer: Self-pay | Admitting: Diagnostic Neuroimaging

## 2014-07-03 ENCOUNTER — Ambulatory Visit (INDEPENDENT_AMBULATORY_CARE_PROVIDER_SITE_OTHER): Payer: Medicare Other | Admitting: Diagnostic Neuroimaging

## 2014-07-03 VITALS — BP 121/73 | HR 62 | Wt 203.2 lb

## 2014-07-03 DIAGNOSIS — F0390 Unspecified dementia without behavioral disturbance: Secondary | ICD-10-CM | POA: Diagnosis not present

## 2014-07-03 DIAGNOSIS — F03B0 Unspecified dementia, moderate, without behavioral disturbance, psychotic disturbance, mood disturbance, and anxiety: Secondary | ICD-10-CM

## 2014-07-03 MED ORDER — DONEPEZIL HCL 5 MG PO TABS: 5.0000 mg | ORAL_TABLET | Freq: Every day | ORAL | Status: DC

## 2014-07-03 MED ORDER — TRAMADOL HCL 50 MG PO TABS: ORAL_TABLET | ORAL | Status: DC

## 2014-07-03 NOTE — Patient Instructions (Signed)
See website LDLive.be  Caution with driving, finances, home safety (oven, stove, power tools).  Consider donepezil and memantine.  Eat more nuts, berries, plants, vegetables. Eat less processed food, fried food, red meat, saltyfood and sugary foods. Focus on mental, social and physical stimulation. Try to get at least 7 hours sleep per day.

## 2014-07-03 NOTE — Telephone Encounter (Signed)
RX refill request from pharm for Tramadol. Please advise. Last OV 05/29/14

## 2014-07-03 NOTE — Telephone Encounter (Signed)
RX for Tramadol faxed to pharm.  

## 2014-07-03 NOTE — Progress Notes (Signed)
[facility]  PATIENT: Matthew [name] DOB: [birth year]  REFERRING CLINICIAN: Hopper  HISTORY FROM: patient, wife, daughter, sone REASON FOR VISIT: follow up    HISTORICAL  CHIEF COMPLAINT:  Chief Complaint  Patient presents with  . Memory Loss    rm 7 , MMSE 16  . Follow-up    wife, daughter, son    HISTORY OF PRESENT ILLNESS:   UPDATE 07/03/14: Since last visit, lost to follow up. Memory loss is progressing. Patient in denial of memory problems. He stays active working in the yard. Still driving. Wife takes care of financial issues.   New HPI (02/13/12): 81 year old male with hypercholesteremia, here for evaluation of memory problems. Past one to 2 years patient has developed progressive short-term memory problems. Patient denies any significant memory problems. Patient's wife has noticed progressive problems including remembering tasks, conversations, recent events. He does repeat himself frequent. Having difficulty taking his medications as well as helping his wife take her medications. As outpatient previously for headache and neck pain. The patient referred for a different problem.  PRIOR HPI (05/16/11): 81 year old right-handed male with hypercholesterolemia, here for evaluation of headaches. Patient reports history of cervical spine surgery in the 1980s, following which he developed chronic headaches. He thinks that his headaches are generated from his chronic neck pain. Headaches have been worse the last 6-12 months. He is seen multiple neurologists in the past. Is not sure what medications he can treated with. He describes a pain in the vertex of his head, throbbing sensation, without nausea or vomiting. He does have photophobia and phonophobia. He has tried tramadol without relief.  REVIEW OF SYSTEMS: Full 14 system review of systems performed and notable only for insomnia sleepiness restless legs confusion memory loss headache not enough sleep racing thoughts  hearing loss ringing in ears itching moles impotence incontinence.    ALLERGIES: No Known Allergies  HOME MEDICATIONS: Outpatient Prescriptions Prior to Visit  Medication Sig Dispense Refill  . aspirin EC 81 MG tablet Take 81 mg by mouth daily.    [name] omeprazole (PRILOSEC) 20 MG capsule TAKE 1 BY MOUTH DAILY 90 capsule 1  . polyethylene glycol (MIRALAX / GLYCOLAX) packet Take 17 g by mouth every evening.     . simvastatin (ZOCOR) 40 MG tablet TAKE 1 BY MOUTH AT BEDTIME 90 tablet 1  . traMADol (ULTRAM) 50 MG tablet TAKE 1 TABLET BY MOUTH TWICE A DAY AS NEEDED 60 tablet 0  . traMADol (ULTRAM) 50 MG tablet TAKE 1 TABLET BY MOUTH TWICE A DAY AS NEEDED 60 tablet 0   No facility-administered medications prior to visit.    PAST MEDICAL HISTORY: Past Medical History  Diagnosis Date  . Arthritis   . Anxiety   . Hypertension   . Hyperlipidemia   . Chronic kidney disease   . Prostate cancer     Dr [name]  . Cataract     NOS  . Impotence of organic origin   . PVD (peripheral vascular disease)   . Metabolic syndrome X   . Dysphagia     PMH of  . Diverticulosis of colon   . Hemorrhoids, internal   . Hiatal hernia   . Skin cancer   . History of colon polyps 2005    hyperplastic; Dr [name]    PAST SURGICAL HISTORY: Past Surgical History  Procedure Laterality Date  . Appendectomy  1950  . Cervical fusion    . Colonoscopy w/ polypectomy  2005    Dr [name]  .  Prostatectomy  1995    Dr.[name]; no chemo or radiation  . Tonsillectomy    . Shoulder surgery      right  . Left heart catheterization with coronary angiogram N/A 03/02/2012    Procedure: LEFT HEART CATHETERIZATION WITH CORONARY ANGIOGRAM;  Surgeon: [name], MD;  Location: [facility] CATH LAB;  Service: Cardiovascular;  Laterality: N/A;    FAMILY HISTORY: Family History  Problem Relation Age of Onset  . Lung cancer Father     smoker  . Throat cancer Father   . Kidney disease Father     nephrectomy &  partial nephrectomy  . Breast cancer Mother   . Dementia Mother   . Heart attack Mother 42  . Prostate cancer Brother   . Diabetes Maternal Grandmother   . Heart attack Maternal Grandmother     in 5s  . Heart disease Brother     x 3 (one brother with CAD in the 54s, another age 48s, third one 32s CABG)  . Stroke Neg Hx     SOCIAL HISTORY:  History   Social History  . Marital Status: Married    Spouse Name: N/A  . Number of Children: 4  . Years of Education: 12   Occupational History  . Retired     [employer]   Social History Main Topics  . Smoking status: Never Smoker   . Smokeless tobacco: Never Used  . Alcohol Use: No  . Drug Use: No  . Sexual Activity: Not on file   Other Topics Concern  . Not on file   Social History Narrative   Lives at home with wife.       PHYSICAL EXAM  GENERAL EXAM/CONSTITUTIONAL: Vitals:  Filed Vitals:   07/03/14 1412  BP: 121/73  Pulse: 62  Weight: 203 lb 3.2 oz (92.171 kg)     Body mass index is 29.59 kg/(m^2).  No exam data present  Patient is in no distress; well developed, nourished and groomed; neck is supple  CARDIOVASCULAR:  Examination of carotid arteries is normal; no carotid bruits  Regular rate and rhythm, no murmurs  Examination of peripheral vascular system by observation and palpation is normal  EYES:  Ophthalmoscopic exam of optic discs and posterior segments is normal; no papilledema or hemorrhages  MUSCULOSKELETAL:  Gait, strength, tone, movements noted in Neurologic exam below  NEUROLOGIC: MENTAL STATUS:  MMSE - [place] Exam 07/03/2014  Orientation to time 1  Orientation to Place 3  Registration 3  Attention/ Calculation 2  Recall 0  Language- name 2 objects 2  Language- repeat 0  Language- follow 3 step command 3  Language- read & follow direction 1  Write a sentence 1  Copy design 0  Total score 16    awake, alert, oriented to person; NOT PLACE OR  TIME  REGISTERS 3/3; RECALLS 0/3  DECR ATTENTION  language fluent, comprehension intact, naming intact,   fund of knowledge appropriate  CRANIAL NERVE:   2nd, 3rd, 4th, 6th - pupils equal and reactive to light, visual fields full to confrontation, extraocular muscles intact, no nystagmus  5th - facial sensation symmetric  7th - facial strength symmetric  8th - hearing intact  9th - palate elevates symmetrically, uvula midline  11th - shoulder shrug symmetric  12th - tongue protrusion midline  MOTOR:   normal bulk and tone, full strength in the BUE, BLE  SENSORY:   normal and symmetric to light touch  COORDINATION:  finger-nose-finger, fine finger movements normal  REFLEXES:   deep tendon reflexes present and symmetric  GAIT/STATION:   narrow based gait; romberg is negative    DIAGNOSTIC DATA (LABS, IMAGING, TESTING) - I reviewed patient records, labs, notes, testing and imaging myself where available.  Lab Results  Component Value Date   WBC 5.7 06/27/2014   HGB 14.6 06/27/2014   HCT 44.6 06/27/2014   MCV 89.5 06/27/2014   PLT 168.0 06/27/2014      Component Value Date/Time   NA 139 06/27/2014 0848   K 4.5 06/27/2014 0848   CL 104 06/27/2014 0848   CO2 30 06/27/2014 0848   GLUCOSE 91 06/27/2014 0848   BUN 19 06/27/2014 0848   CREATININE 1.44 06/27/2014 0848   CALCIUM 9.1 06/27/2014 0848   PROT 6.6 06/27/2014 0848   ALBUMIN 4.2 06/27/2014 0848   AST 14 06/27/2014 0848   ALT 11 06/27/2014 0848   ALKPHOS 39 06/27/2014 0848   BILITOT 0.7 06/27/2014 0848   GFRNONAA 48.89 11/02/2009 0928   GFRAA 55 11/13/2006 0000   Lab Results  Component Value Date   CHOL 161 06/27/2014   HDL 51.80 06/27/2014   LDLCALC 89 06/27/2014   TRIG 100.0 06/27/2014   CHOLHDL 3 06/27/2014   Lab Results  Component Value Date   HGBA1C 5.9 02/02/2012   Lab Results  Component Value Date   VITAMINB12 315 06/27/2014   Lab Results  Component Value Date   TSH  1.86 06/27/2014    05/31/11 MRI brain  1. Mild perisylvian atrophy. 2. Mild non-specific periventricular gliosis.    ASSESSMENT AND PLAN  81 y.o. year old male here with progressive memory loss, likely dementia.  Dx:  Moderate dementia without behavioral disturbance   PLAN: I spent 40 minutes of face to face time with patient. Greater than 50% of time was spent in counseling and coordination of care with patient. In summary we discussed:  - consider donepezil + memantine - safety issues reviewed (driving, financial, home appliances, locks) - Eat more nuts, berries, plants, vegetables. Eat less processed food, fried food, red meat, saltyfood and sugary foods. Focus on mental, social and physical stimulation. Try to get at least 7 hours sleep per day.   Meds ordered this encounter  Medications  . donepezil (ARICEPT) 5 MG tablet    Sig: Take 1 tablet (5 mg total) by mouth at bedtime.    Dispense:  30 tablet    Refill:  6   Return in about 6 months (around 01/02/2015).    [name], MD 4/40/1027, 2:53 PM Certified in Neurology, Neurophysiology and Neuroimaging  [facility] [address], [place], [place] [address] [phone]

## 2014-07-03 NOTE — Telephone Encounter (Signed)
OK X 1  My retirement date is 01/10/2015; but I will be in office only T,Weds & Thurs in  Oct-Dec.You should transition your care to another PCP by Oct 1,2016.

## 2014-07-07 ENCOUNTER — Other Ambulatory Visit: Payer: Self-pay

## 2014-07-23 ENCOUNTER — Ambulatory Visit: Payer: Medicare Other

## 2014-07-28 ENCOUNTER — Ambulatory Visit (INDEPENDENT_AMBULATORY_CARE_PROVIDER_SITE_OTHER): Payer: Medicare Other

## 2014-07-28 VITALS — BP 120/70 | Ht 71.0 in | Wt 210.0 lb

## 2014-07-28 DIAGNOSIS — Z0000 Encounter for general adult medical examination without abnormal findings: Secondary | ICD-10-CM | POA: Diagnosis not present

## 2014-07-28 DIAGNOSIS — Z23 Encounter for immunization: Secondary | ICD-10-CM

## 2014-07-28 NOTE — Progress Notes (Signed)
Medical screening examination/treatment/procedure(s) were performed by non-physician practitioner and as supervising physician I was immediately available for consultation/collaboration. I agree with above. William Hopper, MD   

## 2014-07-28 NOTE — Progress Notes (Signed)
Subjective:   Matthew [name] is a 81 y.o. male who presents for Medicare Annual/Subsequent preventive examination.  Review of Systems:  HRA assessment completed during visit; Matthew [name] Patient is here for Annual Wellness Assessment The Patient was informed that this wellness visit is to identify risk and educate on how to reduce risk for increase disease through lifestyle changes.   Personalized education given regarding risk on the problem list that are currently being medically managed;  Lifestyle changes reviewed  Fall safety review secondary to PVD and OA/ no falls   Overall safety reviewed with spouse in lieu of memory issues/Neuro MMSE 16/30 on 07/03/2014 Will review resources with spouse (Alz asso 24/7 help line) Alz.org website as well as 24/7 help line for any questions and real time assistance with behavioral issues Educated on Brain disease; spouse still feels the patient "should" remember certain things. Driving is very limited at this time;  The spouse does remind him to not "burn" trash or to remain safe when attempting other house hold chores; Educated limited due to spouse being present and slightly agitated with the focus on his memory, which he feels is fine.   Wife states he gets upset easily when re-directed; Did not remember 10 year anniversary when a family event was being planned.  States his family continues to remind him of his memory issues; educated caregiver on re-directing when possible and approach when trying to guide behavior.  Wife states he has not done anything "dangerous" but safety is an issue for family. Driving is very limited and may have to stop very soon.  Always has someone in the care at this time. Has to stop him from  trying to burn trash when there is a no burn band. Caregiver in need of education and did give referrals for assistance and will plan to fup by phone.    Neurologist started Aricept. Is tolerating at hs at this time.  PMH  and Social hx updated   Diet: eats meats, salads, soups; appetite good; BMI normal Exercise: Exercise: quit [employer] when they dropped  coverage. The children has mentioned fitness center 10.00 down and 10.00 a month, Mows the yard every week and enjoys walking and being in the yard. Educated regarding keeping his muscle tone; doesn't mind exercise. Agrees to go exercise  with wife but does not feel like he needs to. Likes to keep active; Likes to walk in the yard;    Loves his grandson which is a year old; 45 and 61yo brother and sister [place] supportive family   Screenings overdue:  Ophthalmology exam; Goes every year; no issues with eyes Immunizations; Prevnar 13 given today Shingles- completed Colonoscopy; 12/24/2003; aged out EKG 03/01/2012 Hearing: could not hear audiometer in this office; States his ears need to be cleaned out. Will make an apt for doctor to assess.  States he does have a hearing aids at home but generally does not feel he needs them. No difficulty today hearing our conversation   Dental: has all of his teeth, goes q 6 mo Current Care Team reviewed and updated Dr. Andrey Spearman Neuro Dr. Minus Breeding for PVD Dr. Verl Blalock GI  Cardiac Risk Factors include: advanced age (>53men, >80 women);dyslipidemia;hypertension;male gender     Objective:    Vitals: BP 120/70 mmHg  Ht 5\' 11"  (1.803 m)  Wt 210 lb (95.255 kg)  BMI 29.30 kg/m2  Tobacco History  Smoking status  . Never Smoker   Smokeless tobacco  .  Never Used     Counseling given: Yes   Past Medical History  Diagnosis Date  . Arthritis   . Anxiety   . Hypertension   . Hyperlipidemia   . Chronic kidney disease   . Prostate cancer     Dr [name]  . Cataract     NOS  . Impotence of organic origin   . PVD (peripheral vascular disease)   . Metabolic syndrome X   . Dysphagia     PMH of  . Diverticulosis of colon   . Hemorrhoids, internal   . Hiatal hernia   . Skin cancer   .  History of colon polyps 2005    hyperplastic; Dr [name]   Past Surgical History  Procedure Laterality Date  . Appendectomy  1950  . Cervical fusion    . Colonoscopy w/ polypectomy  2005    Dr [name]  . Prostatectomy  1995    Dr.[name]; no chemo or radiation  . Tonsillectomy    . Shoulder surgery      right  . Left heart catheterization with coronary angiogram N/A 03/02/2012    Procedure: LEFT HEART CATHETERIZATION WITH CORONARY ANGIOGRAM;  Surgeon: [name], MD;  [facility] C[facility] LAB;  Service: Cardiovascular;  Laterality: N/A;   Family History  Problem Relation Age of Onset  . Lung cancer Father     smoker  . Throat cancer Father   . Kidney disease Father     nephrectomy & partial nephrectomy  . Breast cancer Mother   . Dementia Mother   . Heart attack Mother 41  . Prostate cancer Brother   . Diabetes Maternal Grandmother   . Heart attack Maternal Grandmother     in 66s  . Heart disease Brother     x 3 (one brother with CAD in the 42s, another age 41s, third one 31s CABG)  . Stroke Neg Hx    History  Sexual Activity  . Sexual Activity: Not on file    Outpatient Encounter Prescriptions as of 07/28/2014  Medication Sig  . aspirin EC 81 MG tablet Take 81 mg by mouth daily.  [name] donepezil (ARICEPT) 5 MG tablet Take 1 tablet (5 mg total) by mouth at bedtime.  [name] omeprazole (PRILOSEC) 20 MG capsule TAKE 1 BY MOUTH DAILY  . polyethylene glycol (MIRALAX / GLYCOLAX) packet Take 17 g by mouth every evening.   . simvastatin (ZOCOR) 40 MG tablet TAKE 1 BY MOUTH AT BEDTIME  . traMADol (ULTRAM) 50 MG tablet TAKE 1 TABLET BY MOUTH TWICE A DAY AS NEEDED   No facility-administered encounter medications on file as of 07/28/2014.    Activities of Daily Living In your present state of health, do you have any difficulty performing the following activities: 07/28/2014  Hearing? N  Vision? N  Difficulty concentrating or making decisions? Y  Walking or climbing stairs? N    Dressing or bathing? N  Doing errands, shopping? N  Preparing Food and eating ? Y  Using the Toilet? N  In the past six months, have you accidently leaked urine? N  Do you have problems with loss of bowel control? N  Managing your Medications? Y  Managing your Finances? Y  Housekeeping or managing your Housekeeping? Y    Patient Care Team: [name], MD as PCP - General   Assessment:    Objective:   The goal of the wellness visit is to assist the patient how to close the gaps in care  and create a preventative care plan for the patient.  Personalized Education was given regarding:   Pt determined a personalized goal; see patient goals to maintain his health.  Assessment included:   Taking meds without issues; wife is pouring his meds, but states he does not need for her to do this.  Labs were and fup visit noted with MD if labs are due to be re-drawn.  Stress: Recommendations for managing stress if assessed as a factor; Educated on stress for the patient when confronted with memory issues and limited discussions regarding addressing these issues due to time   Safety issues reviewed;   MMSE deferred as the patient just had MMSE completed by neuro in June.  Gets a little agitated when everyone is "so focused on my memory"  Will defer this visit;   Depression screen negative;   Functional status reviewed; no losses in function x 1 year  Bowel and bladder issues assessed  End of life planning was discussed; living will completed; asked to bring it in next visit to scan    Pt determined a personalized goal; see patient goals; to maintain as he feels his health is very good.    Exercise Activities and Dietary recommendations Current Exercise Habits:: Home exercise routine, Type of exercise: walking, Time (Minutes): 45, Frequency (Times/Week): 5, Weekly Exercise (Minutes/Week): 225, Intensity: Moderate  Goals    None     Fall Risk Fall Risk  07/28/2014  02/04/2013 02/02/2012  Falls in the past year? No No No   Depression Screen PHQ 2/9 Scores 07/28/2014 02/04/2013 02/02/2012  PHQ - 2 Score 0 0 0  PHQ- 9 Score 1 - -    Cognitive Testing MMSE - Mini Mental State Exam 07/03/2014  Orientation to time 1  Orientation to Place 3  Registration 3  Attention/ Calculation 2  Recall 0  Language- name 2 objects 2  Language- repeat 0  Language- follow 3 step command 3  Language- read & follow direction 1  Write a sentence 1  Copy design 0  Total score 16    Immunization History  Administered Date(s) Administered  . Influenza Split 11/11/2010, 11/03/2011  . Influenza Whole 11/16/2006, 10/18/2007, 10/06/2008, 11/02/2009  . Influenza,inj,Quad PF,36+ Mos 11/01/2012, 11/09/2013  . Pneumococcal Conjugate-13 07/28/2014  . Pneumococcal Polysaccharide-23 11/09/2004  . Td 08/31/1995, 02/02/2012  . Zoster 11/02/2009   Screening Tests Health Maintenance  Topic Date Due  . PNA vac Low Risk Adult (2 of 2 - PCV13) 11/09/2005  . INFLUENZA VACCINE  08/11/2014  . TETANUS/TDAP  02/01/2022  . ZOSTAVAX  Completed      Plan:     Plan To take Prevnar today Will go with spouse to exercise   Will fup with wife regarding resources and further information on managing behavior of patient and re-directing as needed.  Will plan to send out information on driving.  During the course of the visit the patient was educated and counseled about the following appropriate screening and preventive services:   Vaccines to include Pneumoccal, Influenza, Hepatitis B, Td, Zostavax, HCV/ up to date  Electrocardiogram/ completed  Cardiovascular Disease/ HTN well controlled ; no new issues  Colorectal cancer screening- aged out/ no issues  Diabetes screening/ pre-diabetes discussed  Prostate Cancer Screening/ completed  Glaucoma screening/ will need exam this year  Nutrition counseling  Patient Instructions (the written plan) was given to the patient.     Wynetta Fines, RN  07/28/2014

## 2014-07-28 NOTE — Patient Instructions (Addendum)
  Mr. Matthew [name] , Thank you for taking time to come for your Medicare Wellness Visit. I appreciate your ongoing commitment to your health goals. Please review the following plan we discussed and let me know if I can assist you in the future.   The patient agrees to make apt to have ears checked if needed. Agrees to take prevnar today Will continue to educate spouse with the following information, which includes information on Driving. Will fup with spouse for understanding and assistance.   Alzheimer's Association / Family information and training  https://www.clark-whitaker.org/.asp  SAFETY; TeleconferenceOnDemand.fr.asp#howdementiaaffects  [employer]  (please mail donations to this address) [address]  [place], [place] [address] P: [phone] F: [address] [place], [place] [address] P: [phone] F: [address], [address] [place], [place] [address] P: [phone] F: [phone]  Families to call the 800 number to get information on all the resources in the area 24 hour [phone]  Can provide resources; Questions about Dementia; Support groups; Give the caregiver information regarding respite ([facility]) Call the [place] on Aging;as they oversee who gets the grant fund for certain areas ([phone]) Also have Tools to help navigate the needs of the patient  Opportunities for free educational programs online 100 support groups [place];  Just starting Early stage program and care partners for the patient and the family 12 weeks of a support group;  12 weeks of social engagement component End with Educational wrap up Cal the 800 number given for more information  All's connected  Navigator for those more comfortable with navigating the internet and allows one to develop a plan based on resources;  Website and  review the educational calander for programs that are available.  They do care consultations with appointments  Packets for physician offices/  Packets physicians can give to newly dx patients Early stage flyers  Also have examples of Safety services and jewelry   [place],  [address] P: [phone] F: [phone]    These are the goals we discussed: Goals    None      This is a list of the screening recommended for you and due dates:  Health Maintenance  Topic Date Due  . Pneumonia vaccines (2 of 2 - PCV13) 11/09/2005  . Flu Shot  08/11/2014  . Tetanus Vaccine  02/01/2022  . Shingles Vaccine  Completed

## 2014-07-31 ENCOUNTER — Telehealth: Payer: Self-pay

## 2014-07-31 NOTE — Telephone Encounter (Signed)
Call to [name] to call back regarding resources for [place]; She can speak with Annabelle Harman MSW at [facility]; [phone] or the 24 hours help line [phone] regarding driving and other issues. LVM with direct number [phone]

## 2014-08-01 ENCOUNTER — Telehealth: Payer: Self-pay | Admitting: Emergency Medicine

## 2014-08-01 NOTE — Telephone Encounter (Signed)
Received refill request for Tramadol, last ov 5/16, last refill 07/06/14

## 2014-08-01 NOTE — Telephone Encounter (Signed)
Can't be refilled until 7/26;this should be prn , not maintenance medication

## 2014-08-04 MED ORDER — TRAMADOL HCL 50 MG PO TABS: 50.0000 mg | ORAL_TABLET | Freq: Two times a day (BID) | ORAL | Status: DC

## 2014-08-04 NOTE — Telephone Encounter (Signed)
Spoke with pts wife and she stated that pt is taking tramadol every night for leg pain, as well as during the day for headaches and leg pain. I informed her that we are only aloud to send in refills every month and it was too soon when pt called in for refill. Please advise

## 2014-08-04 NOTE — Telephone Encounter (Signed)
OK but can not be filled early in future

## 2014-08-04 NOTE — Telephone Encounter (Signed)
Patient is calling back regarding his tramadol. [facility] [address]. Same as his wife. Would like to pick up from pharmacy this afternoon

## 2014-08-11 ENCOUNTER — Telehealth: Payer: Self-pay | Admitting: Diagnostic Neuroimaging

## 2014-08-11 MED ORDER — DONEPEZIL HCL 10 MG PO TABS: 10.0000 mg | ORAL_TABLET | Freq: Every day | ORAL | Status: DC

## 2014-08-11 NOTE — Telephone Encounter (Signed)
Per Dr Leta Baptist, patient may Increase Aricept to 10 mg nightly x 1 month then he will consider adding another medication. Spoke with wife and informed her of Dr [employer] instructions. Spoke with Minette Brine, [place] on [address] who stated dr must e scribe new dose. Dr Leta Baptist informed and agreed.

## 2014-08-11 NOTE — Telephone Encounter (Signed)
Patient's wife called stating she unsure whether to continue the donepezil (ARICEPT) 5 MG tablet or if Dr Leta Baptist was going to add another medication also.He has started second refill Aricept. She can be reached at [phone].

## 2014-08-25 NOTE — Telephone Encounter (Signed)
Call to fup on AWV; Stated that [place] was increased and she thinks that is helping some. Looking forward to going to the beach and the dtr is driving. Spouse may have left tkr after school starts and is in process of developing post op plan.  Has resources for now /

## 2014-09-24 ENCOUNTER — Ambulatory Visit (INDEPENDENT_AMBULATORY_CARE_PROVIDER_SITE_OTHER): Payer: Medicare Other | Admitting: Internal Medicine

## 2014-09-24 ENCOUNTER — Other Ambulatory Visit (INDEPENDENT_AMBULATORY_CARE_PROVIDER_SITE_OTHER): Payer: Medicare Other

## 2014-09-24 ENCOUNTER — Encounter: Payer: Self-pay | Admitting: Internal Medicine

## 2014-09-24 VITALS — BP 142/74 | HR 52 | Temp 98.4°F | Resp 16 | Ht 71.0 in | Wt 199.0 lb

## 2014-09-24 DIAGNOSIS — G609 Hereditary and idiopathic neuropathy, unspecified: Secondary | ICD-10-CM | POA: Diagnosis not present

## 2014-09-24 DIAGNOSIS — R634 Abnormal weight loss: Secondary | ICD-10-CM | POA: Diagnosis not present

## 2014-09-24 DIAGNOSIS — G8929 Other chronic pain: Secondary | ICD-10-CM

## 2014-09-24 DIAGNOSIS — R1013 Epigastric pain: Secondary | ICD-10-CM

## 2014-09-24 DIAGNOSIS — Z23 Encounter for immunization: Secondary | ICD-10-CM | POA: Diagnosis not present

## 2014-09-24 LAB — HEPATIC FUNCTION PANEL
ALT: 11 U/L (ref 0–53)
AST: 15 U/L (ref 0–37)
Albumin: 4.3 g/dL (ref 3.5–5.2)
Alkaline Phosphatase: 44 U/L (ref 39–117)
BILIRUBIN DIRECT: 0 mg/dL (ref 0.0–0.3)
BILIRUBIN TOTAL: 0.4 mg/dL (ref 0.2–1.2)
Total Protein: 7 g/dL (ref 6.0–8.3)

## 2014-09-24 LAB — CBC WITH DIFFERENTIAL/PLATELET
BASOS PCT: 0.3 % (ref 0.0–3.0)
Basophils Absolute: 0 10*3/uL (ref 0.0–0.1)
EOS ABS: 0.1 10*3/uL (ref 0.0–0.7)
Eosinophils Relative: 1.2 % (ref 0.0–5.0)
HCT: 42.7 % (ref 39.0–52.0)
HEMOGLOBIN: 14 g/dL (ref 13.0–17.0)
LYMPHS ABS: 2.6 10*3/uL (ref 0.7–4.0)
Lymphocytes Relative: 33.3 % (ref 12.0–46.0)
MCHC: 32.7 g/dL (ref 30.0–36.0)
MCV: 90.4 fl (ref 78.0–100.0)
MONO ABS: 0.6 10*3/uL (ref 0.1–1.0)
Monocytes Relative: 7.9 % (ref 3.0–12.0)
NEUTROS PCT: 57.3 % (ref 43.0–77.0)
Neutro Abs: 4.4 10*3/uL (ref 1.4–7.7)
Platelets: 173 10*3/uL (ref 150.0–400.0)
RBC: 4.72 Mil/uL (ref 4.22–5.81)
RDW: 13.9 % (ref 11.5–15.5)
WBC: 7.7 10*3/uL (ref 4.0–10.5)

## 2014-09-24 LAB — TSH: TSH: 2.01 u[IU]/mL (ref 0.35–4.50)

## 2014-09-24 LAB — BASIC METABOLIC PANEL
BUN: 31 mg/dL — AB (ref 6–23)
CHLORIDE: 107 meq/L (ref 96–112)
CO2: 29 mEq/L (ref 19–32)
CREATININE: 1.56 mg/dL — AB (ref 0.40–1.50)
Calcium: 9.5 mg/dL (ref 8.4–10.5)
GFR: 45.44 mL/min — AB (ref >60.00)
GLUCOSE: 93 mg/dL (ref 70–99)
Potassium: 4.9 mEq/L (ref 3.5–5.1)
Sodium: 143 mEq/L (ref 135–145)

## 2014-09-24 LAB — VITAMIN B12: VITAMIN B 12: 249 pg/mL (ref 211–911)

## 2014-09-24 LAB — LIPASE: LIPASE: 25 U/L (ref 11.0–59.0)

## 2014-09-24 LAB — AMYLASE: AMYLASE: 48 U/L (ref 27–131)

## 2014-09-24 MED ORDER — GABAPENTIN 100 MG PO CAPS: ORAL_CAPSULE | ORAL | Status: DC

## 2014-09-24 NOTE — Progress Notes (Signed)
   Subjective:    Patient ID: Matthew [name], male    DOB: 1930/07/21, 81 y.o.   MRN: [id number]  HPI    He's had dull epigastric abdominal discomfort for one month. The initial symptom was dysuria for which his wife gave him Azo; the discomfort began after taking this medication. He's had associated nausea and bloating but no other GI or GU symptoms. He does have constipation and takes MiraLAX every other day for bowel movement.  He will work outside all day without coming in to drink or drink. He has lost 11 pounds since 07/28/14. In the last 16 months his weight has varied from 200 pounds in May 2015 to the high in July of 210 pounds. He now weighs 199 pounds.  Past history is positive for prostate cancer and chronic kidney disease. His last colonoscopy and upper endoscopy were 12/24/2003. He did have duodenitis; he also had esophageal dilation with a #56 [place] Maloney catheter. He did have sigmoid colon polyps.   Review of Systems  There is no significant cough, sputum production,hemoptysis, wheezing,or  paroxysmal nocturnal dyspnea. Significant dyspepsia, dysphagia, melena, rectal bleeding, or persistently small caliber stools are not present. New progress he denies clay colored stool or: Colored urine. Dysuria, pyuria, hematuria, frequency, nocturia or polyuria are denied @ this time. His wife states he has burning in his feet at night.     Objective:   Physical Exam Pertinent or positive findings include: He has pattern alopecia. He is slightly tender in the epigastrium. Knee DTRs 0-1/2+.  General appearance :adequately nourished; in no distress.  Eyes: No conjunctival inflammation or scleral icterus is present.  Oral exam:  Lips and gums are healthy appearing.There is no oropharyngeal erythema or exudate noted. Dental hygiene is good.  Heart:  Normal rate and regular rhythm. S1 and S2 normal without gallop, murmur, click, rub or other extra sounds    Lungs:Chest clear to  auscultation; no wheezes, rhonchi,rales ,or rubs present.No increased work of breathing.   Abdomen: bowel sounds normal, soft and non-tender without masses, organomegaly or hernias noted.  No guarding or rebound.   Vascular : all pulses equal ; no bruits present.  Skin:Warm & dry.  Intact without suspicious lesions or rashes ; no tenting or jaundice   Lymphatic: No lymphadenopathy is noted about the head, neck, axilla.   Neuro: Strength, tone  normal.      Assessment & Plan:  #1 epigastric abdominal pain  #2 past medical history of duodenitis and esophageal stricture in 2005  Plan: CBC & dif Amylase Lipase Hepatic panel H pylori

## 2014-09-24 NOTE — Progress Notes (Signed)
Pre visit review using our clinic review tool, if applicable. No additional management support is needed unless otherwise documented below in the visit note. 

## 2014-09-24 NOTE — Patient Instructions (Signed)
Reflux of gastric acid may be asymptomatic as this may occur mainly during sleep.The triggers for reflux  include stress; the "aspirin family" ; alcohol; peppermint; and caffeine (coffee, tea, cola, and chocolate). The aspirin family would include aspirin and the nonsteroidal agents such as ibuprofen &  Naproxen. Tylenol would not cause reflux. If having symptoms ; food & drink should be avoided for @ least 2 hours before going to bed.  Take the protein pump inhibitor 30 minutes before breakfast and 30 minutes before the evening meal for 8 weeks then go back to once a day  30 minutes before breakfast.   Your next office appointment will be determined based upon review of your pending labs.  Those written interpretation of the lab results and instructions will be transmitted to you by My Chart  Critical results will be called.   Followup as needed for any active or acute issue. Please report any significant change in your symptoms.

## 2014-09-25 ENCOUNTER — Other Ambulatory Visit: Payer: Self-pay | Admitting: Emergency Medicine

## 2014-09-25 ENCOUNTER — Other Ambulatory Visit: Payer: Self-pay | Admitting: Internal Medicine

## 2014-09-25 MED ORDER — TRAMADOL HCL 50 MG PO TABS: 50.0000 mg | ORAL_TABLET | Freq: Two times a day (BID) | ORAL | Status: DC

## 2014-09-25 NOTE — Telephone Encounter (Signed)
Tradomol faxed to pharm.

## 2014-11-03 ENCOUNTER — Telehealth: Payer: Self-pay | Admitting: *Deleted

## 2014-11-03 NOTE — Telephone Encounter (Signed)
RX1 I am transitioning to full retirement as of 01/09/2015. Over the next several weeks; I shall be in the clinic on an abbreviated schedule to complete teaching obligations to currently scheduled Nurse Practitioner Students. You should transfer your primary care to another primary care provider to guarantee continuity of care. Dr Billey Gosling has joined Wayne here @ [facility] ; I enthusiastically recommend her to you because of her clinical skills and compassion. Also Terri Piedra, NP @ [facility] is an outstanding Primary Care practitioner who truly cares about his patients. If you need medication refills;a physical or update of your medical record ;this would be an excellent opportunity to schedule an appointment with Dr Quay Burow or Marya Amsler.  It has been an Surveyor, minerals and blessing to have served you as your physician. I wish you the best in the future. [employer]

## 2014-11-03 NOTE — Telephone Encounter (Signed)
Receive call from pt wife needing refill on tramadol. Inform wife md is out of office today will give them call back tomorrow with his response...Matthew [name]

## 2014-11-04 MED ORDER — TRAMADOL HCL 50 MG PO TABS: 50.0000 mg | ORAL_TABLET | Freq: Two times a day (BID) | ORAL | Status: DC

## 2014-11-04 NOTE — Telephone Encounter (Signed)
Notified pt wife [facility]ill has been [facility] spoke with Beth/pharmacist.../lmb

## 2014-11-07 ENCOUNTER — Encounter: Payer: Self-pay | Admitting: Internal Medicine

## 2014-11-07 ENCOUNTER — Other Ambulatory Visit (INDEPENDENT_AMBULATORY_CARE_PROVIDER_SITE_OTHER): Payer: Medicare Other

## 2014-11-07 ENCOUNTER — Ambulatory Visit (INDEPENDENT_AMBULATORY_CARE_PROVIDER_SITE_OTHER): Payer: Medicare Other | Admitting: Internal Medicine

## 2014-11-07 VITALS — BP 120/68 | HR 51 | Temp 97.9°F | Ht 71.0 in | Wt 201.2 lb

## 2014-11-07 DIAGNOSIS — F0390 Unspecified dementia without behavioral disturbance: Secondary | ICD-10-CM | POA: Diagnosis not present

## 2014-11-07 DIAGNOSIS — G609 Hereditary and idiopathic neuropathy, unspecified: Secondary | ICD-10-CM | POA: Diagnosis not present

## 2014-11-07 DIAGNOSIS — R7989 Other specified abnormal findings of blood chemistry: Secondary | ICD-10-CM

## 2014-11-07 DIAGNOSIS — R748 Abnormal levels of other serum enzymes: Secondary | ICD-10-CM

## 2014-11-07 DIAGNOSIS — R1033 Periumbilical pain: Secondary | ICD-10-CM

## 2014-11-07 DIAGNOSIS — F03B0 Unspecified dementia, moderate, without behavioral disturbance, psychotic disturbance, mood disturbance, and anxiety: Secondary | ICD-10-CM

## 2014-11-07 LAB — LIPASE: LIPASE: 26 U/L (ref 11.0–59.0)

## 2014-11-07 LAB — BASIC METABOLIC PANEL
BUN: 27 mg/dL — AB (ref 6–23)
CO2: 30 mEq/L (ref 19–32)
CREATININE: 1.49 mg/dL (ref 0.40–1.50)
Calcium: 9.6 mg/dL (ref 8.4–10.5)
Chloride: 105 mEq/L (ref 96–112)
GFR: 47.9 mL/min — AB (ref >60.00)
GLUCOSE: 90 mg/dL (ref 70–99)
POTASSIUM: 4.9 meq/L (ref 3.5–5.1)
Sodium: 142 mEq/L (ref 135–145)

## 2014-11-07 MED ORDER — PREGABALIN 25 MG PO CAPS: 25.0000 mg | ORAL_CAPSULE | Freq: Two times a day (BID) | ORAL | Status: DC

## 2014-11-07 NOTE — Progress Notes (Signed)
Pre visit review using our clinic review tool, if applicable. No additional management support is needed unless otherwise documented below in the visit note. 

## 2014-11-07 NOTE — Patient Instructions (Signed)
The GI referral will be scheduled and you'll be notified of the time.Please call the Referral Co-Ordinator @ [phone] if you have not been notified of appointment time within 7-10 days.  The triggers for reflux  include stress; the "aspirin family" ; alcohol; peppermint; and caffeine (coffee, tea, cola, and chocolate). The aspirin family would include aspirin and the nonsteroidal agents such as ibuprofen &  Naproxen. Tylenol would not cause reflux. If having symptoms ; food & drink should be avoided for @ least 2 hours before going to bed.

## 2014-11-07 NOTE — Progress Notes (Signed)
   Subjective:    Patient ID: PADEN SENGER, male    DOB: [birth year], 81 y.o.   MRN: [id number]  HPI  Since his last visit 09/24/14 he continues to have abdominal discomfort which is described as intermittent,typically 30 minutes after meal associated with nausea. There is no associated vomiting; but he describes being "sick on the stomach". His wife will treat this with Dramamine. At this time he has no pain but "feels sickish". He localizes symptoms to the peri or infra umbilical area.  He has no other cardiopulmonary, GI, or GU symptoms.  Gabapentin is not helping his neuropathic pain.  He saw his Neurologist in June and Aricept was started. At that time score on Mini-Mental Status exam was 16 out of 30. The Aricept has been increased.  Recent labs revealed creatinine elevation of 1.56.   Review of Systems  There is no significant cough, sputum production,hemoptysis, wheezing,or  paroxysmal nocturnal dyspnea. Unexplained weight loss, significant dyspepsia, dysphagia, melena, rectal bleeding, or persistently small caliber stools are not present. Dysuria, pyuria, hematuria, frequency, nocturia or polyuria are denied.    Objective:   Physical Exam  Pertinent or positive findings include: Pattern alopecia is present. Pupils are small. There is some tenderness in the periumbilical area. There is asymmetry of the thoracic musculature; right greater than left. He has slight asymmetric crepitus of the knees. There are mild mixed DIP/PIP arthritic changes in the hands. He is interactive and laughing as he discusses his history. He has difficulty following simple commands.  General appearance :adequately nourished; in no distress.  Eyes: No conjunctival inflammation or scleral icterus is present.  Oral exam:  Lips and gums are healthy appearing.There is no oropharyngeal erythema or exudate noted. Dental hygiene is good.  Heart:  Normal rate and regular rhythm. S1 and S2 normal without gallop,  murmur, click, rub or other extra sounds    Lungs:Chest clear to auscultation; no wheezes, rhonchi,rales ,or rubs present.No increased work of breathing.   Abdomen: bowel sounds normal, soft without masses, organomegaly or hernias noted.  No guarding or rebound. No flank tenderness to percussion.  Vascular : all pulses equal ; no bruits present.  Skin:Warm & dry.  Intact without suspicious lesions or rashes ; no tenting or jaundice   Lymphatic: No lymphadenopathy is noted about the head, neck, axilla.   Neuro: Strength, tone & DTRs normal.     Assessment & Plan:  #1 vague stomach discomfort with nausea, especially post prandially which mitigates against Aricept adverse effect as etiology  #2 dementia without behavioral disorder. He was started on Aricept in June; this possibly could be causing some of the nausea component.  #3 peripheral neuropathy; trial of Lyrica requested by family.  See orders

## 2014-11-10 ENCOUNTER — Encounter: Payer: Self-pay | Admitting: Physician Assistant

## 2014-11-19 ENCOUNTER — Ambulatory Visit
Admission: RE | Admit: 2014-11-19 | Discharge: 2014-11-19 | Disposition: A | Discharge status: Home / Self Care | Payer: Medicare Other | Source: Ambulatory Visit | Attending: Internal Medicine | Admitting: Internal Medicine

## 2014-11-19 DIAGNOSIS — R1033 Periumbilical pain: Secondary | ICD-10-CM

## 2014-11-25 ENCOUNTER — Telehealth: Payer: Self-pay | Admitting: *Deleted

## 2014-11-25 NOTE — Telephone Encounter (Signed)
Receive call pt is wanting U/S results. Wife states he is still in pain not feeling better at all. Gave wife md notes on U/S. Wife states that the GI appt not until 11/30. He has made appt to see Dr. Quay Burow on thurs. Advise wife to keep appt if he is not feeling better, and if he starts running fever or the pain get worse to go to ER...Matthew [name]

## 2014-11-27 ENCOUNTER — Encounter: Payer: Self-pay | Admitting: Internal Medicine

## 2014-11-27 ENCOUNTER — Ambulatory Visit (INDEPENDENT_AMBULATORY_CARE_PROVIDER_SITE_OTHER): Payer: Medicare Other | Admitting: Internal Medicine

## 2014-11-27 VITALS — BP 122/74 | HR 55 | Temp 97.7°F | Resp 16 | Wt 199.0 lb

## 2014-11-27 DIAGNOSIS — E785 Hyperlipidemia, unspecified: Secondary | ICD-10-CM

## 2014-11-27 DIAGNOSIS — R1013 Epigastric pain: Secondary | ICD-10-CM | POA: Diagnosis not present

## 2014-11-27 DIAGNOSIS — F03B0 Unspecified dementia, moderate, without behavioral disturbance, psychotic disturbance, mood disturbance, and anxiety: Secondary | ICD-10-CM

## 2014-11-27 DIAGNOSIS — G609 Hereditary and idiopathic neuropathy, unspecified: Secondary | ICD-10-CM | POA: Diagnosis not present

## 2014-11-27 DIAGNOSIS — F0390 Unspecified dementia without behavioral disturbance: Secondary | ICD-10-CM | POA: Diagnosis not present

## 2014-11-27 MED ORDER — PREGABALIN 50 MG PO CAPS: 50.0000 mg | ORAL_CAPSULE | Freq: Three times a day (TID) | ORAL | Status: DC

## 2014-11-27 NOTE — Progress Notes (Signed)
Pre visit review using our clinic review tool, if applicable. No additional management support is needed unless otherwise documented below in the visit note. 

## 2014-11-27 NOTE — Patient Instructions (Addendum)
  We have reviewed your prior records including labs and tests today.  All other Health Maintenance issues reviewed.   All recommended immunizations and age-appropriate screenings are up-to-date.  No immunizations administered today.   Medications reviewed and updated. We have increased the Lyrica to 50 mg twice daily.  Monitor closely for side effects.  Your prescription(s) have been submitted to your pharmacy. Please take as directed and contact our office if you believe you are having problem(s) with the medication(s).   Please schedule followup in 4 weeks

## 2014-11-27 NOTE — Assessment & Plan Note (Signed)
Last lipid panel well controlled Continue simvastatin 40 mg daily Eats a healthy diet

## 2014-11-27 NOTE — Assessment & Plan Note (Addendum)
Taking Lyrica 25 mg twice daily - no side effects, but not change in pain Increase lyrica to 50 mg twice daily - reviewed possible side effects Advised him to check his feet daily Follow up in 4 weeks

## 2014-11-27 NOTE — Assessment & Plan Note (Addendum)
Taking Aricept 10 mg at bedtime Following with neurology

## 2014-11-27 NOTE — Progress Notes (Signed)
Subjective:    Patient ID: Matthew [name], male    DOB: [birth year], 81 y.o.   MRN: [id number]  HPI He is here to establish with a new pcp.    Dementia, moderate:  He is taking aricept nightly.  He is following with neurology.  Peripheral neuropathy, hereditary:  He has a burning sensation in his feet.  He is taking lyrica twice daily (started in October) and tramadol twice daily.  He did not have an response with gabapentin.  He and his wife denies any side effects from the Lyrica.  He has not noticed any change in the feet pain with the Lyrica and they wonder about increasing the dose.    Head pain:  He takes tramadol for the neuropathy and headaches.  He follows with neurology.  He has constant head pain mostly in his eyes.  He has pain on the top of the head too.  Neurology was unsure why he was having the headaches.   Hyperlipidemia: He is taking his medication daily. He is compliant with a low fat/cholesterol diet. He is not exercising regularly. He denies myalgias.   Abdominal pain:  He has epigastric pain usually when he goes to bed or in the evening.  Sometimes he gets pain in different areas of his abdomen.  He does not think food makes the pain worse/better.  His wife thinks the pain is worse after taking his evening medication.  He has an appointment with GI later this month.  He has been taking omeprazole twice daily and they do not think it is helping.  He sometimes feels the pain is a burning sensation.  Medications and allergies reviewed with patient and updated if appropriate.  Patient Active Problem List   Diagnosis Date Noted  . Hereditary and idiopathic peripheral neuropathy 11/07/2014  . Elevated serum creatinine 02/04/2013  . Abnormal EKG 02/16/2012  . Moderate dementia without behavioral disturbance 11/02/2009  . SKIN CANCER, HX OF 11/02/2009  . ANXIETY 02/03/2007  . HEMORRHOIDS, INTERNAL 02/03/2007  . ARTHRITIS 02/03/2007  . SLEEP APNEA 02/03/2007  .  Hyperlipidemia 11/13/2006  . METABOLIC SYNDROME X 99991111  . PVD 11/13/2006  . PROSTATE CANCER, HX OF 11/13/2006  . KIDNEY DISEASE, CHRONIC NOS 03/27/2006  . 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 OSTEOARTHROSIS NOS, UNSPECIFIED SITE 03/27/2006  . COLONIC POLYPS, HYPERPLASTIC 12/24/2003  . HIATAL HERNIA 12/24/2003  . Diverticulosis of large intestine 12/24/2003    Current Outpatient Prescriptions on File Prior to Visit  Medication Sig Dispense Refill  . aspirin EC 81 MG tablet Take 81 mg by mouth daily.    [name] donepezil (ARICEPT) 10 MG tablet Take 1 tablet (10 mg total) by mouth at bedtime. 30 tablet 12  . omeprazole (PRILOSEC) 20 MG capsule TAKE 1 BY MOUTH DAILY 90 capsule 3  . polyethylene glycol (MIRALAX / GLYCOLAX) packet Take 17 g by mouth every evening.     . pregabalin (LYRICA) 25 MG capsule Take 1 capsule (25 mg total) by mouth 2 (two) times daily. 60 capsule 1  . simvastatin (ZOCOR) 40 MG tablet TAKE 1 BY MOUTH AT BEDTIME 90 tablet 3  . traMADol (ULTRAM) 50 MG tablet Take 1 tablet (50 mg total) by mouth 2 (two) times daily. 60 tablet 0   No current facility-administered medications on file prior to visit.    Past Medical History  Diagnosis Date  . Arthritis   . Anxiety   . Hypertension   . Hyperlipidemia   . Chronic  kidney disease   . Prostate cancer [facility])     Dr [name]  . Cataract     NOS  . Impotence of organic origin   . PVD (peripheral vascular disease) ([place])   . Metabolic syndrome X   . Dysphagia     PMH of  . Diverticulosis of colon   . Hemorrhoids, internal   . Hiatal hernia   . Skin cancer   . History of colon polyps 2005    hyperplastic; Dr [name]    Past Surgical History  Procedure Laterality Date  . Appendectomy  1950  . Cervical fusion    . Colonoscopy w/ polypectomy  2005    Dr [name]  . Prostatectomy  1995    Dr.[name]; no chemo or radiation  . Tonsillectomy    . Shoulder surgery      right  . Left heart catheterization  with coronary angiogram N/A 03/02/2012    Procedure: LEFT HEART CATHETERIZATION WITH CORONARY ANGIOGRAM;  Surgeon: [name], MD;  Location: [facility] CATH LAB;  Service: Cardiovascular;  Laterality: N/A;    Social History   Social History  . Marital Status: Married    Spouse Name: N/A  . Number of Children: 4  . Years of Education: 12   Occupational History  . Retired     [employer]   Social History Main Topics  . Smoking status: Never Smoker   . Smokeless tobacco: Never Used  . Alcohol Use: No  . Drug Use: No  . Sexual Activity: Not on file   Other Topics Concern  . Not on file   Social History Narrative   Lives at home with wife.      Review of Systems  Constitutional: Negative for fever, chills and appetite change (varies).  Respiratory: Negative for cough, shortness of breath and wheezing.   Cardiovascular: Negative for chest pain, palpitations and leg swelling.  Gastrointestinal: Positive for nausea and abdominal pain (burning sensation at times).  Neurological: Positive for dizziness (occasional), light-headedness (if he gets up quickly) and headaches.  Psychiatric/Behavioral: Negative for sleep disturbance.       Objective:   Filed Vitals:   11/27/14 0834  BP: 122/74  Pulse: 55  Temp: 97.7 F (36.5 C)  Resp: 16   Filed Weights   11/27/14 0834  Weight: 199 lb (90.266 kg)   Body mass index is 27.77 kg/(m^2).   Physical Exam  Constitutional: He appears well-developed and well-nourished.  Neck: Neck supple. No tracheal deviation present. No thyromegaly present.  Cardiovascular: Normal rate and regular rhythm.   No murmur heard. Pulmonary/Chest: Effort normal and breath sounds normal. No respiratory distress. He has no wheezes. He has no rales.  Abdominal: Soft. There is tenderness (mild in epgiastric area - "feels sickish"). There is no rebound and no guarding.  Musculoskeletal: He exhibits no edema.  Lymphadenopathy:    He has no  cervical adenopathy.  Psychiatric: He has a normal mood and affect. His behavior is normal.          Assessment & Plan:   See Problem List   Abdominal pain: ? Related to one of his medications vs GERD Continue omeprazole twice daily-ideally take 30 minutes before breakfast and 30 minutes before Take Tums as needed Discussed foods and habits the increase pain-advised his wife to start monitoring Trial of taking simvastatin closer to dinnertime. Advised her to monitor it is related more to the aricept that he takes at night Has an appointment with  GI later this month   Follow up in 4 weeks

## 2014-12-09 ENCOUNTER — Telehealth: Payer: Self-pay | Admitting: Internal Medicine

## 2014-12-09 MED ORDER — TRAMADOL HCL 50 MG PO TABS: 50.0000 mg | ORAL_TABLET | Freq: Two times a day (BID) | ORAL | Status: DC

## 2014-12-09 NOTE — Telephone Encounter (Signed)
rx printed

## 2014-12-09 NOTE — Telephone Encounter (Signed)
MD out of office pls advise on refill.../lmb 

## 2014-12-09 NOTE — Telephone Encounter (Signed)
Patient requesting refill for traMADol (ULTRAM) 50 MG tablet [id number] [facility] [address]

## 2014-12-10 ENCOUNTER — Other Ambulatory Visit (INDEPENDENT_AMBULATORY_CARE_PROVIDER_SITE_OTHER): Payer: Medicare Other

## 2014-12-10 ENCOUNTER — Encounter: Payer: Self-pay | Admitting: Physician Assistant

## 2014-12-10 ENCOUNTER — Ambulatory Visit (INDEPENDENT_AMBULATORY_CARE_PROVIDER_SITE_OTHER): Payer: Medicare Other | Admitting: Physician Assistant

## 2014-12-10 VITALS — BP 106/68 | HR 70 | Ht 71.0 in | Wt 201.6 lb

## 2014-12-10 DIAGNOSIS — R1033 Periumbilical pain: Secondary | ICD-10-CM

## 2014-12-10 DIAGNOSIS — R1013 Epigastric pain: Secondary | ICD-10-CM

## 2014-12-10 DIAGNOSIS — R195 Other fecal abnormalities: Secondary | ICD-10-CM

## 2014-12-10 LAB — CBC WITH DIFFERENTIAL/PLATELET
Basophils Absolute: 0 10*3/uL (ref 0.0–0.1)
Basophils Relative: 0.4 % (ref 0.0–3.0)
EOS PCT: 1.3 % (ref 0.0–5.0)
Eosinophils Absolute: 0.1 10*3/uL (ref 0.0–0.7)
HCT: 42.7 % (ref 39.0–52.0)
HEMOGLOBIN: 14.1 g/dL (ref 13.0–17.0)
LYMPHS PCT: 30.2 % (ref 12.0–46.0)
Lymphs Abs: 2.2 10*3/uL (ref 0.7–4.0)
MCHC: 33.1 g/dL (ref 30.0–36.0)
MCV: 89.7 fl (ref 78.0–100.0)
MONO ABS: 0.7 10*3/uL (ref 0.1–1.0)
MONOS PCT: 9.5 % (ref 3.0–12.0)
Neutro Abs: 4.2 10*3/uL (ref 1.4–7.7)
Neutrophils Relative %: 58.6 % (ref 43.0–77.0)
Platelets: 163 10*3/uL (ref 150.0–400.0)
RBC: 4.76 Mil/uL (ref 4.22–5.81)
RDW: 13.3 % (ref 11.5–15.5)
WBC: 7.2 10*3/uL (ref 4.0–10.5)

## 2014-12-10 LAB — BASIC METABOLIC PANEL
BUN: 20 mg/dL (ref 6–23)
CALCIUM: 9.2 mg/dL (ref 8.4–10.5)
CO2: 29 meq/L (ref 19–32)
Chloride: 106 mEq/L (ref 96–112)
Creatinine, Ser: 1.41 mg/dL (ref 0.40–1.50)
GFR: 51.04 mL/min — ABNORMAL LOW (ref >60.00)
GLUCOSE: 93 mg/dL (ref 70–99)
Potassium: 4.5 mEq/L (ref 3.5–5.1)
SODIUM: 141 meq/L (ref 135–145)

## 2014-12-10 LAB — HEPATIC FUNCTION PANEL
ALT: 10 U/L (ref 0–53)
AST: 13 U/L (ref 0–37)
Albumin: 4 g/dL (ref 3.5–5.2)
Alkaline Phosphatase: 41 U/L (ref 39–117)
BILIRUBIN DIRECT: 0 mg/dL (ref 0.0–0.3)
Total Bilirubin: 0.4 mg/dL (ref 0.2–1.2)
Total Protein: 6.6 g/dL (ref 6.0–8.3)

## 2014-12-10 LAB — LIPASE: Lipase: 26 U/L (ref 11.0–59.0)

## 2014-12-10 NOTE — Progress Notes (Addendum)
Patient ID: Matthew [name], male   DOB: 1930-06-09, 81 y.o.   MRN: [id number]    HPI:  Matthew [name] is a 81 y.o.   male referred by [name], MD for evaluation of epigastric and periumbilical pain. Matthew [name] has been seen in the past by Dr. [name]. He has a past medical history of hiatal hernia, diverticulosis, GERD, colon polyps, hemorrhoids, moderate dementia, peripheral neuropathy, osteoarthritis, chronic kidney disease, hyperlipidemia, metabolic syndrome, anxiety, sleep apnea, and prostate cancer. His last colonoscopy was in December 2005. A sigmoid polyp was removed and no further surveillance was recommended. He also had an EGD on 12/24/2003 at which time he was noted to have a hiatal hernia and duodenitis without hemorrhage.  He presents today with complaint of a 2 month history of vague pain from the periumbilical area to the epigastric area. The pain is constant and is not alleviated nor exacerbated with ingestion of food. It is not associated with nausea or vomiting. It is not relieved with defecation. It is not worse with movement. His appetite has been good. He states that the onset his appetite was diminished and he lost 6 pounds but his appetite has come back and he has gained 6 pounds back. He has no associated fever or chills or night sweats. He describes the pain as a burning sick feeling. He is on omeprazole for GERD but does not use it regularly. He has been getting some heartburn several days per week. He has no dysphasia. He denies any melena or bright red blood per rectum.   Past Medical History  Diagnosis Date  . Arthritis   . Anxiety   . Hypertension   . Hyperlipidemia   . Chronic kidney disease   . Prostate cancer [facility])     Dr [name]  . Cataract     NOS  . Impotence of organic origin   . PVD (peripheral vascular disease) ([place])   . Metabolic syndrome X   . Dysphagia     PMH of  . Diverticulosis of colon   . Hemorrhoids, internal   . Hiatal hernia   . Skin  cancer   . History of colon polyps 2005    hyperplastic; Dr [name]    Past Surgical History  Procedure Laterality Date  . Appendectomy  1950  . Cervical fusion    . Colonoscopy w/ polypectomy  2005    Dr [name]  . Prostatectomy  1995    Dr.[name]; no chemo or radiation  . Tonsillectomy    . Shoulder surgery      right  . Left heart catheterization with coronary angiogram N/A 03/02/2012    Procedure: LEFT HEART CATHETERIZATION WITH CORONARY ANGIOGRAM;  Surgeon: [name], MD;  Location: [facility] CATH LAB;  Service: Cardiovascular;  Laterality: N/A;   Family History  Problem Relation Age of Onset  . Lung cancer Father     smoker  . Throat cancer Father   . Kidney disease Father     nephrectomy & partial nephrectomy  . Breast cancer Mother   . Dementia Mother   . Heart attack Mother 37  . Prostate cancer Brother   . Diabetes Maternal Grandmother   . Heart attack Maternal Grandmother     in 89s  . Heart disease Brother     x 3 (one brother with CAD in the 29s, another age 38s, third one 2s CABG)  . Stroke Neg Hx    Social History  Substance Use Topics  .  Smoking status: Never Smoker   . Smokeless tobacco: Never Used  . Alcohol Use: No   Current Outpatient Prescriptions  Medication Sig Dispense Refill  . aspirin EC 81 MG tablet Take 81 mg by mouth daily.    [name] donepezil (ARICEPT) 10 MG tablet Take 1 tablet (10 mg total) by mouth at bedtime. 30 tablet 12  . omeprazole (PRILOSEC) 20 MG capsule TAKE 1 BY MOUTH DAILY 90 capsule 3  . polyethylene glycol (MIRALAX / GLYCOLAX) packet Take 17 g by mouth every evening.     . pregabalin (LYRICA) 50 MG capsule Take 1 capsule (50 mg total) by mouth 3 (three) times daily. 60 capsule 5  . simvastatin (ZOCOR) 40 MG tablet TAKE 1 BY MOUTH AT BEDTIME 90 tablet 3  . traMADol (ULTRAM) 50 MG tablet Take 1 tablet (50 mg total) by mouth 2 (two) times daily. 60 tablet 0   No current facility-administered medications for this visit.    No Known Allergies   Review of Systems: Gen: Denies any fever, chills, sweats, anorexia, fatigue, weakness, malaise, weight loss, and sleep disorder CV: Denies chest pain, angina, palpitations, syncope, orthopnea, PND, peripheral edema, and claudication. Resp: Denies dyspnea at rest, dyspnea with exercise, cough, sputum, wheezing, coughing up blood, and pleurisy. GI: Denies vomiting blood, jaundice, and fecal incontinence.   Denies dysphagia or odynophagia. GU : Denies urinary burning, blood in urine, urinary frequency, urinary hesitancy, nocturnal urination, and urinary incontinence. MS: Denies joint pain, limitation of movement, and swelling, stiffness, low back pain, extremity pain. Denies muscle weakness, cramps, atrophy.  Derm: Denies rash, itching, dry skin, hives, moles, warts, or unhealing ulcers.  Psych: Denies depression, anxiety, memory loss, suicidal ideation, hallucinations, paranoia, and confusion. Heme: Denies bruising, bleeding, and enlarged lymph nodes. Neuro:  Denies any headaches, dizziness, paresthesias. Endo:  Denies any problems with DM, thyroid, adrenal function  Studies: US Abdomen Complete  11/19/2014  CLINICAL DATA:  Periumbilical pain. EXAM: ULTRASOUND ABDOMEN COMPLETE COMPARISON:  None. FINDINGS: Gallbladder: No gallstones or wall thickening visualized. No sonographic Murphy sign noted. Common bile duct: Diameter: 3.5 mm Liver: Liver is slightly echogenic suggesting fatty infiltration and/or hepatocellular disease. IVC: No abnormality visualized. Pancreas: Visualized portion unremarkable. Spleen: Size and appearance within normal limits. Right Kidney: Length: 11.7 cm. Right kidney is mildly echogenic suggesting the possibility of chronic medical renal disease. No mass or hydronephrosis visualized. Left Kidney: Length: 11.3 cm. Left kidney is mildly echogenic suggesting the possibility of chronic medical renal disease. No mass or hydronephrosis visualized. Abdominal  aorta: No aneurysm visualized. Other findings: None. IMPRESSION: 1. No acute abnormality.  No gallstones or biliary distention . 2. Liver is slightly echogenic suggesting fatty infiltration and/or hepatocellular disease. 3. Kidneys are slightly echogenic, chronic medical renal disease cannot be excluded. No focal renal abnormality. No hydronephrosis. Electronically Signed   By: Marcello Moores  Register   On: 11/19/2014 08:55    Prior Endoscopies:   See history of present illness  Physical Exam: BP 106/68 mmHg  Pulse 70  Ht 5\' 11"  (1.803 m)  Wt 201 lb 9.6 oz (91.445 kg)  BMI 28.13 kg/m2 Constitutional: Pleasant,well-developed, Caucasian male male in no acute distress. HEENT: Normocephalic and atraumatic. Conjunctivae are normal. No scleral icterus. Neck supple. No JVD Cardiovascular: Normal rate, regular rhythm.  Pulmonary/chest: Effort normal and breath sounds normal. No wheezing, rales or rhonchi. Abdominal: Soft, nondistended, tender to palpation in epigastric area with no rebound or guarding. Bowel sounds active throughout. There are no masses palpable. No hepatomegaly. Rectal:  Brown stool, Hemoccult positive. No hemorrhoids noted on exam Extremities: no edema Lymphadenopathy: No cervical adenopathy noted. Neurological: Alert and oriented to person place and time. Skin: Skin is warm and dry. No rashes noted. Psychiatric: Normal mood and affect. Behavior is normal.  ASSESSMENT AND PLAN: 81 year old male with a 2 month history of burning epigastric and. Umbilical pain, referred for evaluation. Patient has a history of duodenitis. He has not been using omeprazole regularly. He does admit to intermittent use of Aleve. He is noted to have heme positive stools today. He has been advised to undergo an EGD to evaluate for gastritis, ulcer, duodenitis, etc.The risks, benefits, and alternatives to endoscopy with possible biopsy and possible dilation were discussed with the patient and they consent to  proceed.  The procedure will be scheduled with Dr. Hilarie Fredrickson. A CBC, metabolic panel, hepatic function panel and lipase will be obtained. Patient has been advised to use his omeprazole 20 mg by mouth daily 30 minutes prior to breakfast on a regular basis. Further recommendations will be made pending the findings of the above.    Matthew [name], Matthew [name] 12/10/2014, 11:36 AM  CC: [name], MD  Addendum: Reviewed and agree with initial management. [name], MD

## 2014-12-10 NOTE — Patient Instructions (Signed)
You have been scheduled for an endoscopy. Please follow written instructions given to you at your visit today. If you use inhalers (even only as needed), please bring them with you on the day of your procedure. Your physician has requested that you go to www.startemmi.com and enter the access code given to you at your visit today. This web site gives a general overview about your procedure. However, you should still follow specific instructions given to you by our office regarding your preparation for the procedure.  Your physician has requested that you go to the basement for the lab work before leaving today.  Continue Omeprazoel 20 mg One by mouth daily.

## 2014-12-11 ENCOUNTER — Other Ambulatory Visit: Payer: Self-pay | Admitting: Internal Medicine

## 2014-12-11 ENCOUNTER — Encounter: Payer: Self-pay | Admitting: Internal Medicine

## 2014-12-11 ENCOUNTER — Ambulatory Visit (AMBULATORY_SURGERY_CENTER): Payer: Medicare Other | Admitting: Internal Medicine

## 2014-12-11 VITALS — BP 139/83 | HR 51 | Temp 96.3°F | Resp 17 | Ht 71.0 in | Wt 201.0 lb

## 2014-12-11 DIAGNOSIS — R1033 Periumbilical pain: Secondary | ICD-10-CM

## 2014-12-11 DIAGNOSIS — R1013 Epigastric pain: Secondary | ICD-10-CM | POA: Diagnosis present

## 2014-12-11 DIAGNOSIS — Q394 Esophageal web: Secondary | ICD-10-CM

## 2014-12-11 DIAGNOSIS — K2950 Unspecified chronic gastritis without bleeding: Secondary | ICD-10-CM | POA: Diagnosis not present

## 2014-12-11 MED ORDER — OMEPRAZOLE 40 MG PO CPDR: 40.0000 mg | DELAYED_RELEASE_CAPSULE | Freq: Every day | ORAL | Status: DC

## 2014-12-11 MED ORDER — SODIUM CHLORIDE 0.9 % IV SOLN: 500.0000 mL | INTRAVENOUS | Status: DC

## 2014-12-11 NOTE — Op Note (Signed)
[facility]  [address] [place], [address]   ENDOSCOPY PROCEDURE REPORT  PATIENT: Matthew [name], Matthew [name]  MR#: [id number] BIRTHDATE: [birth year] , 84  yrs. old GENDER: male ENDOSCOPIST: [name], MD REFERRED BY:  Unice Cobble, M.D. PROCEDURE DATE:  12/11/2014 PROCEDURE:  EGD, diagnostic and EGD w/ biopsy ASA CLASS:     Class III INDICATIONS:  epigastric pain and periumbilical abdominal pain. MEDICATIONS: Monitored anesthesia care and Propofol 100 mg IV TOPICAL ANESTHETIC: none  DESCRIPTION OF PROCEDURE: After the risks benefits and alternatives of the procedure were thoroughly explained, informed consent was obtained.  The [id number] endoscope was introduced through the mouth and advanced to the second portion of the duodenum , Without limitations.  The instrument was slowly withdrawn as the mucosa was fully examined.  ESOPHAGUS: The mucosa of the esophagus appeared normal.   A partial, non-obstructing Schatzki's ring was found at the gastroesophageal junction.  STOMACH: The mucosa of the stomach appeared normal.  Cold forcep biopsies were taken at the gastric body, antrum and angularis to evaluate for h.  pylori.  DUODENUM: The duodenal mucosa showed no abnormalities in the bulb and 2nd part of the duodenum. Retroflexed views revealed a small hiatal hernia.     The scope was then withdrawn from the patient and the procedure completed.  COMPLICATIONS: There were no immediate complications.  ENDOSCOPIC IMPRESSION: 1.   The mucosa of the esophagus appeared normal 2.   Partial and non-obstructing Schatzki's ring was found at the gastroesophageal junction 3.   The mucosa of the stomach appeared normal; multiple biopsies 4.   The duodenal mucosa showed no abnormalities in the bulb and 2nd part of the duodenum  RECOMMENDATIONS: 1.  Await biopsy results 2.  Continue current medications 3.  Given unexplained abdominal pain would recommend CT  scan abd/pelvis with contrast (unrevealing EGD, abdominal US and recent lab work)  eSigned:  [name], MD 12/11/2014 10:39 AM [id number] Chanda Busing, MD and The Patient

## 2014-12-11 NOTE — Patient Instructions (Signed)
Discharge instructions given. Contrast given to patient in recovery for CT SCAN. Biopsies taken. Increased omeprazole to 40mg  daily. Resume previous medications. YOU HAD AN ENDOSCOPIC PROCEDURE TODAY AT [facility]:   Refer to the procedure report that was given to you for any specific questions about what was found during the examination.  If the procedure report does not answer your questions, please call your gastroenterologist to clarify.  If you requested that your care partner not be given the details of your procedure findings, then the procedure report has been included in a sealed envelope for you to review at your convenience later.  YOU SHOULD EXPECT: Some feelings of bloating in the abdomen. Passage of more gas than usual.  Walking can help get rid of the air that was put into your GI tract during the procedure and reduce the bloating. If you had a lower endoscopy (such as a colonoscopy or flexible sigmoidoscopy) you may notice spotting of blood in your stool or on the toilet paper. If you underwent a bowel prep for your procedure, you may not have a normal bowel movement for a few days.  Please Note:  You might notice some irritation and congestion in your nose or some drainage.  This is from the oxygen used during your procedure.  There is no need for concern and it should clear up in a day or so.  SYMPTOMS TO REPORT IMMEDIATELY:    Following upper endoscopy (EGD)  Vomiting of blood or coffee ground material  New chest pain or pain under the shoulder blades  Painful or persistently difficult swallowing  New shortness of breath  Fever of 100F or higher  Black, tarry-looking stools  For urgent or emergent issues, a gastroenterologist can be reached at any hour by calling [phone].   DIET: Your first meal following the procedure should be a small meal and then it is ok to progress to your normal diet. Heavy or fried foods are harder to digest and may make  you feel nauseous or bloated.  Likewise, meals heavy in dairy and vegetables can increase bloating.  Drink plenty of fluids but you should avoid alcoholic beverages for 24 hours.  ACTIVITY:  You should plan to take it easy for the rest of today and you should NOT DRIVE or use heavy machinery until tomorrow (because of the sedation medicines used during the test).    FOLLOW UP: Our staff will call the number listed on your records the next business day following your procedure to check on you and address any questions or concerns that you may have regarding the information given to you following your procedure. If we do not reach you, we will leave a message.  However, if you are feeling well and you are not experiencing any problems, there is no need to return our call.  We will assume that you have returned to your regular daily activities without incident.  If any biopsies were taken you will be contacted by phone or by letter within the next 1-3 weeks.  Please call us at [phone] if you have not heard about the biopsies in 3 weeks.    SIGNATURES/CONFIDENTIALITY: You and/or your care partner have signed paperwork which will be entered into your electronic medical record.  These signatures attest to the fact that that the information above on your After Visit Summary has been reviewed and is understood.  Full responsibility of the confidentiality of this discharge information lies with you and/or  your care-partner. 

## 2014-12-11 NOTE — Progress Notes (Signed)
Patient was able to answer all questions except his medications.  He has the beginnings of dementia.  His wife answered the medications for me.

## 2014-12-11 NOTE — Progress Notes (Signed)
A/ox3 pleased with MAC, report to Annette RN 

## 2014-12-11 NOTE — Progress Notes (Signed)
Dental advisory given to patient 

## 2014-12-11 NOTE — Progress Notes (Signed)
Called to room to assist during endoscopic procedure.  Patient ID and intended procedure confirmed with present staff. Received instructions for my participation in the procedure from the performing physician.  

## 2014-12-12 ENCOUNTER — Telehealth: Payer: Self-pay

## 2014-12-12 ENCOUNTER — Telehealth: Payer: Self-pay | Admitting: *Deleted

## 2014-12-12 ENCOUNTER — Other Ambulatory Visit: Payer: Self-pay

## 2014-12-12 DIAGNOSIS — R1084 Generalized abdominal pain: Secondary | ICD-10-CM

## 2014-12-12 NOTE — Telephone Encounter (Signed)
  Follow up Call-  Call back number 12/11/2014  Post procedure Call Back phone  # [phone]  Permission to leave phone message Yes     Patient questions:  Do you have a fever, pain , or abdominal swelling? No. Pain Score  0 *  Have you tolerated food without any problems? Yes.    Have you been able to return to your normal activities? Yes.    Do you have any questions about your discharge instructions: Diet   No. Medications  No. Follow up visit  No.  Do you have questions or concerns about your Care? No.  Actions: * If pain score is 4 or above: No action needed, pain <4.

## 2014-12-12 NOTE — Telephone Encounter (Signed)
Pt scheduled for CT of A/P at [facility] 12/19/14@2 :30pm. Pt to arrive there at 2:15pm. Pt to be NPO after 10:30am, drink bottle 1 at 12:30pm and bottle 2 at 1:30pm. Pt aware of appt.

## 2014-12-17 ENCOUNTER — Encounter: Payer: Self-pay | Admitting: Internal Medicine

## 2014-12-19 ENCOUNTER — Ambulatory Visit (HOSPITAL_COMMUNITY)
Admission: RE | Admit: 2014-12-19 | Discharge: 2014-12-19 | Disposition: A | Discharge status: Home / Self Care | Payer: Medicare Other | Source: Ambulatory Visit | Attending: Internal Medicine | Admitting: Internal Medicine

## 2014-12-19 DIAGNOSIS — K869 Disease of pancreas, unspecified: Secondary | ICD-10-CM | POA: Diagnosis not present

## 2014-12-19 DIAGNOSIS — Z8546 Personal history of malignant neoplasm of prostate: Secondary | ICD-10-CM | POA: Insufficient documentation

## 2014-12-19 DIAGNOSIS — R1084 Generalized abdominal pain: Secondary | ICD-10-CM | POA: Insufficient documentation

## 2014-12-19 DIAGNOSIS — I700 Atherosclerosis of aorta: Secondary | ICD-10-CM | POA: Insufficient documentation

## 2014-12-19 MED ORDER — IOHEXOL 300 MG/ML  SOLN
100.0000 mL | Freq: Once | INTRAMUSCULAR | Status: AC | PRN
  Administered 2014-12-19: 100 mL via INTRAVENOUS

## 2014-12-24 ENCOUNTER — Other Ambulatory Visit: Payer: Self-pay

## 2014-12-24 DIAGNOSIS — K862 Cyst of pancreas: Secondary | ICD-10-CM

## 2014-12-25 ENCOUNTER — Encounter: Payer: Self-pay | Admitting: Internal Medicine

## 2014-12-25 ENCOUNTER — Ambulatory Visit (INDEPENDENT_AMBULATORY_CARE_PROVIDER_SITE_OTHER): Payer: Medicare Other | Admitting: Internal Medicine

## 2014-12-25 VITALS — BP 128/80 | HR 50 | Temp 97.6°F | Resp 16 | Wt 199.0 lb

## 2014-12-25 DIAGNOSIS — G609 Hereditary and idiopathic neuropathy, unspecified: Secondary | ICD-10-CM

## 2014-12-25 NOTE — Patient Instructions (Signed)
Start taking the Lyrica 50 mg twice a day, not just at night.  After one week if you want to try increasing this to 75 mg twice a day call and we will increase it over the phone.  Monitor closely for side effects.

## 2014-12-25 NOTE — Assessment & Plan Note (Signed)
Has been taking 100 mg Lyrica at night -- change to 50 mg twice daily as prescribed Next week if they want to try 75 mg twice daily I have asked them to call and we will increase it over the phone.

## 2014-12-25 NOTE — Progress Notes (Signed)
Subjective:    Patient ID: Matthew [name], male    DOB: [birth year], 81 y.o.   MRN: [id number]  HPI He is here for follow up of his peripheral neuropathy.  Last month we increased the Lyrica from 25 mg twice daily to 50 mg daily.  Him and his wife deny side effects.  His wife has been giving it to him at night only  but giving him both pills at night.  He only typically complains at night, but does have pain during the day as well.  He has some dementia and is a very poor historian.  They are unsure if increasing the dose would help.   Sick feeling in stomach.  He did have that feeling last night, but it originally started in mid august.  His aricept dose was increased 8/1.  So far his GI work up has been negative for a cause. 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  He sees his neurologist next month.    Medications and allergies reviewed with patient and updated if appropriate.  Patient Active Problem List   Diagnosis Date Noted  . Hereditary and idiopathic peripheral neuropathy 11/07/2014  . Elevated serum creatinine 02/04/2013  . Abnormal EKG 02/16/2012  . Moderate dementia without behavioral disturbance 11/02/2009  . SKIN CANCER, HX OF 11/02/2009  . ANXIETY 02/03/2007  . HEMORRHOIDS, INTERNAL 02/03/2007  . ARTHRITIS 02/03/2007  . SLEEP APNEA 02/03/2007  . Hyperlipidemia 11/13/2006  . METABOLIC SYNDROME X 99991111  . PVD 11/13/2006  . PROSTATE CANCER, HX OF 11/13/2006  . KIDNEY DISEASE, CHRONIC NOS 03/27/2006  . 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 OSTEOARTHROSIS NOS, UNSPECIFIED SITE 03/27/2006  . COLONIC POLYPS, HYPERPLASTIC 12/24/2003  . HIATAL HERNIA 12/24/2003  . Diverticulosis of large intestine 12/24/2003    Current Outpatient Prescriptions on File Prior to Visit  Medication Sig Dispense Refill  . aspirin EC 81 MG tablet Take 81 mg by mouth daily.    [name] donepezil (ARICEPT) 10 MG tablet Take 1 tablet (10 mg  total) by mouth at bedtime. 30 tablet 12  . omeprazole (PRILOSEC) 40 MG capsule Take 1 capsule (40 mg total) by mouth daily. 90 capsule 3  . polyethylene glycol (MIRALAX / GLYCOLAX) packet Take 17 g by mouth every evening.     . pregabalin (LYRICA) 50 MG capsule Take 1 capsule (50 mg total) by mouth 3 (three) times daily. 60 capsule 5  . simvastatin (ZOCOR) 40 MG tablet TAKE 1 BY MOUTH AT BEDTIME 90 tablet 3  . traMADol (ULTRAM) 50 MG tablet Take 1 tablet (50 mg total) by mouth 2 (two) times daily. 60 tablet 0   No current facility-administered medications on file prior to visit.    Past Medical History  Diagnosis Date  . Arthritis   . Anxiety   . Hypertension   . Hyperlipidemia   . Chronic kidney disease   . Prostate cancer [facility])     Dr [name]  . Cataract     NOS  . Impotence of organic origin   . PVD (peripheral vascular disease) ([place])   . Metabolic syndrome X   . Dysphagia     PMH of  . Diverticulosis of colon   . Hemorrhoids, internal   . Hiatal hernia   . Skin cancer   . History of colon polyps 2005    hyperplastic; Dr [name]    Past Surgical History  Procedure Laterality  Date  . Appendectomy  1950  . Cervical fusion    . Colonoscopy w/ polypectomy  2005    Dr [name]  . Prostatectomy  1995    Dr.[name]; no chemo or radiation  . Tonsillectomy    . Shoulder surgery      right  . Left heart catheterization with coronary angiogram N/A 03/02/2012    Procedure: LEFT HEART CATHETERIZATION WITH CORONARY ANGIOGRAM;  Surgeon: [name], MD;  Location: [facility] CATH LAB;  Service: Cardiovascular;  Laterality: N/A;    Social History   Social History  . Marital Status: Married    Spouse Name: N/A  . Number of Children: 4  . Years of Education: 12   Occupational History  . Retired     [employer]   Social History Main Topics  . Smoking status: Never Smoker   . Smokeless tobacco: Never Used  . Alcohol Use: No  . Drug Use: No  . Sexual  Activity: Not on file   Other Topics Concern  . Not on file   Social History Narrative   Lives at home with wife.      Review of Systems  Gastrointestinal: Positive for nausea and abdominal pain. Negative for vomiting.  Neurological: Positive for numbness. Negative for weakness, light-headedness and headaches.       Objective:   Filed Vitals:   12/25/14 0943  BP: 128/80  Pulse: 50  Temp: 97.6 F (36.4 C)  Resp: 16   Filed Weights   12/25/14 0943  Weight: 199 lb (90.266 kg)   Body mass index is 27.77 kg/(m^2).   Physical Exam  Constitutional: He appears well-developed and well-nourished. No distress.  Cardiovascular: Normal rate and regular rhythm.   Pulmonary/Chest: Effort normal and breath sounds normal. No respiratory distress. He has no wheezes. He has no rales.  Abdominal: Soft. He exhibits no distension. There is no tenderness.  Musculoskeletal: He exhibits no edema or tenderness.  Skin: He is not diaphoretic.  Psychiatric: He has a normal mood and affect. His behavior is normal.        Assessment & Plan:   Stomach upset - "sickish" feeling Hard to get a better explanation for him if it is pain or nausea GI is evaluating his pancreatic cyst - to have an MRI next week Possibly related to increasing the dose of aricept Advised his wife to call neuro and discuss with them - they may wait until their appt  See Problem List.

## 2014-12-25 NOTE — Progress Notes (Signed)
Pre visit review using our clinic review tool, if applicable. No additional management support is needed unless otherwise documented below in the visit note. 

## 2015-01-02 ENCOUNTER — Other Ambulatory Visit: Payer: Self-pay | Admitting: Internal Medicine

## 2015-01-02 ENCOUNTER — Ambulatory Visit (HOSPITAL_COMMUNITY)
Admission: RE | Admit: 2015-01-02 | Discharge: 2015-01-02 | Disposition: A | Discharge status: Home / Self Care | Payer: Medicare Other | Source: Ambulatory Visit | Attending: Internal Medicine | Admitting: Internal Medicine

## 2015-01-02 DIAGNOSIS — K862 Cyst of pancreas: Secondary | ICD-10-CM

## 2015-01-07 ENCOUNTER — Telehealth: Payer: Self-pay | Admitting: Internal Medicine

## 2015-01-07 NOTE — Telephone Encounter (Signed)
Patient was not able to complete MRI.  Unable to follow instructions with holding his breath and the study was abandoned due to sub par images.  Please advise next step

## 2015-01-07 NOTE — Telephone Encounter (Signed)
I had previously discussed a subtle pancreatic lesion seen by CT with Dr. Ardis Hughs. MRI was recommended Given his epigastric symptoms, I would like to see if Dr. Ardis Hughs feels that EUS would be appropriate given inability to complete MRI Dan, do recommend EUS or repeat CT in 6-12 months?

## 2015-01-08 ENCOUNTER — Other Ambulatory Visit: Payer: Self-pay

## 2015-01-08 DIAGNOSIS — K862 Cyst of pancreas: Secondary | ICD-10-CM

## 2015-01-08 DIAGNOSIS — R109 Unspecified abdominal pain: Secondary | ICD-10-CM

## 2015-01-08 NOTE — Telephone Encounter (Signed)
EUS scheduled, pt instructed and medications reviewed.  Patient instructions mailed to home.  Patient to call with any questions or concerns.  

## 2015-01-08 NOTE — Telephone Encounter (Signed)
Pt has been scheduled for 01/15/15 1230 pm instructions have been mailed to the pt. Left message on machine to call back  Unable to reach [facility] endo due to phone issues.

## 2015-01-08 NOTE — Telephone Encounter (Signed)
I think EUS is probably best next step since MRI not possible.  I'll alert patty about this as well.   Patty, He needs upper EUS, radial +/- linear, next avail EUS Thursday for pancreatic cyst, abd pain.  thanks

## 2015-01-08 NOTE — Telephone Encounter (Signed)
Matthew [name] has been notified and the case is scheduled need to instruct the pt.

## 2015-01-08 NOTE — Telephone Encounter (Signed)
Thanks Molinda Bailiff, please let pt know given inability to tolerate MRI, internal ultrasound will be scheduled with Dr. Ardis Hughs

## 2015-01-09 ENCOUNTER — Encounter (HOSPITAL_COMMUNITY): Payer: Self-pay | Admitting: *Deleted

## 2015-01-15 ENCOUNTER — Ambulatory Visit (HOSPITAL_COMMUNITY)
Admission: RE | Admit: 2015-01-15 | Discharge: 2015-01-15 | Disposition: A | Discharge status: Home / Self Care | Payer: Medicare Other | Source: Ambulatory Visit | Attending: Gastroenterology | Admitting: Gastroenterology

## 2015-01-15 ENCOUNTER — Ambulatory Visit (HOSPITAL_COMMUNITY): Payer: Medicare Other | Admitting: Anesthesiology

## 2015-01-15 ENCOUNTER — Ambulatory Visit: Payer: Medicare Other | Admitting: Diagnostic Neuroimaging

## 2015-01-15 ENCOUNTER — Encounter (HOSPITAL_COMMUNITY): Payer: Self-pay

## 2015-01-15 ENCOUNTER — Encounter (HOSPITAL_COMMUNITY)
Admission: RE | Disposition: A | Discharge status: Home / Self Care | Payer: Self-pay | Source: Ambulatory Visit | Attending: Gastroenterology

## 2015-01-15 DIAGNOSIS — F0390 Unspecified dementia without behavioral disturbance: Secondary | ICD-10-CM | POA: Insufficient documentation

## 2015-01-15 DIAGNOSIS — Z7982 Long term (current) use of aspirin: Secondary | ICD-10-CM | POA: Insufficient documentation

## 2015-01-15 DIAGNOSIS — K449 Diaphragmatic hernia without obstruction or gangrene: Secondary | ICD-10-CM | POA: Insufficient documentation

## 2015-01-15 DIAGNOSIS — M1990 Unspecified osteoarthritis, unspecified site: Secondary | ICD-10-CM | POA: Insufficient documentation

## 2015-01-15 DIAGNOSIS — R110 Nausea: Secondary | ICD-10-CM | POA: Insufficient documentation

## 2015-01-15 DIAGNOSIS — I739 Peripheral vascular disease, unspecified: Secondary | ICD-10-CM | POA: Diagnosis not present

## 2015-01-15 DIAGNOSIS — N189 Chronic kidney disease, unspecified: Secondary | ICD-10-CM | POA: Insufficient documentation

## 2015-01-15 DIAGNOSIS — I129 Hypertensive chronic kidney disease with stage 1 through stage 4 chronic kidney disease, or unspecified chronic kidney disease: Secondary | ICD-10-CM | POA: Insufficient documentation

## 2015-01-15 DIAGNOSIS — R109 Unspecified abdominal pain: Secondary | ICD-10-CM | POA: Insufficient documentation

## 2015-01-15 DIAGNOSIS — G609 Hereditary and idiopathic neuropathy, unspecified: Secondary | ICD-10-CM | POA: Insufficient documentation

## 2015-01-15 DIAGNOSIS — E785 Hyperlipidemia, unspecified: Secondary | ICD-10-CM | POA: Diagnosis not present

## 2015-01-15 DIAGNOSIS — G4730 Sleep apnea, unspecified: Secondary | ICD-10-CM | POA: Diagnosis not present

## 2015-01-15 DIAGNOSIS — Z79899 Other long term (current) drug therapy: Secondary | ICD-10-CM | POA: Diagnosis not present

## 2015-01-15 DIAGNOSIS — K862 Cyst of pancreas: Secondary | ICD-10-CM | POA: Diagnosis not present

## 2015-01-15 HISTORY — PX: EUS: SHX5427

## 2015-01-15 LAB — PANC CYST FLD ANLYS-PATHFNDR-TG

## 2015-01-15 SURGERY — UPPER ENDOSCOPIC ULTRASOUND (EUS) RADIAL
Anesthesia: Monitor Anesthesia Care

## 2015-01-15 MED ORDER — CIPROFLOXACIN IN D5W 400 MG/200ML IV SOLN
INTRAVENOUS | Status: AC
  Filled 2015-01-15: qty 200

## 2015-01-15 MED ORDER — LIDOCAINE HCL (PF) 2 % IJ SOLN
INTRAMUSCULAR | Status: DC | PRN
  Administered 2015-01-15: 100 mg via INTRADERMAL

## 2015-01-15 MED ORDER — LIDOCAINE HCL (CARDIAC) 20 MG/ML IV SOLN
INTRAVENOUS | Status: AC
  Filled 2015-01-15: qty 5

## 2015-01-15 MED ORDER — CIPROFLOXACIN IN D5W 400 MG/200ML IV SOLN
INTRAVENOUS | Status: DC | PRN
  Administered 2015-01-15: 400 mg via INTRAVENOUS

## 2015-01-15 MED ORDER — CIPROFLOXACIN HCL 500 MG PO TABS: 500.0000 mg | ORAL_TABLET | Freq: Two times a day (BID) | ORAL | Status: DC

## 2015-01-15 MED ORDER — PROPOFOL 500 MG/50ML IV EMUL
INTRAVENOUS | Status: DC | PRN
  Administered 2015-01-15: 75 ug/kg/min via INTRAVENOUS

## 2015-01-15 MED ORDER — SODIUM CHLORIDE 0.9 % IV SOLN: INTRAVENOUS | Status: DC

## 2015-01-15 MED ORDER — PROPOFOL 10 MG/ML IV BOLUS
INTRAVENOUS | Status: AC
  Filled 2015-01-15: qty 40

## 2015-01-15 MED ORDER — LACTATED RINGERS IV SOLN
INTRAVENOUS | Status: DC
  Administered 2015-01-15: 10:00:00 via INTRAVENOUS

## 2015-01-15 MED ORDER — PROPOFOL 10 MG/ML IV BOLUS
INTRAVENOUS | Status: DC | PRN
  Administered 2015-01-15: 20 mg via INTRAVENOUS
  Administered 2015-01-15: 30 mg via INTRAVENOUS
  Administered 2015-01-15 (×2): 20 mg via INTRAVENOUS

## 2015-01-15 MED ORDER — PROPOFOL 10 MG/ML IV BOLUS
INTRAVENOUS | Status: AC
  Filled 2015-01-15: qty 20

## 2015-01-15 NOTE — Transfer of Care (Signed)
Immediate Anesthesia Transfer of Care Note  Patient: Matthew [name]  Procedure(s) Performed: Procedure(s): UPPER ENDOSCOPIC ULTRASOUND (EUS) RADIAL (N/A)  Patient Location: PACU  Anesthesia Type:MAC  Level of Consciousness:  sedated, patient cooperative and responds to stimulation  Airway & Oxygen Therapy:Patient Spontanous Breathing   Post-op Assessment:  Report given to PACU RN and Post -op Vital signs reviewed and stable  Post vital signs:  Reviewed and stable  Last Vitals:  Filed Vitals:   01/15/15 1016  BP: 179/66  Pulse: 60  Temp: 36.7 C  Resp: 18    Complications: No apparent anesthesia complications

## 2015-01-15 NOTE — Anesthesia Postprocedure Evaluation (Signed)
Anesthesia Post Note  Patient: Matthew [name]  Procedure(s) Performed: Procedure(s) (LRB): UPPER ENDOSCOPIC ULTRASOUND (EUS) RADIAL (N/A)  Patient location during evaluation: PACU Anesthesia Type: MAC Level of consciousness: awake and alert Pain management: pain level controlled Vital Signs Assessment: post-procedure vital signs reviewed and stable Respiratory status: spontaneous breathing, nonlabored ventilation, respiratory function stable and patient connected to nasal cannula oxygen Cardiovascular status: stable and blood pressure returned to baseline Anesthetic complications: no    Last Vitals:  Filed Vitals:   01/15/15 1240 01/15/15 1250  BP: 183/87 174/91  Pulse: 49 48  Temp:    Resp: 16 12    Last Pain: There were no vitals filed for this visit.               Zenaida Deed

## 2015-01-15 NOTE — Discharge Instructions (Signed)

## 2015-01-15 NOTE — Op Note (Signed)
[facility] [place], [address]   ENDOSCOPIC ULTRASOUND PROCEDURE REPORT  PATIENT: Matthew [name], Matthew [name]  MR#: [id number] BIRTHDATE: 25-May-1930  GENDER: male ENDOSCOPIST: [name], MD REFERRED BY:  Zenovia Jarred, MD PROCEDURE DATE:  01/15/2015 PROCEDURE:   Upper EUS w/FNA ASA CLASS:      Class III INDICATIONS:   1.  abdominal pain, nausea, uncinate pancreas cyst; no previous pancreatic disease, never etoh abuser, non-smoker, no FH of pancreatic cancer. MEDICATIONS: Monitored anesthesia care and cipro 400mg  IV  DESCRIPTION OF PROCEDURE:   After the risks benefits and alternatives of the procedure were  explained, informed consent was obtained. The patient was then placed in the left, lateral, decubitus postion and IV sedation was administered. Throughout the procedure, the patients blood pressure, pulse and oxygen saturations were monitored continuously.  Under direct visualization, the Pentax Radial EUS [id number]  endoscope was introduced through the mouth  and advanced to the second portion of the duodenum .  Water was used as necessary to provide an acoustic interface.  Upon completion of the imaging, water was removed and the patient was sent to the recovery room in satisfactory condition.  Endoscopic findings (radial and linear echoendoscopes): 1. Mild gastritis, otherwise normal UGI examination  EUS findings: 1. 2.2cm anechoic (cystic) mass in uncinate pancreas. There are no associated solid nodules, masses and the lesion does not appear to communicate with the main pancreatic duct.  The cyst was completely aspirated with a single transduodenal pass with a 22 gauge EUS FNA needle, suction. Aspiration yielded 4cc of thin, clear fluid that was sent for cytology, CEA and amylase. 2. Pancreatic parenchyma was otherwise normal throughout the gland. 3. Main pancreatic duct was normal, non-dilated. 4. CBD was normal, non-dilated. 5. Limited views of  liver, spleen, portal and splenic vessels were all normal.  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 The cyst was completely aspirated, fluid was sent for cytology, CEA and amylase.  Given it's size, morphology it is unlikely that this cyst is causing his UGI symptoms.  Await final fluid testing results. He will complete 3 days of twice daily cipro.   _______________________________ eSigned:  [name], MD 01/15/2015 11:51 AM

## 2015-01-15 NOTE — Interval H&P Note (Signed)
History and Physical Interval Note:  01/15/2015 10:39 AM  Matthew [name]  has presented today for surgery, with the diagnosis of pancratic cyst, abd pain  The various methods of treatment have been discussed with the patient and family. After consideration of risks, benefits and other options for treatment, the patient has consented to  Procedure(s): UPPER ENDOSCOPIC ULTRASOUND (EUS) RADIAL (N/A) as a surgical intervention .  The patient's history has been reviewed, patient examined, no change in status, stable for surgery.  I have reviewed the patient's chart and labs.  Questions were answered to the patient's satisfaction.     [name]

## 2015-01-15 NOTE — Anesthesia Preprocedure Evaluation (Addendum)
Anesthesia Evaluation  Patient identified by MRN, date of birth, ID band Patient awake    Reviewed: Allergy & Precautions, NPO status , Patient's Chart, lab work & pertinent test results  Airway Mallampati: II  TM Distance: >3 FB Neck ROM: Full    Dental no notable dental hx.    Pulmonary    Pulmonary exam normal breath sounds clear to auscultation       Cardiovascular hypertension, Pt. on medications + Peripheral Vascular Disease   Rhythm:Regular Rate:Normal  Echo 2007- Overall left ventricular systolic function was normal. There were    no left ventricular regional wall motion abnormalities. - The aortic valve was mildly to moderately calcified. - The left atrium was mildly dilated.   Neuro/Psych PSYCHIATRIC DISORDERS Anxiety  Neuromuscular disease    GI/Hepatic hiatal hernia,   Endo/Other    Renal/GU Renal disease     Musculoskeletal  (+) Arthritis ,   Abdominal   Peds  Hematology   Anesthesia Other Findings dysphagia  Reproductive/Obstetrics                            Anesthesia Physical Anesthesia Plan  ASA: III  Anesthesia Plan: MAC   Post-op Pain Management:    Induction: Intravenous  Airway Management Planned: Nasal Cannula  Additional Equipment: None  Intra-op Plan:   Post-operative Plan:   Informed Consent: I have reviewed the patients History and Physical, chart, labs and discussed the procedure including the risks, benefits and alternatives for the proposed anesthesia with the patient or authorized representative who has indicated his/her understanding and acceptance.     Plan Discussed with: Anesthesiologist and CRNA  Anesthesia Plan Comments:         Anesthesia Quick Evaluation

## 2015-01-15 NOTE — H&P (View-Only) (Signed)
Subjective:    Patient ID: Matthew [name], male    DOB: Oct 18, 1930, 82 y.o.   MRN: [id number]  HPI He is here for follow up of his peripheral neuropathy.  Last month we increased the Lyrica from 25 mg twice daily to 50 mg daily.  Him and his wife deny side effects.  His wife has been giving it to him at night only  but giving him both pills at night.  He only typically complains at night, but does have pain during the day as well.  He has some dementia and is a very poor historian.  They are unsure if increasing the dose would help.   Sick feeling in stomach.  He did have that feeling last night, but it originally started in mid august.  His aricept dose was increased 8/1.  So far his GI work up has been negative for a cause. 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  He sees his neurologist next month.    Medications and allergies reviewed with patient and updated if appropriate.  Patient Active Problem List   Diagnosis Date Noted  . Hereditary and idiopathic peripheral neuropathy 11/07/2014  . Elevated serum creatinine 02/04/2013  . Abnormal EKG 02/16/2012  . Moderate dementia without behavioral disturbance 11/02/2009  . SKIN CANCER, HX OF 11/02/2009  . ANXIETY 02/03/2007  . HEMORRHOIDS, INTERNAL 02/03/2007  . ARTHRITIS 02/03/2007  . SLEEP APNEA 02/03/2007  . Hyperlipidemia 11/13/2006  . METABOLIC SYNDROME X [id number]  . PVD 11/13/2006  . PROSTATE CANCER, HX OF 11/13/2006  . KIDNEY DISEASE, CHRONIC NOS 03/27/2006  . 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 OSTEOARTHROSIS NOS, UNSPECIFIED SITE 03/27/2006  . COLONIC POLYPS, HYPERPLASTIC 12/24/2003  . HIATAL HERNIA 12/24/2003  . Diverticulosis of large intestine 12/24/2003    Current Outpatient Prescriptions on File Prior to Visit  Medication Sig Dispense Refill  . aspirin EC 81 MG tablet Take 81 mg by mouth daily.    [name] donepezil (ARICEPT) 10 MG tablet Take 1 tablet (10 mg  total) by mouth at bedtime. 30 tablet 12  . omeprazole (PRILOSEC) 40 MG capsule Take 1 capsule (40 mg total) by mouth daily. 90 capsule 3  . polyethylene glycol (MIRALAX / GLYCOLAX) packet Take 17 g by mouth every evening.     . pregabalin (LYRICA) 50 MG capsule Take 1 capsule (50 mg total) by mouth 3 (three) times daily. 60 capsule 5  . simvastatin (ZOCOR) 40 MG tablet TAKE 1 BY MOUTH AT BEDTIME 90 tablet 3  . traMADol (ULTRAM) 50 MG tablet Take 1 tablet (50 mg total) by mouth 2 (two) times daily. 60 tablet 0   No current facility-administered medications on file prior to visit.    Past Medical History  Diagnosis Date  . Arthritis   . Anxiety   . Hypertension   . Hyperlipidemia   . Chronic kidney disease   . Prostate cancer [facility])     Dr [name]  . Cataract     NOS  . Impotence of organic origin   . PVD (peripheral vascular disease) ([place])   . Metabolic syndrome X   . Dysphagia     PMH of  . Diverticulosis of colon   . Hemorrhoids, internal   . Hiatal hernia   . Skin cancer   . History of colon polyps 2005    hyperplastic; Dr [name]    Past Surgical History  Procedure Laterality  Date  . Appendectomy  1950  . Cervical fusion    . Colonoscopy w/ polypectomy  2005    Dr [name]  . Prostatectomy  1995    Dr.[name]; no chemo or radiation  . Tonsillectomy    . Shoulder surgery      right  . Left heart catheterization with coronary angiogram N/A 03/02/2012    Procedure: LEFT HEART CATHETERIZATION WITH CORONARY ANGIOGRAM;  Surgeon: [name], MD;  Location: [facility] CATH LAB;  Service: Cardiovascular;  Laterality: N/A;    Social History   Social History  . Marital Status: Married    Spouse Name: N/A  . Number of Children: 4  . Years of Education: 12   Occupational History  . Retired     [employer]   Social History Main Topics  . Smoking status: Never Smoker   . Smokeless tobacco: Never Used  . Alcohol Use: No  . Drug Use: No  . Sexual  Activity: Not on file   Other Topics Concern  . Not on file   Social History Narrative   Lives at home with wife.      Review of Systems  Gastrointestinal: Positive for nausea and abdominal pain. Negative for vomiting.  Neurological: Positive for numbness. Negative for weakness, light-headedness and headaches.       Objective:   Filed Vitals:   12/25/14 0943  BP: 128/80  Pulse: 50  Temp: 97.6 F (36.4 C)  Resp: 16   Filed Weights   12/25/14 0943  Weight: 199 lb (90.266 kg)   Body mass index is 27.77 kg/(m^2).   Physical Exam  Constitutional: He appears well-developed and well-nourished. No distress.  Cardiovascular: Normal rate and regular rhythm.   Pulmonary/Chest: Effort normal and breath sounds normal. No respiratory distress. He has no wheezes. He has no rales.  Abdominal: Soft. He exhibits no distension. There is no tenderness.  Musculoskeletal: He exhibits no edema or tenderness.  Skin: He is not diaphoretic.  Psychiatric: He has a normal mood and affect. His behavior is normal.        Assessment & Plan:   Stomach upset - "sickish" feeling Hard to get a better explanation for him if it is pain or nausea GI is evaluating his pancreatic cyst - to have an MRI next week Possibly related to increasing the dose of aricept Advised his wife to call neuro and discuss with them - they may wait until their appt  See Problem List.

## 2015-01-16 ENCOUNTER — Encounter (HOSPITAL_COMMUNITY): Payer: Self-pay | Admitting: Gastroenterology

## 2015-01-16 ENCOUNTER — Telehealth: Payer: Self-pay | Admitting: *Deleted

## 2015-01-16 MED ORDER — TRAMADOL HCL 50 MG PO TABS: 50.0000 mg | ORAL_TABLET | Freq: Two times a day (BID) | ORAL | Status: DC

## 2015-01-16 MED ORDER — PREGABALIN 50 MG PO CAPS: 50.0000 mg | ORAL_CAPSULE | Freq: Two times a day (BID) | ORAL | Status: DC

## 2015-01-16 NOTE — Telephone Encounter (Signed)
Notified pt wife rx [facility] to [facility].../lmb

## 2015-01-16 NOTE — Telephone Encounter (Signed)
printed

## 2015-01-16 NOTE — Telephone Encounter (Signed)
Receive call from wife pt is needing refill on his Lyrica & Tramadol sent to [facility].../lmb

## 2015-01-27 ENCOUNTER — Encounter: Payer: Self-pay | Admitting: *Deleted

## 2015-02-10 ENCOUNTER — Encounter: Payer: Self-pay | Admitting: Internal Medicine

## 2015-02-10 ENCOUNTER — Ambulatory Visit (INDEPENDENT_AMBULATORY_CARE_PROVIDER_SITE_OTHER): Payer: Medicare Other | Admitting: Internal Medicine

## 2015-02-10 VITALS — BP 108/70 | HR 59 | Ht 71.0 in | Wt 200.0 lb

## 2015-02-10 DIAGNOSIS — K219 Gastro-esophageal reflux disease without esophagitis: Secondary | ICD-10-CM

## 2015-02-10 DIAGNOSIS — R1013 Epigastric pain: Secondary | ICD-10-CM

## 2015-02-10 DIAGNOSIS — K862 Cyst of pancreas: Secondary | ICD-10-CM | POA: Diagnosis not present

## 2015-02-10 NOTE — Progress Notes (Signed)
Subjective:    Patient ID: Matthew [name], male    DOB: 09/05/1930, 82 y.o.   MRN: [id number]  HPI Marquett Ferrando is an 82 year old male with a past medical history of GERD, diverticulosis, epigastric and midabdominal pain who is here for follow-up. He also has a history of dementia, peripheral neuropathy, chronic kidney disease, hyperlipidemia and remote prostate cancer. He was evaluated by Arta Bruce, PA-C in November for persistent epigastric and periumbilical abdominal pain. Cross-sectional imaging was performed in December which showed no acute findings but did show a fluid attenuating structure in the uncinate process of the pancreas. Ultrasound performed one month earlier also showed no acute abnormality and possibly mild fatty liver disease. He had an upper endoscopy performed by me on 12/11/2014 which showed a partial nonobstructing Schatzki's ring, small hiatal hernia and was otherwise normal. Gastric biopsies were unrevealing. Given pain and pancreatic cystic lesion EUS was performed by Oretha Caprice, M.D. on 01/15/2015. This revealed a 2.2 cm cystic mass in the oxalate pancreas. This was aspirated. The pancreatic parenchymal is otherwise normal as was the pancreatic duct and CBD. The cyst aspirate showed low CEA and high amylase with no malignant cells. This was felt to be a benign cyst and no further testing necessary.  He returns today with his wife and states that he has been feeling better lately. He's had no issues with abdominal or periumbilical pain. His appetite has been good without nausea or vomiting. Bowel movements a been regular. He is periodically using MiraLAX for his history of constipation. He's continuing on Prilosec 40 mg daily and denies heartburn or trouble swallowing. He has follow-up with neurology for his dementia next week.  Review of Systems As per history of present illness, otherwise negative  Current Medications, Allergies, Past Medical History, Past Surgical  History, Family History and Social History were reviewed in [employer] record.     Objective:   Physical Exam BP 108/70 mmHg  Pulse 59  Ht 5\' 11"  (1.803 m)  Wt 200 lb (90.719 kg)  BMI 27.91 kg/m2 Constitutional: Well-developed and well-nourished. No distress. HEENT: Normocephalic and atraumatic. Oropharynx is clear and moist. No oropharyngeal exudate. Conjunctivae are normal.  No scleral icterus. Neck: Neck supple. Trachea midline. Cardiovascular: Loletha Grayer, regular rhythm and intact distal pulses. No M/R/G Pulmonary/chest: Effort normal and breath sounds normal. No wheezing, rales or rhonchi. Abdominal: Soft, nontender, nondistended. Bowel sounds active throughout. There are no masses palpable. No hepatosplenomegaly. Extremities: no clubbing, cyanosis, or edema Neurological: Alert and oriented to person place and time. Skin: Skin is warm and dry.[name] Psychiatric: Normal mood and affect. Behavior is normal.  CBC    Component Value Date/Time   WBC 7.2 12/10/2014 1143   RBC 4.76 12/10/2014 1143   HGB 14.1 12/10/2014 1143   HCT 42.7 12/10/2014 1143   PLT 163.0 12/10/2014 1143   MCV 89.7 12/10/2014 1143   MCHC 33.1 12/10/2014 1143   RDW 13.3 12/10/2014 1143   LYMPHSABS 2.2 12/10/2014 1143   MONOABS 0.7 12/10/2014 1143   EOSABS 0.1 12/10/2014 1143   BASOSABS 0.0 12/10/2014 1143    CMP     Component Value Date/Time   NA 141 12/10/2014 1143   K 4.5 12/10/2014 1143   CL 106 12/10/2014 1143   CO2 29 12/10/2014 1143   GLUCOSE 93 12/10/2014 1143   BUN 20 12/10/2014 1143   CREATININE 1.41 12/10/2014 1143   CALCIUM 9.2 12/10/2014 1143   PROT 6.6 12/10/2014 1143   ALBUMIN  4.0 12/10/2014 1143   AST 13 12/10/2014 1143   ALT 10 12/10/2014 1143   ALKPHOS 41 12/10/2014 1143   BILITOT 0.4 12/10/2014 1143   GFRNONAA 48.89 11/02/2009 0928   GFRAA 55 11/13/2006 0000   Lipase     Component Value Date/Time   LIPASE 26.0 12/10/2014 1143   CT abd and [place] abd  reviewed today    Assessment & Plan:  82 year old male with a past medical history of GERD, diverticulosis, epigastric and midabdominal pain who is here for follow-up. He also has a history of dementia, peripheral neuropathy, chronic kidney disease, hyperlipidemia and remote prostate cancer.   1. Mid abd pain/pancreatic cyst -- after thorough workup a pancreatic cyst was found which was aspirated and found to be benign. No evidence of pancreatic malignancy. No indication for follow-up of this lesion a stone these reassuring findings. Fortunately his pain has improved and he is eating well. I've asked that he follow-up on an as-needed basis and let me know if this pain returns. With he and his wife are happy with this plan.  2. GERD with hiatal hernia -- continue Prilosec 40 mg daily for now  3. CRC screening -- his wife inquired about screening colonoscopy. He had a colonoscopy in 2005 with Dr. [name] with one small hyperplastic polyp removed from the sigmoid. 10 year recall was recommended but deferred by Dr. [name] based on age. We have discussed this today and all parties feel this is appropriate. We will not plan further screening colonoscopy based on age.  Follow-up PRN

## 2015-02-10 NOTE — Patient Instructions (Signed)
Please follow up with Dr Pyrtle as needed. 

## 2015-02-12 ENCOUNTER — Telehealth: Payer: Self-pay | Admitting: Internal Medicine

## 2015-02-12 ENCOUNTER — Encounter: Payer: Self-pay | Admitting: Internal Medicine

## 2015-02-12 DIAGNOSIS — R350 Frequency of micturition: Secondary | ICD-10-CM | POA: Diagnosis not present

## 2015-02-12 DIAGNOSIS — K862 Cyst of pancreas: Secondary | ICD-10-CM | POA: Insufficient documentation

## 2015-02-12 DIAGNOSIS — Z0000 Encounter for general adult medical examination without abnormal findings: Secondary | ICD-10-CM | POA: Diagnosis not present

## 2015-02-12 DIAGNOSIS — N3941 Urge incontinence: Secondary | ICD-10-CM | POA: Diagnosis not present

## 2015-02-12 MED ORDER — TRAMADOL HCL 50 MG PO TABS: 50.0000 mg | ORAL_TABLET | Freq: Two times a day (BID) | ORAL | Status: DC

## 2015-02-12 MED ORDER — PREGABALIN 75 MG PO CAPS: 75.0000 mg | ORAL_CAPSULE | Freq: Two times a day (BID) | ORAL | Status: DC

## 2015-02-12 NOTE — Telephone Encounter (Signed)
Pt is requesting refill for traMADol (ULTRAM) 50 MG tablet [id number]  Pt also states the pregabalin (LYRICA) 50 MG capsule [id number] is not working well and is requesting the 75 mg   Pharmacy is [facility] on [address]

## 2015-02-12 NOTE — Telephone Encounter (Signed)
Okay to refill? Last refill 1/06. Last OV 12/25/14

## 2015-02-12 NOTE — Telephone Encounter (Signed)
Ok  - both rx's printed

## 2015-02-13 NOTE — Telephone Encounter (Signed)
RXs have been faxed to [facility]

## 2015-02-19 ENCOUNTER — Encounter: Payer: Self-pay | Admitting: Diagnostic Neuroimaging

## 2015-02-19 ENCOUNTER — Telehealth: Payer: Self-pay | Admitting: Diagnostic Neuroimaging

## 2015-02-19 ENCOUNTER — Ambulatory Visit (INDEPENDENT_AMBULATORY_CARE_PROVIDER_SITE_OTHER): Payer: Medicare Other | Admitting: Diagnostic Neuroimaging

## 2015-02-19 VITALS — BP 132/73 | HR 57 | Ht 71.0 in | Wt 204.2 lb

## 2015-02-19 DIAGNOSIS — G609 Hereditary and idiopathic neuropathy, unspecified: Secondary | ICD-10-CM

## 2015-02-19 DIAGNOSIS — F0390 Unspecified dementia without behavioral disturbance: Secondary | ICD-10-CM

## 2015-02-19 DIAGNOSIS — F03B0 Unspecified dementia, moderate, without behavioral disturbance, psychotic disturbance, mood disturbance, and anxiety: Secondary | ICD-10-CM

## 2015-02-19 MED ORDER — DONEPEZIL HCL 10 MG PO TABS: 10.0000 mg | ORAL_TABLET | Freq: Every day | ORAL | Status: DC

## 2015-02-19 MED ORDER — MEMANTINE HCL 10 MG PO TABS: 10.0000 mg | ORAL_TABLET | Freq: Two times a day (BID) | ORAL | Status: DC

## 2015-02-19 NOTE — Patient Instructions (Addendum)
Thank you for coming to see [place] at [facility]. I hope we have been able to provide you high quality care today.  You may receive a patient satisfaction survey over the next few weeks. We would appreciate your feedback and comments so that we may continue to improve ourselves and the health of our patients.  - start memantine 28m at bedtime; after 1 week increase to twice a day - try lidocaine 5% cream apply to feet 2-3 times per day for burning pain   ~~~~~~~~~~~~~~~~~~~~~~~~~~~~~~~~~~~~~~~~~~~~~~~~~~~~~~~~~~~~~~~~~  DR. Nyaira Hodgens'S GUIDE TO HAPPY AND HEALTHY LIVING These are some of my general health and wellness recommendations. Some of them may apply to you better than others. Please use common sense as you try these suggestions and feel free to ask me any questions.   ACTIVITY/FITNESS Mental, social, emotional and physical stimulation are very important for brain and body health. Try learning a new activity (arts, music, language, sports, games).  Keep moving your body to the best of your abilities. You can do this at home, inside or outside, the park, community center, gym or anywhere you like. Consider a physical therapist or personal trainer to get started. Consider the app Sworkit. Fitness trackers such as smart-watches, smart-phones or Fitbits can help as well.   NUTRITION Eat more plants: colorful vegetables, nuts, seeds and berries.  Eat less sugar, salt, preservatives and processed foods.  Avoid toxins such as cigarettes and alcohol.  Drink water when you are thirsty. Warm water with a slice of lemon is an excellent morning drink to start the day.  Consider these websites for more information The Nutrition Source (hhttps://www.henry-hernandez.biz/ Precision Nutrition (wWindowBlog.ch   RELAXATION Consider practicing mindfulness meditation or other relaxation techniques such as deep breathing, prayer, yoga,  tai chi, massage. See website mindful.org or the apps Headspace or Calm to help get started.   SLEEP Try to get at least 7-8+ hours sleep per day. Regular exercise and reduced caffeine will help you sleep better. Practice good sleep hygeine techniques. See website sleep.org for more information.   PLANNING Prepare estate planning, living will, healthcare POA documents. Sometimes this is best planned with the help of an attorney. Theconversationproject.org and agingwithdignity.org are excellent resources.

## 2015-02-19 NOTE — Addendum Note (Signed)
Addended by: Minna Antis on: 02/19/2015 03:55 PM   Modules accepted: Medications

## 2015-02-19 NOTE — Telephone Encounter (Signed)
Matthew [name], phone staff,  informed patient that the cream will be mailed to him.

## 2015-02-19 NOTE — Progress Notes (Signed)
[facility]  PATIENT: Matthew [name] DOB: [birth year]  REFERRING CLINICIAN: Hopper  HISTORY FROM: patient, wife REASON FOR VISIT: follow up    HISTORICAL  CHIEF COMPLAINT:  Chief Complaint  Patient presents with  . Dementia    rm 6, wifeSunday [name], MMSE 20  . Follow-up    6 month    HISTORY OF PRESENT ILLNESS:   UPDATE 02/19/15: Since last visit, patient doing well. Patient feels fine. Wife thinks pt is stable, but sometimes slightly worse. He is having comprehension / attention problems. Now having non-threatening visual hallucinations. Still driving (only when accompanied by wife). Also with burning feet sensation (on lyrica per PCP; neuropathy labs unremarkable).   UPDATE 07/03/14: Since last visit, lost to follow up. Memory loss is progressing. Patient in denial of memory problems. He stays active working in the yard. Still driving. Wife takes care of financial issues.   New HPI (02/13/12): 82 year old male with hypercholesteremia, here for evaluation of memory problems. Past one to 2 years patient has developed progressive short-term memory problems. Patient denies any significant memory problems. Patient's wife has noticed progressive problems including remembering tasks, conversations, recent events. He does repeat himself frequent. Having difficulty taking his medications as well as helping his wife take her medications. As outpatient previously for headache and neck pain. The patient referred for a different problem.  PRIOR HPI (05/16/11): 82 year old right-handed male with hypercholesterolemia, here for evaluation of headaches. Patient reports history of cervical spine surgery in the 1980s, following which he developed chronic headaches. He thinks that his headaches are generated from his chronic neck pain. Headaches have been worse the last 6-12 months. He is seen multiple neurologists in the past. Is not sure what medications he can treated with. He describes a  pain in the vertex of his head, throbbing sensation, without nausea or vomiting. He does have photophobia and phonophobia. He has tried tramadol without relief.  REVIEW OF SYSTEMS: Full 14 system review of systems performed and notable only for as per HPI.    ALLERGIES: No Known Allergies  HOME MEDICATIONS: Outpatient Prescriptions Prior to Visit  Medication Sig Dispense Refill  . aspirin EC 81 MG tablet Take 81 mg by mouth daily.    [name] donepezil (ARICEPT) 10 MG tablet Take 1 tablet (10 mg total) by mouth at bedtime. 30 tablet 12  . hydroxypropyl methylcellulose / hypromellose (ISOPTO TEARS / GONIOVISC) 2.5 % ophthalmic solution Place 1 drop into both eyes daily as needed for dry eyes.    [name] omeprazole (PRILOSEC) 40 MG capsule Take 1 capsule (40 mg total) by mouth daily. 90 capsule 3  . polyethylene glycol (MIRALAX / GLYCOLAX) packet Take 17 g by mouth daily as needed (constipation).     . pregabalin (LYRICA) 75 MG capsule Take 1 capsule (75 mg total) by mouth 2 (two) times daily. 60 capsule 3  . simvastatin (ZOCOR) 40 MG tablet TAKE 1 BY MOUTH AT BEDTIME 90 tablet 3  . traMADol (ULTRAM) 50 MG tablet Take 1 tablet (50 mg total) by mouth 2 (two) times daily. 60 tablet 0   No facility-administered medications prior to visit.    PAST MEDICAL HISTORY: Past Medical History  Diagnosis Date  . Arthritis   . Anxiety   . Hypertension   . Hyperlipidemia   . Chronic kidney disease   . Prostate cancer [facility])     Dr [name]  . Cataract     NOS  . Impotence of organic origin   .  PVD (peripheral vascular disease) ([place])   . Metabolic syndrome X   . Dysphagia     PMH of  . Diverticulosis of colon   . Hemorrhoids, internal   . Hiatal hernia   . Skin cancer   . History of colon polyps 2005    hyperplastic; Dr [name]  . Schatzki's ring     PAST SURGICAL HISTORY: Past Surgical History  Procedure Laterality Date  . Appendectomy  1950  . Cervical fusion    . Colonoscopy w/  polypectomy  2005    Dr [name]  . Prostatectomy  1995    Dr.[name]; no chemo or radiation  . Tonsillectomy    . Shoulder surgery      right  . Left heart catheterization with coronary angiogram N/A 03/02/2012    Procedure: LEFT HEART CATHETERIZATION WITH CORONARY ANGIOGRAM;  Surgeon: [name], MD;  Location: [facility] CATH LAB;  Service: Cardiovascular;  Laterality: N/A;  . Eus N/A 01/15/2015    Procedure: UPPER ENDOSCOPIC ULTRASOUND (EUS) RADIAL;  Surgeon: [name], MD;  Location: [facility] ENDOSCOPY;  Service: Endoscopy;  Laterality: N/A;    FAMILY HISTORY: Family History  Problem Relation Age of Onset  . Lung cancer Father     smoker  . Throat cancer Father   . Kidney disease Father     nephrectomy & partial nephrectomy  . Breast cancer Mother   . Dementia Mother   . Heart attack Mother 61  . Prostate cancer Brother   . Diabetes Maternal Grandmother   . Heart attack Maternal Grandmother     in 55s  . Heart disease Brother     x 3 (one brother with CAD in the 57s, another age 90s, third one 3s CABG)  . Stroke Neg Hx     SOCIAL HISTORY:  Social History   Social History  . Marital Status: Married    Spouse Name: Matthew [name]  . Number of Children: 4  . Years of Education: 12   Occupational History  . Retired     [employer]   Social History Main Topics  . Smoking status: Never Smoker   . Smokeless tobacco: Never Used  . Alcohol Use: No  . Drug Use: No  . Sexual Activity: Not on file   Other Topics Concern  . Not on file   Social History Narrative   Lives at home with wife.       PHYSICAL EXAM  GENERAL EXAM/CONSTITUTIONAL: Vitals:  Filed Vitals:   02/19/15 1400  BP: 132/73  Pulse: 57  Height: 5\' 11"  (1.803 m)  Weight: 204 lb 3.2 oz (92.625 kg)   Body mass index is 28.49 kg/(m^2). No exam data present  Patient is in no distress; well developed, nourished and groomed; neck is supple  CARDIOVASCULAR:  Examination of carotid  arteries is normal; no carotid bruits  Regular rate and rhythm, no murmurs  Examination of peripheral vascular system by observation and palpation is normal  EYES:  Ophthalmoscopic exam of optic discs and posterior segments is normal; no papilledema or hemorrhages  MUSCULOSKELETAL:  Gait, strength, tone, movements noted in Neurologic exam below  NEUROLOGIC: MENTAL STATUS:  MMSE - Mini Mental State Exam 02/19/2015 07/03/2014  Orientation to time 1 1  Orientation to Place 5 3  Registration 3 3  Attention/ Calculation 5 2  Recall 0 0  Language- name 2 objects 2 2  Language- repeat 0 0  Language- follow 3 step command 2 3  Language- read &  follow direction 1 1  Write a sentence 1 1  Copy design 0 0  Total score 20 16    awake, alert, oriented to person  REGISTERS 3/3; RECALLS 0/3  DECR ATTENTION  language fluent, comprehension intact, naming intact,   fund of knowledge appropriate  CRANIAL NERVE:   2nd, 3rd, 4th, 6th - pupils equal and reactive to light, visual fields full to confrontation, extraocular muscles intact, no nystagmus  5th - facial sensation symmetric  7th - facial strength symmetric  8th - hearing intact  9th - palate elevates symmetrically, uvula midline  11th - shoulder shrug symmetric  12th - tongue protrusion midline  MOTOR:   normal bulk and tone, full strength in the BUE, BLE  SENSORY:   normal and symmetric to light touch  COORDINATION:   finger-nose-finger, fine finger movements normal  REFLEXES:   deep tendon reflexes present and symmetric  GAIT/STATION:   narrow based gait; romberg is negative    DIAGNOSTIC DATA (LABS, IMAGING, TESTING) - I reviewed patient records, labs, notes, testing and imaging myself where available.  Lab Results  Component Value Date   WBC 7.2 12/10/2014   HGB 14.1 12/10/2014   HCT 42.7 12/10/2014   MCV 89.7 12/10/2014   PLT 163.0 12/10/2014      Component Value Date/Time   NA 141  12/10/2014 1143   K 4.5 12/10/2014 1143   CL 106 12/10/2014 1143   CO2 29 12/10/2014 1143   GLUCOSE 93 12/10/2014 1143   BUN 20 12/10/2014 1143   CREATININE 1.41 12/10/2014 1143   CALCIUM 9.2 12/10/2014 1143   PROT 6.6 12/10/2014 1143   ALBUMIN 4.0 12/10/2014 1143   AST 13 12/10/2014 1143   ALT 10 12/10/2014 1143   ALKPHOS 41 12/10/2014 1143   BILITOT 0.4 12/10/2014 1143   GFRNONAA 48.89 11/02/2009 0928   GFRAA 55 11/13/2006 0000   Lab Results  Component Value Date   CHOL 161 06/27/2014   HDL 51.80 06/27/2014   LDLCALC 89 06/27/2014   TRIG 100.0 06/27/2014   CHOLHDL 3 06/27/2014   Lab Results  Component Value Date   HGBA1C 5.9 02/02/2012   Lab Results  Component Value Date   VITAMINB12 249 09/24/2014   Lab Results  Component Value Date   TSH 2.01 09/24/2014    05/31/11 MRI brain  1. Mild perisylvian atrophy. 2. Mild non-specific periventricular gliosis.    ASSESSMENT AND PLAN  82 y.o. year old male here with progressive memory loss, consistent with dementia. Now with some visual hallucinations. May represent dementia with lewy bodies.   Dx:  Moderate dementia without behavioral disturbance  Hereditary and idiopathic peripheral neuropathy    PLAN: - continue donepezil  - add memantine - safety issues reviewed (driving, financial, home appliances, locks) - trial of lidocaine 5% ointment for neuropathy pain in feet  Meds ordered this encounter  Medications  . memantine (NAMENDA) 10 MG tablet    Sig: Take 1 tablet (10 mg total) by mouth 2 (two) times daily.    Dispense:  60 tablet    Refill:  12  . donepezil (ARICEPT) 10 MG tablet    Sig: Take 1 tablet (10 mg total) by mouth at bedtime.    Dispense:  30 tablet    Refill:  12   Return in about 6 months (around 08/19/2015).    [name], MD [id number], [id number] PM Certified in Neurology, Neurophysiology and Neuroimaging  [facility] [address] [place],  [place]  [address] [phone]

## 2015-02-19 NOTE — Telephone Encounter (Signed)
Pt's wife called said they went to pharmacy and were able to pick up 2 RX but the topical cream was not called in, she is wanting to use on his feet tonight. Please call asap.

## 2015-03-12 DIAGNOSIS — Z0101 Encounter for examination of eyes and vision with abnormal findings: Secondary | ICD-10-CM | POA: Diagnosis not present

## 2015-04-21 ENCOUNTER — Other Ambulatory Visit: Payer: Self-pay | Admitting: Emergency Medicine

## 2015-04-21 MED ORDER — TRAMADOL HCL 50 MG PO TABS: 50.0000 mg | ORAL_TABLET | Freq: Two times a day (BID) | ORAL | Status: DC

## 2015-04-21 NOTE — Telephone Encounter (Signed)
Refill for Tramadol faxed to [facility]

## 2015-04-30 ENCOUNTER — Observation Stay (HOSPITAL_COMMUNITY)
Admission: EM | Admit: 2015-04-30 | Discharge: 2015-05-01 | Disposition: A | Discharge status: Home / Self Care | Payer: Medicare Other | Attending: Internal Medicine | Admitting: Internal Medicine

## 2015-04-30 ENCOUNTER — Encounter (HOSPITAL_COMMUNITY): Payer: Self-pay | Admitting: *Deleted

## 2015-04-30 ENCOUNTER — Emergency Department (HOSPITAL_COMMUNITY): Payer: Medicare Other

## 2015-04-30 DIAGNOSIS — K648 Other hemorrhoids: Secondary | ICD-10-CM | POA: Insufficient documentation

## 2015-04-30 DIAGNOSIS — E785 Hyperlipidemia, unspecified: Secondary | ICD-10-CM | POA: Diagnosis present

## 2015-04-30 DIAGNOSIS — R011 Cardiac murmur, unspecified: Secondary | ICD-10-CM | POA: Diagnosis not present

## 2015-04-30 DIAGNOSIS — R0789 Other chest pain: Secondary | ICD-10-CM | POA: Diagnosis not present

## 2015-04-30 DIAGNOSIS — N183 Chronic kidney disease, stage 3 unspecified: Secondary | ICD-10-CM | POA: Diagnosis present

## 2015-04-30 DIAGNOSIS — G309 Alzheimer's disease, unspecified: Secondary | ICD-10-CM | POA: Diagnosis not present

## 2015-04-30 DIAGNOSIS — F0390 Unspecified dementia without behavioral disturbance: Secondary | ICD-10-CM | POA: Diagnosis present

## 2015-04-30 DIAGNOSIS — G629 Polyneuropathy, unspecified: Secondary | ICD-10-CM

## 2015-04-30 DIAGNOSIS — I2510 Atherosclerotic heart disease of native coronary artery without angina pectoris: Secondary | ICD-10-CM | POA: Diagnosis not present

## 2015-04-30 DIAGNOSIS — I10 Essential (primary) hypertension: Secondary | ICD-10-CM | POA: Insufficient documentation

## 2015-04-30 DIAGNOSIS — I129 Hypertensive chronic kidney disease with stage 1 through stage 4 chronic kidney disease, or unspecified chronic kidney disease: Secondary | ICD-10-CM | POA: Diagnosis not present

## 2015-04-30 DIAGNOSIS — N189 Chronic kidney disease, unspecified: Secondary | ICD-10-CM | POA: Diagnosis not present

## 2015-04-30 DIAGNOSIS — Z79899 Other long term (current) drug therapy: Secondary | ICD-10-CM | POA: Diagnosis not present

## 2015-04-30 DIAGNOSIS — M1990 Unspecified osteoarthritis, unspecified site: Secondary | ICD-10-CM | POA: Insufficient documentation

## 2015-04-30 DIAGNOSIS — N529 Male erectile dysfunction, unspecified: Secondary | ICD-10-CM | POA: Insufficient documentation

## 2015-04-30 DIAGNOSIS — E8881 Metabolic syndrome: Secondary | ICD-10-CM | POA: Diagnosis not present

## 2015-04-30 DIAGNOSIS — F03B0 Unspecified dementia, moderate, without behavioral disturbance, psychotic disturbance, mood disturbance, and anxiety: Secondary | ICD-10-CM | POA: Diagnosis present

## 2015-04-30 DIAGNOSIS — F419 Anxiety disorder, unspecified: Secondary | ICD-10-CM | POA: Diagnosis not present

## 2015-04-30 DIAGNOSIS — R079 Chest pain, unspecified: Secondary | ICD-10-CM | POA: Diagnosis not present

## 2015-04-30 DIAGNOSIS — R0602 Shortness of breath: Secondary | ICD-10-CM | POA: Diagnosis not present

## 2015-04-30 DIAGNOSIS — I739 Peripheral vascular disease, unspecified: Secondary | ICD-10-CM | POA: Diagnosis not present

## 2015-04-30 DIAGNOSIS — Q394 Esophageal web: Secondary | ICD-10-CM | POA: Diagnosis not present

## 2015-04-30 DIAGNOSIS — F0280 Dementia in other diseases classified elsewhere without behavioral disturbance: Secondary | ICD-10-CM | POA: Diagnosis not present

## 2015-04-30 HISTORY — DX: Alzheimer's disease, unspecified: G30.9

## 2015-04-30 HISTORY — DX: Atherosclerotic heart disease of native coronary artery without angina pectoris: I25.10

## 2015-04-30 HISTORY — DX: Dementia in other diseases classified elsewhere, unspecified severity, without behavioral disturbance, psychotic disturbance, mood disturbance, and anxiety: F02.80

## 2015-04-30 LAB — BASIC METABOLIC PANEL
ANION GAP: 10 (ref 5–15)
BUN: 20 mg/dL (ref 6–20)
CALCIUM: 9.3 mg/dL (ref 8.9–10.3)
CHLORIDE: 108 mmol/L (ref 101–111)
CO2: 26 mmol/L (ref 22–32)
Creatinine, Ser: 1.56 mg/dL — ABNORMAL HIGH (ref 0.61–1.24)
GFR calc non Af Amer: 39 mL/min — ABNORMAL LOW (ref >60)
GFR, EST AFRICAN AMERICAN: 46 mL/min — AB (ref >60)
Glucose, Bld: 105 mg/dL — ABNORMAL HIGH (ref 65–99)
Potassium: 4.2 mmol/L (ref 3.5–5.1)
Sodium: 144 mmol/L (ref 135–145)

## 2015-04-30 LAB — I-STAT TROPONIN, ED: TROPONIN I, POC: 0 ng/mL (ref 0.00–0.08)

## 2015-04-30 LAB — CBC
HCT: 42.4 % (ref 39.0–52.0)
HEMOGLOBIN: 13.9 g/dL (ref 13.0–17.0)
MCH: 29.4 pg (ref 26.0–34.0)
MCHC: 32.8 g/dL (ref 30.0–36.0)
MCV: 89.8 fL (ref 78.0–100.0)
Platelets: 171 10*3/uL (ref 150–400)
RBC: 4.72 MIL/uL (ref 4.22–5.81)
RDW: 13.4 % (ref 11.5–15.5)
WBC: 5.6 10*3/uL (ref 4.0–10.5)

## 2015-04-30 LAB — TROPONIN I: Troponin I: <0.03 ng/mL (ref <0.031)

## 2015-04-30 MED ORDER — ONDANSETRON HCL 4 MG/2ML IJ SOLN
4.0000 mg | Freq: Four times a day (QID) | INTRAMUSCULAR | Status: DC | PRN
  Administered 2015-05-01: 4 mg via INTRAVENOUS
  Filled 2015-04-30: qty 2

## 2015-04-30 MED ORDER — ENOXAPARIN SODIUM 40 MG/0.4ML ~~LOC~~ SOLN
40.0000 mg | Freq: Every day | SUBCUTANEOUS | Status: DC
  Administered 2015-04-30: 40 mg via SUBCUTANEOUS
  Filled 2015-04-30: qty 0.4

## 2015-04-30 MED ORDER — GI COCKTAIL ~~LOC~~: 30.0000 mL | Freq: Four times a day (QID) | ORAL | Status: DC | PRN

## 2015-04-30 MED ORDER — PREGABALIN 50 MG PO CAPS
75.0000 mg | ORAL_CAPSULE | Freq: Two times a day (BID) | ORAL | Status: DC
  Administered 2015-04-30 – 2015-05-01 (×2): 75 mg via ORAL
  Filled 2015-04-30 (×2): qty 1

## 2015-04-30 MED ORDER — ASPIRIN EC 81 MG PO TBEC
81.0000 mg | DELAYED_RELEASE_TABLET | Freq: Every day | ORAL | Status: DC
  Administered 2015-05-01: 81 mg via ORAL
  Filled 2015-04-30: qty 1

## 2015-04-30 MED ORDER — ASPIRIN 81 MG PO CHEW
324.0000 mg | CHEWABLE_TABLET | Freq: Once | ORAL | Status: AC
  Administered 2015-04-30: 324 mg via ORAL
  Filled 2015-04-30: qty 4

## 2015-04-30 MED ORDER — NITROGLYCERIN 0.4 MG SL SUBL
0.4000 mg | SUBLINGUAL_TABLET | SUBLINGUAL | Status: DC | PRN
  Administered 2015-04-30 (×4): 0.4 mg via SUBLINGUAL
  Filled 2015-04-30 (×2): qty 1

## 2015-04-30 MED ORDER — DONEPEZIL HCL 10 MG PO TABS
10.0000 mg | ORAL_TABLET | Freq: Every day | ORAL | Status: DC
  Administered 2015-04-30: 10 mg via ORAL
  Filled 2015-04-30: qty 1

## 2015-04-30 MED ORDER — ACETAMINOPHEN 325 MG PO TABS
650.0000 mg | ORAL_TABLET | ORAL | Status: DC | PRN
  Administered 2015-05-01: 650 mg via ORAL
  Filled 2015-04-30: qty 2

## 2015-04-30 MED ORDER — PANTOPRAZOLE SODIUM 40 MG PO TBEC
40.0000 mg | DELAYED_RELEASE_TABLET | Freq: Every day | ORAL | Status: DC
  Administered 2015-05-01: 40 mg via ORAL
  Filled 2015-04-30: qty 1

## 2015-04-30 MED ORDER — LORAZEPAM 1 MG PO TABS
1.0000 mg | ORAL_TABLET | Freq: Once | ORAL | Status: AC
  Administered 2015-04-30: 1 mg via ORAL
  Filled 2015-04-30: qty 1

## 2015-04-30 MED ORDER — SIMVASTATIN 40 MG PO TABS: 40.0000 mg | ORAL_TABLET | Freq: Every day | ORAL | Status: DC

## 2015-04-30 MED ORDER — MEMANTINE HCL 5 MG PO TABS
10.0000 mg | ORAL_TABLET | Freq: Two times a day (BID) | ORAL | Status: DC
  Administered 2015-04-30 – 2015-05-01 (×2): 10 mg via ORAL
  Filled 2015-04-30 (×2): qty 2

## 2015-04-30 MED ORDER — POLYETHYLENE GLYCOL 3350 17 G PO PACK: 17.0000 g | PACK | Freq: Every day | ORAL | Status: DC | PRN

## 2015-04-30 NOTE — Progress Notes (Signed)
Patient is very anxious and nervous, Patient's daughter is requesting he have something to calm him and him rest. Matthew [name]

## 2015-04-30 NOTE — ED Provider Notes (Signed)
CSN: [id number]     Arrival date & time 04/30/15  1539 History   First MD Initiated Contact with Patient 04/30/15 1617     Chief Complaint  Patient presents with  . Chest Pain     HPI  Matthew [name] is an 82 y.o. male with history of HTN, HLD, CKD, chronic abdominal pain, and Alzheimer's who presents to the ED for evaluation of chest pain. He states that the chest pain started today around 3 PM while he was playing with his granddaughter and chasing her around. He states the pain is localized to his left chest and does not radiate. He states that when the pain started there was associated SOB but denies SOB now. Denies diaphoresis. Denies N/V. He states he does have some pain in his right neck and a mild posterior headache. His daughter reports that these pains are chronic. He has not tried anything to alleviate his chest pain. He states now in the ED the pain has improved and is a dull ache, rates it a 5/10. Denies tobacco use. Endorses fam hx of cardiac issues but has never been diagnosed with heart disease himself. He does appear to have had a cath in 2014 on EMR review that revealed mild, nonobstructive CAD.   Past Medical History  Diagnosis Date  . Arthritis   . Anxiety   . Hypertension   . Hyperlipidemia   . Chronic kidney disease   . Prostate cancer [facility])     Dr [name]  . Cataract     NOS  . Impotence of organic origin   . PVD (peripheral vascular disease) ([place])   . Metabolic syndrome X   . Dysphagia     PMH of  . Diverticulosis of colon   . Hemorrhoids, internal   . Hiatal hernia   . Skin cancer   . History of colon polyps 2005    hyperplastic; Dr [name]  . Schatzki's ring    Past Surgical History  Procedure Laterality Date  . Appendectomy  1950  . Cervical fusion    . Colonoscopy w/ polypectomy  2005    Dr [name]  . Prostatectomy  1995    Dr.[name]; no chemo or radiation  . Tonsillectomy    . Shoulder surgery      right  . Left heart catheterization  with coronary angiogram N/A 03/02/2012    Procedure: LEFT HEART CATHETERIZATION WITH CORONARY ANGIOGRAM;  Surgeon: [name], MD;  Location: [facility] CATH LAB;  Service: Cardiovascular;  Laterality: N/A;  . Eus N/A 01/15/2015    Procedure: UPPER ENDOSCOPIC ULTRASOUND (EUS) RADIAL;  Surgeon: [name], MD;  Location: [facility] ENDOSCOPY;  Service: Endoscopy;  Laterality: N/A;   Family History  Problem Relation Age of Onset  . Lung cancer Father     smoker  . Throat cancer Father   . Kidney disease Father     nephrectomy & partial nephrectomy  . Breast cancer Mother   . Dementia Mother   . Heart attack Mother 11  . Prostate cancer Brother   . Diabetes Maternal Grandmother   . Heart attack Maternal Grandmother     in 13s  . Heart disease Brother     x 3 (one brother with CAD in the 68s, another age 4s, third one 39s CABG)  . Stroke Neg Hx    Social History  Substance Use Topics  . Smoking status: Never Smoker   . Smokeless tobacco: Never Used  . Alcohol Use: No  Review of Systems  All other systems reviewed and are negative.     Allergies  Review of patient's allergies indicates no known allergies.  Home Medications   Prior to Admission medications   Medication Sig Start Date End Date Taking? Authorizing Provider  aspirin EC 81 MG tablet Take 81 mg by mouth daily.    Historical Provider, MD  donepezil (ARICEPT) 10 MG tablet Take 1 tablet (10 mg total) by mouth at bedtime. 02/19/15   [name], MD  hydroxypropyl methylcellulose / hypromellose (ISOPTO TEARS / GONIOVISC) 2.5 % ophthalmic solution Place 1 drop into both eyes daily as needed for dry eyes.    Historical Provider, MD  lidocaine (XYLOCAINE) 5 % ointment Apply 1 application topically as needed. Prescription faxed to [place], 12 refills 02/19/15   Historical Provider, MD  memantine (NAMENDA) 10 MG tablet Take 1 tablet (10 mg total) by mouth 2 (two) times daily. 02/19/15   [name], MD   omeprazole (PRILOSEC) 40 MG capsule Take 1 capsule (40 mg total) by mouth daily. 12/11/14   [name], MD  polyethylene glycol (MIRALAX / Floria Raveling) packet Take 17 g by mouth daily as needed (constipation).     Historical Provider, MD  pregabalin (LYRICA) 75 MG capsule Take 1 capsule (75 mg total) by mouth 2 (two) times daily. 02/12/15   [name], MD  simvastatin (ZOCOR) 40 MG tablet TAKE 1 BY MOUTH AT BEDTIME 09/25/14   [name], MD  traMADol (ULTRAM) 50 MG tablet Take 1 tablet (50 mg total) by mouth 2 (two) times daily. 04/21/15   [name], MD   BP 152/77 mmHg  Pulse 62  Temp(Src) 97.3 F (36.3 C) (Oral)  Resp 18  SpO2 100% Physical Exam  Constitutional: He is oriented to person, place, and time.  HENT:  Right Ear: External ear normal.  Left Ear: External ear normal.  Nose: Nose normal.  Mouth/Throat: Oropharynx is clear and moist. No oropharyngeal exudate.  Eyes: Conjunctivae and EOM are normal. Pupils are equal, round, and reactive to light.  Neck: Normal range of motion. Neck supple.  Cardiovascular: Normal rate, regular rhythm and intact distal pulses.   Murmur heard. Pulmonary/Chest: Effort normal and breath sounds normal. No respiratory distress. He has no wheezes. He exhibits no tenderness.  Abdominal: Soft. Bowel sounds are normal. He exhibits no distension. There is no tenderness. There is no rebound and no guarding.  Musculoskeletal: He exhibits no edema.  Neurological: He is alert and oriented to person, place, and time. No cranial nerve deficit.  Skin: Skin is warm and dry.  Psychiatric: He has a normal mood and affect.  Nursing note and vitals reviewed.   ED Course  Procedures (including critical care time) Labs Review Labs Reviewed  BASIC METABOLIC PANEL - Abnormal; Notable for the following:    Glucose, Bld 105 (*)    Creatinine, Ser 1.56 (*)    GFR calc non Af Amer 39 (*)    GFR calc Af Amer 46 (*)    All other components within normal  limits  CBC  I-STAT TROPOININ, ED    Imaging Review Dg Chest 2 View  04/30/2015  CLINICAL DATA:  Pt c/o left sided sharp chest pains x today. Pain does not radiate. Pt denies any other chest complaints. Pt states his wife is an inpatient in the hospital so the pain may be stress induced. Hx cardiac cath. EXAM: CHEST  2 VIEW COMPARISON:  02/11/2011 FINDINGS: Normal mediastinum and cardiac silhouette. Normal pulmonary vasculature. No evidence of effusion, infiltrate, or pneumothorax. No acute bony abnormality. Degenerative osteophytosis of the thoracic spine. IMPRESSION: No acute cardiopulmonary process. Electronically Signed   By: Suzy Bouchard M.D.   On: 04/30/2015 16:01   I have personally reviewed and evaluated these images and lab results as part of my medical decision-making.   EKG Interpretation   Date/Time:  Thursday April 30 2015 15:45:31 EDT Ventricular Rate:  62 PR Interval:  134 QRS Duration: 80 QT Interval:  384 QTC Calculation: 389 R Axis:   35 Text Interpretation:  Normal sinus rhythm with sinus arrhythmia ST \\T \ T  wave abnormality, consider lateral ischemia Abnormal ECG No significant  change from 02/22/2012 Confirmed by DELO  MD, DOUGLAS ([id number]) on 04/30/2015  5:54:57 PM      MDM   Final diagnoses:  Chest pain, unspecified chest pain type    EKG nonacute. Trop negative. Cr slightly up from baseline to 1.56. CXR negative for acute findings. Pt does still endorse some mild but tolerable chest pain in the ED. ASA 324mg  ordered. However, HEART score is 5. Given age, exertional chest pain, and co-morbidities, I will call hospitalist team for admission for chest pain rule out. Pt and his daughter are in agreement with this plan.    I spoke to Dr. Jonnie Finner of the hospitalist team who will admit pt to tele obs.  Anne Ng, PA-C 04/30/15 1829  Veryl Speak, MD 04/30/15 2316

## 2015-04-30 NOTE — ED Notes (Signed)
Now c/o severe headache r/t administration of NTG tabs x 3.

## 2015-04-30 NOTE — H&P (Addendum)
[facility] History and Physical  WARN KAJIWARA [id number] DOB: [birth year] DOA: 04/30/2015  Referring physician: Dr. Stark Jock PCP: [name], MD   Chief Complaint: Chest pain  HPI: Matthew [name] is a 82 y.o. male with hx of HL, alzheimer's disease, peripheral neuropathy, chron abd pain who presented to ED today with chest pain.  About 3 pm today pt went outside and was playing w his granddaughter and had to come back in due to chest pains.  +assoc SOB. Pain localized over the left breast w/o radiation.  No nausea/ diaphoresis.  Pain continued thru EMS ride, given SL NTG and now pain is "much better" but not gone completely.  EKG showed NSR w TWI ant-lat leads, no ST depression.  Asked to see patient for chest pain admit.    Patient says and daughter concurs that he has been having milder L sided chest pain for several days now but his episode was worse than the others.  No past hx of any heart problems.  Has hx of "reflux" but that never caused pain like this per pt.   Never smoked, +high cholesterol on medication, +fam hx with 3 brothers who have had an MI.  One died in his late 50's of sudden death.  His daughter is here and she is the primary family member active in his care; her husband died this year of sudden death and the daughter is worried that Matthew [name] could have heart disease.    Patient has memory problems , but is active and mows the lawn.  No recent cough, colds, chills/ fevers, no n/v/d.  He has some chronic abd pain (periumbilical) which is no worse or better than usual.  He has been incontinent of urine following prostatectomy for pros Ca in the 1990's and wears diapers for this.  No hx CVA, claudication or other vascular disease. Never smoked.  Had heart cath in 2011 per the daughter and it was "normal".    Patient went to school through 2nd grade.  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  ROS  no  joint pain   frequent HA's  no blurry vision  no rash  no diarrhea  no nausea/ vomiting  no dysuria  no difficulty voiding  no change in urine color    Where does patient live home Can patient participate in ADLs? yes  Past Medical History  Past Medical History  Diagnosis Date  . Arthritis   . Anxiety   . Hypertension   . Hyperlipidemia   . Chronic kidney disease   . Prostate cancer [facility])     Dr [name]  . Cataract     NOS  . Impotence of organic origin   . PVD (peripheral vascular disease) ([place])   . Metabolic syndrome X   . Dysphagia     PMH of  . Diverticulosis of colon   . Hemorrhoids, internal   . Hiatal hernia   . Skin cancer   . History of colon polyps 2005    hyperplastic; Dr [name]  . Schatzki's ring    Past Surgical History  Past Surgical History  Procedure Laterality Date  . Appendectomy  1950  . Cervical fusion    . Colonoscopy w/ polypectomy  2005    Dr [name]  . Prostatectomy  1995    Dr.[name]; no chemo or radiation  . Tonsillectomy    . Shoulder surgery  right  . Left heart catheterization with coronary angiogram N/A 03/02/2012    Procedure: LEFT HEART CATHETERIZATION WITH CORONARY ANGIOGRAM;  Surgeon: [name], MD;  Location[facility] CATH LAB;  Service: Cardiovascular;  Laterality: N/A;  . Eus N/A 01/15/2015    Procedure: UPPER ENDOSCOPIC ULTRASOUND (EUS) RADIAL;  Surgeon: [name], MD;  Location: [facility] ENDOSCOPY;  Service: Endoscopy;  Laterality: N/A;   Family History  Family History  Problem Relation Age of Onset  . Lung cancer Father     smoker  . Throat cancer Father   . Kidney disease Father     nephrectomy & partial nephrectomy  . Breast cancer Mother   . Dementia Mother   . Heart attack Mother 54  . Prostate cancer Brother   . Diabetes Maternal Grandmother   . Heart attack Maternal Grandmother     in 40s  . Heart disease Brother     x 3 (one brother with CAD in the 62s, another age 33s, third one 30s  CABG)  . Stroke Neg Hx    Social History  reports that he has never smoked. He has never used smokeless tobacco. He reports that he does not drink alcohol or use illicit drugs. Allergies No Known Allergies Home medications Prior to Admission medications   Medication Sig Start Date End Date Taking? Authorizing Provider  aspirin EC 81 MG tablet Take 81 mg by mouth daily.   Yes Historical Provider, MD  donepezil (ARICEPT) 10 MG tablet Take 1 tablet (10 mg total) by mouth at bedtime. 02/19/15  Yes [name], MD  lidocaine (XYLOCAINE) 5 % ointment Apply 1 application topically as needed. Prescription faxed to [place] 240 Gms, 12 refills 02/19/15  Yes Historical Provider, MD  memantine (NAMENDA) 10 MG tablet Take 1 tablet (10 mg total) by mouth 2 (two) times daily. 02/19/15  Yes [name], MD  omeprazole (PRILOSEC) 40 MG capsule Take 1 capsule (40 mg total) by mouth daily. 12/11/14  Yes [name], MD  polyethylene glycol (MIRALAX / GLYCOLAX) packet Take 17 g by mouth daily as needed (constipation).    Yes Historical Provider, MD  pregabalin (LYRICA) 75 MG capsule Take 1 capsule (75 mg total) by mouth 2 (two) times daily. 02/12/15  Yes [name], MD  simvastatin (ZOCOR) 40 MG tablet TAKE 1 BY MOUTH AT BEDTIME 09/25/14  Yes [name], MD  traMADol (ULTRAM) 50 MG tablet Take 1 tablet (50 mg total) by mouth 2 (two) times daily. 04/21/15  Yes [name], MD  lidocaine-prilocaine (EMLA) cream APPLY 2-3 GRAMS (1 GRAM=1 INCH) TO AFFECTED AREA 4 TIMES DAILY 02/24/15   Historical Provider, MD   Liver Function Tests No results for input(s): AST, ALT, ALKPHOS, BILITOT, PROT, ALBUMIN in the last 168 hours. No results for input(s): LIPASE, AMYLASE in the last 168 hours. CBC  Recent Labs Lab 04/30/15 1558  WBC 5.6  HGB 13.9  HCT 42.4  MCV 89.8  PLT [id number]   Basic Metabolic Panel  Recent Labs Lab 04/30/15 1558  NA 144  K 4.2  CL 108  CO2 26  GLUCOSE 105*  BUN 20   CREATININE 1.56*  CALCIUM 9.3     Filed Vitals:   04/30/15 1700 04/30/15 1730 04/30/15 1800 04/30/15 1830  BP: 164/86 151/78 167/89 142/89  Pulse: 61 57 90 64  Temp:      TempSrc:      Resp: 16 12 14 17   SpO2: 99%  100% 86% 98%   Exam: VSS pt alert, no distress No rash, cyanosis or gangrene Sclera anicteric, throat clear  No jvd or bruits Chest clear bilat throughout RRR 2/6 soft sem rusb, no RG Abd soft ntnd no mass or ascites +bs GU normal male MS no joint effusions or deformity Ext no LE or UE edema / no wounds or ulcers Neuro is alert, Ox 3, nf   EKG (independently reviewed) > NSR 61 bpm, poss ischemic ST-T changes (flipped T's) ant-lat leads CXR (independently reviewed) > no acute disease or infiltrates  HEART score - 5  Home medications: asa, aricept, namenda, prilosec, miralax, lyrica, ultram, zocor  Na 144 K 4.2  CO2 26  Cr 1.56  BUN 20  Glu 105   Trop 0.00   Assessment: 1. Chest pain - HEART score 5, admit, r/o MI, card consult in am.   2. HL - statin 3. Alzheimer's - cont meds 4. Neuropathy - cont lyrica 5. CKD - creat at baseline ~ 1.5  Plan - as above   DVT Prophylaxis lovenox  Code Status: full  Family Communication: dtr here  Disposition Plan: home when stable    [employer] D [facility] Pager [phone]  Cell [phone]  If 7PM-7AM, please contact night-coverage [email or web address] Password [email or web address] 04/30/2015, 7:06 PM

## 2015-04-30 NOTE — ED Notes (Signed)
Attempted to call report to floor. Informed by NS that floor RN in a contact room & will return call to ED RN. Left # to call for report.

## 2015-04-30 NOTE — ED Notes (Signed)
Patient ambulated to restroom unassisted to urinate and change adult brief. No distress noted.

## 2015-04-30 NOTE — ED Notes (Signed)
Patient c/o increased chest pain at this time. 10/10. NTG SL administered.

## 2015-04-30 NOTE — ED Notes (Signed)
Pt c/o chest pain on left chest that is throbbing since today. States he has chronic abd pain.

## 2015-05-01 ENCOUNTER — Observation Stay (HOSPITAL_COMMUNITY): Payer: Medicare Other

## 2015-05-01 ENCOUNTER — Observation Stay (HOSPITAL_BASED_OUTPATIENT_CLINIC_OR_DEPARTMENT_OTHER): Payer: Medicare Other

## 2015-05-01 ENCOUNTER — Encounter (HOSPITAL_COMMUNITY): Payer: Self-pay | Admitting: Physician Assistant

## 2015-05-01 DIAGNOSIS — R072 Precordial pain: Secondary | ICD-10-CM

## 2015-05-01 DIAGNOSIS — M1990 Unspecified osteoarthritis, unspecified site: Secondary | ICD-10-CM | POA: Diagnosis not present

## 2015-05-01 DIAGNOSIS — I10 Essential (primary) hypertension: Secondary | ICD-10-CM | POA: Diagnosis not present

## 2015-05-01 DIAGNOSIS — R079 Chest pain, unspecified: Secondary | ICD-10-CM | POA: Diagnosis not present

## 2015-05-01 DIAGNOSIS — E785 Hyperlipidemia, unspecified: Secondary | ICD-10-CM | POA: Diagnosis not present

## 2015-05-01 DIAGNOSIS — G309 Alzheimer's disease, unspecified: Secondary | ICD-10-CM | POA: Diagnosis not present

## 2015-05-01 DIAGNOSIS — I2510 Atherosclerotic heart disease of native coronary artery without angina pectoris: Secondary | ICD-10-CM | POA: Diagnosis not present

## 2015-05-01 LAB — ECHOCARDIOGRAM COMPLETE
Height: 71 in
Weight: 3164.8 oz

## 2015-05-01 LAB — NM MYOCAR MULTI W/SPECT W/WALL MOTION / EF
CHL CUP NUCLEAR SDS: 3
CHL CUP NUCLEAR SSS: 15
CHL CUP RESTING HR STRESS: 57 {beats}/min
CSEPPHR: 75 {beats}/min
LHR: 0.3
LV dias vol: 65 mL (ref 62–150)
LVSYSVOL: 24 mL
SRS: 12
TID: 1.4

## 2015-05-01 LAB — TROPONIN I: Troponin I: <0.03 ng/mL (ref <0.031)

## 2015-05-01 MED ORDER — REGADENOSON 0.4 MG/5ML IV SOLN
0.4000 mg | Freq: Once | INTRAVENOUS | Status: AC
  Administered 2015-05-01: 0.4 mg via INTRAVENOUS
  Filled 2015-05-01: qty 5

## 2015-05-01 MED ORDER — TECHNETIUM TC 99M SESTAMIBI - CARDIOLITE
30.0000 | Freq: Once | INTRAVENOUS | Status: AC | PRN
  Administered 2015-05-01: 30 via INTRAVENOUS

## 2015-05-01 MED ORDER — LORAZEPAM 0.5 MG PO TABS: 0.5000 mg | ORAL_TABLET | Freq: Two times a day (BID) | ORAL | Status: DC | PRN

## 2015-05-01 MED ORDER — TECHNETIUM TC 99M SESTAMIBI GENERIC - CARDIOLITE
10.0000 | Freq: Once | INTRAVENOUS | Status: AC | PRN
  Administered 2015-05-01: 10 via INTRAVENOUS

## 2015-05-01 MED ORDER — REGADENOSON 0.4 MG/5ML IV SOLN
INTRAVENOUS | Status: AC
  Filled 2015-05-01: qty 5

## 2015-05-01 NOTE — Consult Note (Signed)
CARDIOLOGY CONSULT NOTE   Patient ID: Matthew [name] MRN: [id number] DOB/AGE: [birth year] 82 y.o.  Admit date: 04/30/2015  Primary Physician   [name], MD Primary Cardiologist   [name] Reason for Consultation  Chest pain Requesting Physician  Dr. [name]  HPI: Matthew [name] is a 82 y.o. male with a history of Alzheimer's dementia, HTN, HL, neuropathy, non obstructive CAD, CKD, remote hx of prostate cancer, pancreatic cystic mass (felt benign cyst) and chronic abdominal pain  who presented to [facility] ED for evaluation of chest pain.   The patient was seen by [name] (02/2012- last seen) for abnormal EKG. He was noted to have T-wave inversions in his anterior leads this year during routine exam. F/u stress perfusion study did not demonstrated evidence of ischemia on images. However, he did have 2 mm horizontal ST changes in the lateral leads. He did have chest discomfort with exertion. F/u cath showed mild non obstructive CAD.   Wife is admitted for stroke. He was walking to parking graduate with his daughter and suddenly had a left-sided anterior chest pain. He described the pain as a sharp and associated with shortness of breath. He states that he was trying to catch up with her daughter who usually walks very fast. He also admits to having abdominal discomfort which seems chronic. The patient  denies diaphoresis or radiation of pain. The pain is persisted for few hours while visiting her wife and daughter brought him to the emergency department for further evaluation. His pain improved with sublingual nitroglycerin x 1. While waiting in ED for bad he again had a episode of sharp chest pain that relieved with sublingual nitroglycerin x 3. Patient seems very anxious.  Point-of-care troponin negative. Troponin I x 2 negative. Creatinine of 1.56. Chest x-ray clear. EKG shows sinus rhythm at rate of 60 bpm nonspecific T-wave abnormality in l, AVL and V4 which appears somewhat similar to  prior EKG. EKG shows new T-wave inversion in lead V5 and V6 this morning. Currently chest pain-free.  Daughter at bedside states that patient has recently have some left-sided chest discomfort for the past few days.  Past Medical History  Diagnosis Date  . Arthritis   . Anxiety   . Hypertension   . Hyperlipidemia   . Chronic kidney disease   . Prostate cancer [facility])     Dr [name]  . Cataract     NOS  . Impotence of organic origin   . PVD (peripheral vascular disease) ([place])   . Metabolic syndrome X   . Dysphagia     PMH of  . Diverticulosis of colon   . Hemorrhoids, internal   . Hiatal hernia   . Skin cancer   . History of colon polyps 2005    hyperplastic; Dr [name]  . Schatzki's ring   . Alzheimer's dementia     "don't know what stage but it's not early" (04/30/2015)     Past Surgical History  Procedure Laterality Date  . Appendectomy  1950  . Cervical fusion    . Colonoscopy w/ polypectomy  2005    Dr [name]  . Prostatectomy  1995    Dr.[name]; no chemo or radiation  . Tonsillectomy    . Shoulder surgery      right  . Left heart catheterization with coronary angiogram N/A 03/02/2012    Procedure: LEFT HEART CATHETERIZATION WITH CORONARY ANGIOGRAM;  Surgeon: [name], MD;  Location: [facility] CATH LAB;  Service: Cardiovascular;  Laterality: N/A;  . Eus N/A 01/15/2015    Procedure: UPPER ENDOSCOPIC ULTRASOUND (EUS) RADIAL;  Surgeon: [name], MD;  Location: [facility] ENDOSCOPY;  Service: Endoscopy;  Laterality: N/A;  . Back surgery      No Known Allergies  I have reviewed the patient's current medications . aspirin EC  81 mg Oral Daily  . donepezil  10 mg Oral QHS  . enoxaparin (LOVENOX) injection  40 mg Subcutaneous QHS  . memantine  10 mg Oral BID  . pantoprazole  40 mg Oral Daily  . pregabalin  75 mg Oral BID  . simvastatin  40 mg Oral q1800     acetaminophen, gi cocktail, nitroGLYCERIN, ondansetron (ZOFRAN) IV, polyethylene glycol  Prior to  Admission medications   Medication Sig Start Date End Date Taking? Authorizing Provider  aspirin EC 81 MG tablet Take 81 mg by mouth daily.   Yes Historical Provider, MD  donepezil (ARICEPT) 10 MG tablet Take 1 tablet (10 mg total) by mouth at bedtime. 02/19/15  Yes [name], MD  lidocaine (XYLOCAINE) 5 % ointment Apply 1 application topically as needed. Prescription faxed to [place] 240 Gms, 12 refills 02/19/15  Yes Historical Provider, MD  memantine (NAMENDA) 10 MG tablet Take 1 tablet (10 mg total) by mouth 2 (two) times daily. 02/19/15  Yes [name], MD  omeprazole (PRILOSEC) 40 MG capsule Take 1 capsule (40 mg total) by mouth daily. 12/11/14  Yes [name], MD  polyethylene glycol (MIRALAX / GLYCOLAX) packet Take 17 g by mouth daily as needed (constipation).    Yes Historical Provider, MD  pregabalin (LYRICA) 75 MG capsule Take 1 capsule (75 mg total) by mouth 2 (two) times daily. 02/12/15  Yes [name], MD  simvastatin (ZOCOR) 40 MG tablet TAKE 1 BY MOUTH AT BEDTIME 09/25/14  Yes [name], MD  traMADol (ULTRAM) 50 MG tablet Take 1 tablet (50 mg total) by mouth 2 (two) times daily. 04/21/15  Yes [name], MD  lidocaine-prilocaine (EMLA) cream APPLY 2-3 GRAMS (1 GRAM=1 INCH) TO AFFECTED AREA 4 TIMES DAILY 02/24/15   Historical Provider, MD     Social History   Social History  . Marital Status: Married    Spouse Name: [name]  . Number of Children: 4  . Years of Education: 12   Occupational History  . Retired     [employer]   Social History Main Topics  . Smoking status: Never Smoker   . Smokeless tobacco: Never Used  . Alcohol Use: No  . Drug Use: No  . Sexual Activity: Not on file   Other Topics Concern  . Not on file   Social History Narrative   Lives at home with wife.      Family Status  Relation Status Death Age  . Father Deceased   . Mother Deceased   . Brother Alive   . Brother Deceased   . Sister Alive     Family History  Problem Relation Age of Onset  . Lung cancer Father     smoker  . Throat cancer Father   . Kidney disease Father     nephrectomy & partial nephrectomy  . Breast cancer Mother   . Dementia Mother   . Heart attack Mother 62  . Prostate cancer Brother   . Diabetes Maternal Grandmother   . Heart attack Maternal Grandmother     in 41s  . Heart disease Brother  x 3 (one brother with CAD in the 35s, another age 4s, third one 48s CABG)  . Stroke Neg Hx       ROS:  Full 14 point review of systems complete and found to be negative unless listed above.  Physical Exam: Blood pressure 149/75, pulse 59, temperature 97.9 F (36.6 C), temperature source Oral, resp. rate 15, height 5\' 11"  (1.803 m), weight 197 lb 12.8 oz (89.721 kg), SpO2 98 %.  General: Well developed, well nourished, male in no acute distress Head: Eyes PERRLA, No xanthomas. Normocephalic and atraumatic, oropharynx without edema or exudate.  Lungs: Resp regular and unlabored, CTA. TTP at L anterior chest.  Heart: RRR no s3, s4. Soft murmurs. Neck: No carotid bruits. No lymphadenopathy. No JVD. Abdomen: Bowel sounds present, diffuse abdominal tenderness.  Msk:  No spine or cva tenderness. No weakness, no joint deformities or effusions. Extremities: No clubbing, cyanosis or edema. DP/PT/Radials 2+ and equal bilaterally. Neuro: Alert and oriented X 3. No focal deficits noted. Psych:  Good affect, responds appropriately Skin: No rashes or lesions noted.  Labs:   Lab Results  Component Value Date   WBC 5.6 04/30/2015   HGB 13.9 04/30/2015   HCT 42.4 04/30/2015   MCV 89.8 04/30/2015   PLT 171 04/30/2015   No results for input(s): INR in the last 72 hours.  Recent Labs Lab 04/30/15 1558  NA 144  K 4.2  CL 108  CO2 26  BUN 20  CREATININE 1.56*  CALCIUM 9.3  GLUCOSE 105*   No results found for: MG  Recent Labs  04/30/15 2312 05/01/15 0211  TROPONINI <0.03 <0.03    Recent Labs   04/30/15 1605  TROPIPOC 0.00   No results found for: PROBNP Lab Results  Component Value Date   CHOL 161 06/27/2014   HDL 51.80 06/27/2014   LDLCALC 89 06/27/2014   TRIG 100.0 06/27/2014   No results found for: [facility] LIPASE  Date/Time Value Ref Range Status  12/10/2014 11:43 AM 26.0 11.0 - 59.0 U/L Final   AMYLASE  Date/Time Value Ref Range Status  09/24/2014 03:07 PM 48 27 - 131 U/L Final   TSH  Date/Time Value Ref Range Status  09/24/2014 03:07 PM 2.01 0.35 - 4.50 uIU/mL Final   VITAMIN B-12  Date/Time Value Ref Range Status  09/24/2014 03:07 PM 249 211 - 911 pg/mL Final   Cath 03/02/2012 Procedural Findings: Hemodynamics: AO 157/75 LV 162/12  Coronary angiography: Coronary dominance: right  Left mainstem: No significant disease.   Left anterior descending (LAD): Minor luminal irregularities.  Left circumflex (LCx): Small ramus, large OM1, small AV LCx beyond OM1. Luminal irregularities.   Right coronary artery (RCA): Luminal irregularities.   Left ventriculography: Not done due to CKD.  45 cc contrast used for this study.   Final Conclusions: Mild nonobstructive CAD. ST depression on exercise ECG may be due to microvascular disease. Continue hydration post-cath and home tonight. Followup with [name].    Echo: Pending  ECG:  EKG shows sinus rhythm at rate of 60 bpm nonspecific T-wave abnormality in l, AVL and V4 which appears somewhat similar to prior EKG. EKG shows new T-wave inversion in lead V5 and V6 this morning.  Radiology:  Dg Chest 2 View  04/30/2015  CLINICAL DATA:  Pt c/o left sided sharp chest pains x today. Pain does not radiate. Pt denies any other chest complaints. Pt states his wife is an inpatient in the hospital so the pain may be stress  induced. Hx cardiac cath. EXAM: CHEST  2 VIEW COMPARISON:  02/11/2011 FINDINGS: Normal mediastinum and cardiac silhouette. Normal pulmonary vasculature. No evidence of effusion, infiltrate,  or pneumothorax. No acute bony abnormality. Degenerative osteophytosis of the thoracic spine. IMPRESSION: No acute cardiopulmonary process. Electronically Signed   By: Suzy Bouchard M.D.   On: 04/30/2015 16:01    ASSESSMENT AND PLAN:     1. Chest pain - His chest pain seems atypical, however responsive to nitroglycerin. Troponin negative.  EKG shows sinus rhythm at rate of 60 bpm nonspecific T-wave abnormality in l, AVL and V4 which appears somewhat similar to prior EKG. EKG shows new T-wave inversion in lead V5 and V6 this morning. The patient did have a hx of  2 mm horizontal ST changes in the lateral leads during stress test 02/2011.  - Differential include musculoskeletal (the pain is reproducible with palpation) vs GI etiology (chornic abdominal pain, hx of hiatal hernia/GERD/pancreatic benign cyst/Schatzki's ring) vs stress related (wife is admitted currently with stroke).  - Pending echo. Will do stress test, If on ischemia and normal echo --> the patient can be discharge.  2. Sinus arrhthymias - Telemetry shows rate mostly in 50s, however intermittently goes to 120s. Seems PACs. Will review with M.D.  3. Soft murmur - He has normal gradient on cath. Pending echo for further evaluation.   4. Mild non obstructive CAD on cath 02/2011  5. HL - 06/27/2014: Cholesterol 161; HDL 51.80; LDL Cholesterol 89; Triglycerides 100.0; VLDL 20.0. Continue statin  6. Elevated blood pressure - PMH indicates that he has hypertension, however not on any regimen. BP is minimally elevated during admission.      Moderate dementia without behavioral disturbance   CKD (chronic kidney disease) stage 3, GFR 30-59 ml/min   Peripheral neuropathy (HCC)   SignedLeanor Kail, PA 05/01/2015, 8:44 AM Pager [phone]  Co-Sign MD

## 2015-05-01 NOTE — Discharge Summary (Signed)
[facility]  Physician Discharge Summary   Patient ID: Matthew [name] MRN: [id number] DOB/AGE: 1930-10-24 82 y.o.  Admit date: 04/30/2015 Discharge date: 05/01/2015  PCP: [name], MD  DISCHARGE DIAGNOSES:  Principal Problem:   Chest pain Active Problems:   Hyperlipidemia   Moderate dementia without behavioral disturbance   CKD (chronic kidney disease) stage 3, GFR 30-59 ml/min   Peripheral neuropathy (HCC)   Essential hypertension   RECOMMENDATIONS FOR OUTPATIENT FOLLOW UP: 1. Echocardiogram to be pursued as an outpatient   DISCHARGE CONDITION: fair  Diet recommendation: As before  [facility] Weights   04/30/15 2201 05/01/15 0440  Weight: 89.495 kg (197 lb 4.8 oz) 89.721 kg (197 lb 12.8 oz)    INITIAL HISTORY: 82 year old Caucasian male with a past medical history of Alzheimer's disease, peripheral neuropathy, hyperlipidemia, presented with exertional chest pain which was relieved with nitroglycerin. Patient was hospitalized for further management.  Consultations:  Cardiology  Procedures: Nuclear stress test  Defect 1: There is a medium defect of mild severity present in the basal inferoseptal, basal inferior, basal inferolateral and mid inferior location. This defect is consistent with diaphragmatic attenuation.  This is a low risk study. Normal EF.  Prior cardiac cath in 2014 with mild luminal irregularities.  There was no ST segment deviation noted during stress.   HOSPITAL COURSE:   Chest pain Etiology unclear. It was exertional pain and was relieved with nitroglycerin. Patient's records were reviewed. He underwent a cardiac catheterization in 2014, which did not reveal significant CAD. Asian ruled out for acute, recent road. Patient was seen by cardiology. He underwent a nuclear stress test. Results as above. This was a low-risk study. Results communicated to the patient's daughter. No changes to his medications. Patient also was noted to have  systolic murmur in the aortic area. Echocardiogram was ordered, however, it cannot be done until tomorrow. Family does not want patient to stay another night. It is reasonable to pursue echocardiogram as outpatient.   History of Alzheimer's dementia Continue home medications.   History of chronic disease, unknown stage Stable. Continue to monitor urine output.  Neuropathy Continue Lyrica  Overall, stable. Okay for discharge home with family.   PERTINENT LABS:  The results of significant diagnostics from this hospitalization (including imaging, microbiology, ancillary and laboratory) are listed below for reference.      Labs: Basic Metabolic Panel:  Recent Labs Lab 04/30/15 1558  NA 144  K 4.2  CL 108  CO2 26  GLUCOSE 105*  BUN 20  CREATININE 1.56*  CALCIUM 9.3   CBC:  Recent Labs Lab 04/30/15 1558  WBC 5.6  HGB 13.9  HCT 42.4  MCV 89.8  PLT 171   Cardiac Enzymes:  Recent Labs Lab 04/30/15 2312 05/01/15 0211  TROPONINI <0.03 <0.03    IMAGING STUDIES Dg Chest 2 View  04/30/2015  CLINICAL DATA:  Pt c/o left sided sharp chest pains x today. Pain does not radiate. Pt denies any other chest complaints. Pt states his wife is an inpatient in the hospital so the pain may be stress induced. Hx cardiac cath. EXAM: CHEST  2 VIEW COMPARISON:  02/11/2011 FINDINGS: Normal mediastinum and cardiac silhouette. Normal pulmonary vasculature. No evidence of effusion, infiltrate, or pneumothorax. No acute bony abnormality. Degenerative osteophytosis of the thoracic spine. IMPRESSION: No acute cardiopulmonary process. Electronically Signed   By: Suzy Bouchard M.D.   On: 04/30/2015 16:01   Nm Myocar Multi W/spect W/wall Motion / Ef  05/01/2015   Defect  1: There is a medium defect of mild severity present in the basal inferoseptal, basal inferior, basal inferolateral and mid inferior location. This defect is consistent with diaphragmatic attenuation.  This is a low risk study.  Normal EF.  Prior cardiac cath in 2014 with mild luminal irregularities.  There was no ST segment deviation noted during stress.  Candee Furbish, MD   DISPOSITION: Home  Discharge Instructions    Call MD for:  difficulty breathing, headache or visual disturbances    Complete by:  As directed      Call MD for:  extreme fatigue    Complete by:  As directed      Call MD for:  persistant dizziness or light-headedness    Complete by:  As directed      Call MD for:  persistant nausea and vomiting    Complete by:  As directed      Call MD for:  severe uncontrolled pain    Complete by:  As directed      Call MD for:  temperature >100.4    Complete by:  As directed      Diet - low sodium heart healthy    Complete by:  As directed      Discharge instructions    Complete by:  As directed   Echocardiogram to be pursued as outpatient. Please call your primary care physician if you don't hear anything about an echocardiogram by mid next week.  You were cared for by a hospitalist during your hospital stay. If you have any questions about your discharge medications or the care you received while you were in the hospital after you are discharged, you can call the unit and asked to speak with the hospitalist on call if the hospitalist that took care of you is not available. Once you are discharged, your primary care physician will handle any further medical issues. Please note that NO REFILLS for any discharge medications will be authorized once you are discharged, as it is imperative that you return to your primary care physician (or establish a relationship with a primary care physician if you do not have one) for your aftercare needs so that they can reassess your need for medications and monitor your lab values. If you do not have a primary care physician, you can call [phone] for a physician referral.     Increase activity slowly    Complete by:  As directed            ALLERGIES: No Known  Allergies   Current Discharge Medication List    CONTINUE these medications which have NOT CHANGED   Details  aspirin EC 81 MG tablet Take 81 mg by mouth daily.    donepezil (ARICEPT) 10 MG tablet Take 1 tablet (10 mg total) by mouth at bedtime. Qty: 30 tablet, Refills: 12    lidocaine (XYLOCAINE) 5 % ointment Apply 1 application topically as needed. Prescription faxed to [facility], 12 refills    memantine (NAMENDA) 10 MG tablet Take 1 tablet (10 mg total) by mouth 2 (two) times daily. Qty: 60 tablet, Refills: 12    omeprazole (PRILOSEC) 40 MG capsule Take 1 capsule (40 mg total) by mouth daily. Qty: 90 capsule, Refills: 3    polyethylene glycol (MIRALAX / GLYCOLAX) packet Take 17 g by mouth daily as needed (constipation).     pregabalin (LYRICA) 75 MG capsule Take 1 capsule (75 mg total) by mouth 2 (two) times daily. Qty: 60  capsule, Refills: 3    simvastatin (ZOCOR) 40 MG tablet TAKE 1 BY MOUTH AT BEDTIME Qty: 90 tablet, Refills: 3    traMADol (ULTRAM) 50 MG tablet Take 1 tablet (50 mg total) by mouth 2 (two) times daily. Qty: 60 tablet, Refills: 0    lidocaine-prilocaine (EMLA) cream APPLY 2-3 GRAMS (1 GRAM=1 INCH) TO AFFECTED AREA 4 TIMES DAILY Refills: 12       Follow-up Information    Follow up with [name], MD. Schedule an appointment as soon as possible for a visit in 1 week.   Specialty:  Internal Medicine   Why:  post hospitalization follow up   Contact information:   [place] [address] [phone]       TOTAL DISCHARGE TIME: 5 mins  [facility] Pager [phone]  05/01/2015, 3:40 PM

## 2015-05-01 NOTE — Progress Notes (Signed)
[facility] PROGRESS NOTE  Matthew [name] [id number] DOB: Dec 02, 1930 DOA: 04/30/2015  PCP: [name], MD  Brief HPI: 82 year old Caucasian male with a past medical history of Alzheimer's disease, peripheral neuropathy, hyperlipidemia, presented with exertional chest pain which was relieved with nitroglycerin. Patient was hospitalized for further management.  Past medical history:  Past Medical History  Diagnosis Date  . Arthritis   . Anxiety   . Hypertension   . Hyperlipidemia   . Chronic kidney disease   . Prostate cancer [facility])     Dr [name]  . Cataract     NOS  . Impotence of organic origin   . PVD (peripheral vascular disease) ([place])   . Metabolic syndrome X   . Dysphagia     PMH of  . Diverticulosis of colon   . Hemorrhoids, internal   . Hiatal hernia   . Skin cancer   . History of colon polyps 2005    hyperplastic; Dr [name]  . Schatzki's ring   . Alzheimer's dementia     "don't know what stage but it's not early" (04/30/2015)  . CAD (coronary artery disease)     a.  mild non obstructive CAD on cath 02/2011    Consultants: Cardiology  Procedures: Stress test is planned for today  Transthoracic echocardiogram is pending  Antibiotics: None  Subjective: Patient denies any chest pain currently. He is pleasantly confused. His daughter is at the bedside. No nausea or vomiting. Patient tells me that he has had occasional abdominal pain which has been ongoing for many months.  Objective:  Vital Signs  Filed Vitals:   04/30/15 2355 05/01/15 0440 05/01/15 0745 05/01/15 1226  BP: 134/68 106/76 149/75 149/76  Pulse: 62 60 59   Temp: 98 F (36.7 C) 98.2 F (36.8 C) 97.9 F (36.6 C)   TempSrc: Oral Oral Oral   Resp: 16 16 15    Height:      Weight:  89.721 kg (197 lb 12.8 oz)    SpO2: 100% 99% 98%     Intake/Output Summary (Last 24 hours) at 05/01/15 1227 Last data filed at 05/01/15 0745  Gross per 24 hour  Intake    240 ml  Output     400 ml  Net   -160 ml   Filed Weights   04/30/15 2201 05/01/15 0440  Weight: 89.495 kg (197 lb 4.8 oz) 89.721 kg (197 lb 12.8 oz)    General appearance: alert, appears stated age, distracted and no distress Resp: clear to auscultation bilaterally Cardio: S1, S2 is normal, regular. Systolic murmur appreciated over the aortic area. No S3, S4. No rubs, or bruit. GI: soft, non-tender; bowel sounds normal; no masses,  no organomegaly Extremities: extremities normal, atraumatic, no cyanosis or edema Neurologic: Awake and alert. Pleasantly confused. No focal neurological deficits.  Lab Results:  Data Reviewed: I have personally reviewed following labs and imaging studies  CBC:  Recent Labs Lab 04/30/15 1558  WBC 5.6  HGB 13.9  HCT 42.4  MCV 89.8  PLT [id number]   Basic Metabolic Panel:  Recent Labs Lab 04/30/15 1558  NA 144  K 4.2  CL 108  CO2 26  GLUCOSE 105*  BUN 20  CREATININE 1.56*  CALCIUM 9.3   Cardiac Enzymes:  Recent Labs Lab 04/30/15 2312 05/01/15 0211  TROPONINI <0.03 <0.03     Radiology Studies: Dg Chest 2 View  04/30/2015  CLINICAL DATA:  Pt c/o left sided sharp chest pains x today. Pain does  not radiate. Pt denies any other chest complaints. Pt states his wife is an inpatient in the hospital so the pain may be stress induced. Hx cardiac cath. EXAM: CHEST  2 VIEW COMPARISON:  02/11/2011 FINDINGS: Normal mediastinum and cardiac silhouette. Normal pulmonary vasculature. No evidence of effusion, infiltrate, or pneumothorax. No acute bony abnormality. Degenerative osteophytosis of the thoracic spine. IMPRESSION: No acute cardiopulmonary process. Electronically Signed   By: Suzy Bouchard M.D.   On: 04/30/2015 16:01     Medications:  Scheduled: . aspirin EC  81 mg Oral Daily  . donepezil  10 mg Oral QHS  . enoxaparin (LOVENOX) injection  40 mg Subcutaneous QHS  . memantine  10 mg Oral BID  . pantoprazole  40 mg Oral Daily  . pregabalin  75 mg Oral BID  .  regadenoson      . simvastatin  40 mg Oral q1800   Continuous:  [id number], gi cocktail, LORazepam, nitroGLYCERIN, ondansetron (ZOFRAN) IV, polyethylene glycol, technetium sestamibi generic  Assessment/Plan:  Principal Problem:   Chest pain Active Problems:   Hyperlipidemia   Moderate dementia without behavioral disturbance   CKD (chronic kidney disease) stage 3, GFR 30-59 ml/min   Peripheral neuropathy (HCC)   Essential hypertension    Chest pain Etiology unclear. It was exertional pain and was relieved with nitroglycerin. Patient's records were reviewed. He underwent a cardiac catheterization in 2014, which did not reveal significant CAD. Patient has been seen by cardiology. He will undergo nuclear stress test today. Since he does have a systolic murmur in the aortic area. We will also obtain an echocardiogram. Continue aspirin for now.  History of Alzheimer's dementia Continue home medications. Some degree of anxiety noted this morning. Lorazepam as needed at low-dose.  History of chronic disease, unknown stage Stable. Continue to monitor urine output.  Neuropathy Continue Lyrica  DVT Prophylaxis: Lovenox    Code Status: Full code  Family Communication: Discussed with the patient and his daughter  Disposition Plan: Await stress test and echocardiogram. Mobilize. Anticipate discharge later today or tomorrow depending on results of testing.      [facility] Pager [phone] 05/01/2015, 12:27 PM  If 7PM-7AM, please contact night-coverage at [email or web address], password [facility]

## 2015-05-01 NOTE — Plan of Care (Signed)
Problem: Education: Goal: Knowledge of [facility] Education information/materials will improve Outcome: Completed/Met Date Met:  05/01/15 Pt educated on safety plan, pain scale, and new/current medications. Pt verbalized understanding. Plan of care reviewed with pt and family.

## 2015-05-01 NOTE — Discharge Instructions (Signed)

## 2015-05-01 NOTE — Progress Notes (Signed)
     The patient was seen in nuclear medicine for a lexiscan myoview. He tolerated the procedure well. No acute ST or TW changes on ECG. Await nuclear images.    Harlee Pursifull Stern PA-C  [facility]    

## 2015-05-01 NOTE — Care Management Obs Status (Signed)
[place] NOTIFICATION   Patient Details  Name: DYONTE COSGROVE MRN: [id number] Date of Birth: [birth year]   Medicare Observation Status Notification Given:  Yes (chest pain)    Bethena Roys, RN 05/01/2015, 4:04 PM

## 2015-05-12 ENCOUNTER — Encounter: Payer: Self-pay | Admitting: Internal Medicine

## 2015-05-12 ENCOUNTER — Ambulatory Visit (INDEPENDENT_AMBULATORY_CARE_PROVIDER_SITE_OTHER): Payer: Medicare Other | Admitting: Internal Medicine

## 2015-05-12 VITALS — BP 140/84 | HR 60 | Temp 98.2°F | Resp 16 | Wt 201.0 lb

## 2015-05-12 DIAGNOSIS — I10 Essential (primary) hypertension: Secondary | ICD-10-CM | POA: Diagnosis not present

## 2015-05-12 DIAGNOSIS — I350 Nonrheumatic aortic (valve) stenosis: Secondary | ICD-10-CM

## 2015-05-12 DIAGNOSIS — R072 Precordial pain: Secondary | ICD-10-CM

## 2015-05-12 DIAGNOSIS — K5900 Constipation, unspecified: Secondary | ICD-10-CM | POA: Diagnosis not present

## 2015-05-12 DIAGNOSIS — R109 Unspecified abdominal pain: Secondary | ICD-10-CM | POA: Insufficient documentation

## 2015-05-12 NOTE — Patient Instructions (Addendum)
Start taking the stool softener daily.  Adjust as needed for regular bowel movements.   Follow up with your urologist.    Medications reviewed and updated.  No changes recommended at this time.   Please followup in 6 months

## 2015-05-12 NOTE — Assessment & Plan Note (Signed)
He has been having abdominal pain for a few months without obvious cause Has seen GI Is following with urology, but has not discussed the abdominal pain without CT of the abdomen and pelvis done in December 2016-no cause identified He has diffuse mild abdominal pain that may be related to his constipation. He is taking a stool softener as needed, but will start taking it regularly to prevent constipation-and adjust dose as needed The area or concerning pain is around his umbilicus surgical scar from his prostate surgery-no obvious hernia, but that may be the cause of the pain. No hernia seen on the CT scan The pain may also be related to his bladder and he will discuss this with his urologist when he sees them Reassure them that I do not think that he has colon cancer and had a colonoscopy is not necessarily warranted

## 2015-05-12 NOTE — Assessment & Plan Note (Signed)
Blood pressure slightly elevated here today, but typically normal Currently not on any medication-we'll continue to monitor

## 2015-05-12 NOTE — Progress Notes (Signed)
Subjective:    Patient ID: Matthew [name], male    DOB: [birth year], 82 y.o.   MRN: [id number]  HPI He is here for hospital follow up.  He went to the ED for chest pain on 4/20 and was discharged on 4/21.  He was walking to car with his daughter and stopped due to chest pain that was associated with sob.  The pain was in the left chest and did not radiate.  He did not have nausea or sweating.  He was taken to the ED and the pain improved after NLG, but did not resolve completely.  He did have some prior chest pain on the left side for several days but not this severe.  He does have some dementia and is a poor historian.  His EKG did not show any concerning changes.  CXR normal.    Nuclear stress test:  Defect 1: There is a medium defect of mild severity present in the basal inferoseptal, basal inferior, basal inferolateral and mid inferior location. This defect is consistent with diaphragmatic attenuation.  This is a low risk study. Normal EF.  Prior cardiac cath in 2014 with mild luminal irregularities.  There was no ST segment deviation noted during stress.  ECHO:  - Normal LV size with EF 60-65%. Normal RV size and systolic  function. Mild aortic stenosis.   He had a cardiac cath in 2014 which showed no significant CAD.  There was no change in medications.  He was discharged home. Since being home from the hospital he sometimes has left sided chest pain.  He has a little tingle there.  He does have pain when he pushes on the left chest, and also experiences pain with laughing or movement.    Abdominal pain:  He has chronic abdominal pain that started about 5 months ago. He has been seeing his gastroenterologist. He had a CT of the abdomen and pelvis that revealed no cause. He does have some chronic constipation and has been taking stool softeners as needed. Him and his wife didt want him to have a colonoscopy, but GI did not feel this was necessary. They're concerned that he may have  a tumor. He is following with urology for urinary incontinence and frequency. The incontinence and frequency has worsened.     Medications and allergies reviewed with patient and updated if appropriate.  Patient Active Problem List   Diagnosis Date Noted  . Essential hypertension   . Chest pain 04/30/2015  . CKD (chronic kidney disease) stage 3, GFR 30-59 ml/min 04/30/2015  . Peripheral neuropathy ([place]) 04/30/2015  . Pain in the chest   . Pancreatic cyst 02/12/2015  . Hereditary and idiopathic peripheral neuropathy 11/07/2014  . Elevated serum creatinine 02/04/2013  . Abnormal EKG 02/16/2012  . Moderate dementia without behavioral disturbance 11/02/2009  . SKIN CANCER, HX OF 11/02/2009  . ANXIETY 02/03/2007  . HEMORRHOIDS, INTERNAL 02/03/2007  . ARTHRITIS 02/03/2007  . SLEEP APNEA 02/03/2007  . Hyperlipidemia 11/13/2006  . METABOLIC SYNDROME X [id number]  . PVD 11/13/2006  . PROSTATE CANCER, HX OF 11/13/2006  . KIDNEY DISEASE, CHRONIC NOS 03/27/2006  . 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 OSTEOARTHROSIS NOS, UNSPECIFIED SITE 03/27/2006  . COLONIC POLYPS, HYPERPLASTIC 12/24/2003  . HIATAL HERNIA 12/24/2003  . Diverticulosis of large intestine 12/24/2003    Current Outpatient Prescriptions on File Prior to Visit  Medication Sig Dispense Refill  . aspirin EC 81 MG tablet Take 81 mg by mouth daily.    [name]  donepezil (ARICEPT) 10 MG tablet Take 1 tablet (10 mg total) by mouth at bedtime. 30 tablet 12  . lidocaine (XYLOCAINE) 5 % ointment Apply 1 application topically as needed. Prescription faxed to [facility], 12 refills    . lidocaine-prilocaine (EMLA) cream APPLY 2-3 GRAMS (1 GRAM=1 INCH) TO AFFECTED AREA 4 TIMES DAILY  12  . memantine (NAMENDA) 10 MG tablet Take 1 tablet (10 mg total) by mouth 2 (two) times daily. 60 tablet 12  . omeprazole (PRILOSEC) 40 MG capsule Take 1 capsule (40 mg total) by mouth daily. 90 capsule 3  . polyethylene glycol (MIRALAX /  GLYCOLAX) packet Take 17 g by mouth daily as needed (constipation).     . pregabalin (LYRICA) 75 MG capsule Take 1 capsule (75 mg total) by mouth 2 (two) times daily. 60 capsule 3  . simvastatin (ZOCOR) 40 MG tablet TAKE 1 BY MOUTH AT BEDTIME 90 tablet 3  . traMADol (ULTRAM) 50 MG tablet Take 1 tablet (50 mg total) by mouth 2 (two) times daily. 60 tablet 0   No current facility-administered medications on file prior to visit.    Past Medical History  Diagnosis Date  . Arthritis   . Anxiety   . Hypertension   . Hyperlipidemia   . Chronic kidney disease   . Prostate cancer [facility])     Dr [name]  . Cataract     NOS  . Impotence of organic origin   . PVD (peripheral vascular disease) ([place])   . Metabolic syndrome X   . Dysphagia     PMH of  . Diverticulosis of colon   . Hemorrhoids, internal   . Hiatal hernia   . Skin cancer   . History of colon polyps 2005    hyperplastic; Dr [name]  . Schatzki's ring   . Alzheimer's dementia     "don't know what stage but it's not early" (04/30/2015)  . CAD (coronary artery disease)     a.  mild non obstructive CAD on cath 02/2011    Past Surgical History  Procedure Laterality Date  . Appendectomy  1950  . Cervical fusion    . Colonoscopy w/ polypectomy  2005    Dr [name]  . Prostatectomy  1995    Dr.[name]; no chemo or radiation  . Tonsillectomy    . Shoulder surgery      right  . Left heart catheterization with coronary angiogram N/A 03/02/2012    Procedure: LEFT HEART CATHETERIZATION WITH CORONARY ANGIOGRAM;  Surgeon: [name], MD;  Location: [facility] CATH LAB;  Service: Cardiovascular;  Laterality: N/A;  . Eus N/A 01/15/2015    Procedure: UPPER ENDOSCOPIC ULTRASOUND (EUS) RADIAL;  Surgeon: [name], MD;  Location: [facility] ENDOSCOPY;  Service: Endoscopy;  Laterality: N/A;  . Back surgery      Social History   Social History  . Marital Status: Married    Spouse Name: [name]  . Number of Children: 4  . Years of  Education: 12   Occupational History  . Retired     [employer]   Social History Main Topics  . Smoking status: Never Smoker   . Smokeless tobacco: Never Used  . Alcohol Use: No  . Drug Use: No  . Sexual Activity: Not Asked   Other Topics Concern  . None   Social History Narrative   Lives at home with wife.      Family History  Problem Relation Age of Onset  .  Lung cancer Father     smoker  . Throat cancer Father   . Kidney disease Father     nephrectomy & partial nephrectomy  . Breast cancer Mother   . Dementia Mother   . Heart attack Mother 52  . Prostate cancer Brother   . Diabetes Maternal Grandmother   . Heart attack Maternal Grandmother     in 78s  . Heart disease Brother     x 3 (one brother with CAD in the 41s, another age 15s, third one 66s CABG)  . Stroke Neg Hx     Review of Systems  Constitutional: Negative for fever and appetite change.  Respiratory: Negative for cough, shortness of breath and wheezing.   Cardiovascular: Positive for chest pain (painful to touch). Negative for palpitations and leg swelling.  Gastrointestinal: Positive for nausea (with abdominal pain), abdominal pain (constant, worse with breathing, bending over) and constipation (controlled docolace). Negative for diarrhea and blood in stool.  Genitourinary: Positive for dysuria (last night only, none today) and frequency. Negative for hematuria.       Objective:   Filed Vitals:   05/12/15 1447  BP: 140/84  Pulse: 60  Temp: 98.2 F (36.8 C)  Resp: 16   Filed Weights   05/12/15 1447  Weight: 201 lb (91.173 kg)   Body mass index is 28.05 kg/(m^2).   Physical Exam Constitutional: Appears well-developed and well-nourished. No distress.  Neck: Neck supple. No tracheal deviation present. No thyromegaly present.  No carotid bruit. No cervical adenopathy.   Cardiovascular: Normal rate, regular rhythm and normal heart sounds.   2/6 RUSB murmur heard.  No  edema Chest: Tenderness with palpation left sided chest Pulmonary/Chest: Effort normal and breath sounds normal. No respiratory distress. No wheezes.  Abdomen: scar from prior prostate surgery, tenderness along scar and in umbilicus, no obvious hernia, mild diffuse abdominal pain without rebound or guarding, no mass       Assessment & Plan:    See Problem List for Assessment and Plan of chronic medical problems.  Follow up in 6 months

## 2015-05-12 NOTE — Assessment & Plan Note (Signed)
Taking a stool softener as needed only Start stool softener daily and adjust dose as needed-this may be contributing to some of his abdominal pain/discomfort

## 2015-05-12 NOTE — Progress Notes (Signed)
Pre visit review using our clinic review tool, if applicable. No additional management support is needed unless otherwise documented below in the visit note. 

## 2015-05-12 NOTE — Assessment & Plan Note (Signed)
Has had some episodes of left-sided chest pain. Workup in the hospital including stress test, EKG, blood work and echocardiogram were all negative. Catheterization 2014 showed no significant heart disease Today on exam his pain is musculoskeletal in nature No further testing needed-just monitor

## 2015-05-18 ENCOUNTER — Telehealth: Payer: Self-pay | Admitting: Internal Medicine

## 2015-05-18 ENCOUNTER — Emergency Department (HOSPITAL_COMMUNITY): Payer: Medicare Other

## 2015-05-18 ENCOUNTER — Other Ambulatory Visit: Payer: Self-pay

## 2015-05-18 ENCOUNTER — Encounter (HOSPITAL_COMMUNITY): Payer: Self-pay | Admitting: *Deleted

## 2015-05-18 ENCOUNTER — Emergency Department (HOSPITAL_COMMUNITY)
Admission: EM | Admit: 2015-05-18 | Discharge: 2015-05-18 | Disposition: A | Discharge status: Home / Self Care | Payer: Medicare Other | Attending: Emergency Medicine | Admitting: Emergency Medicine

## 2015-05-18 DIAGNOSIS — E785 Hyperlipidemia, unspecified: Secondary | ICD-10-CM | POA: Diagnosis not present

## 2015-05-18 DIAGNOSIS — R0789 Other chest pain: Secondary | ICD-10-CM | POA: Insufficient documentation

## 2015-05-18 DIAGNOSIS — R079 Chest pain, unspecified: Secondary | ICD-10-CM | POA: Diagnosis not present

## 2015-05-18 DIAGNOSIS — Z79899 Other long term (current) drug therapy: Secondary | ICD-10-CM | POA: Diagnosis not present

## 2015-05-18 DIAGNOSIS — Q394 Esophageal web: Secondary | ICD-10-CM | POA: Insufficient documentation

## 2015-05-18 DIAGNOSIS — N189 Chronic kidney disease, unspecified: Secondary | ICD-10-CM | POA: Insufficient documentation

## 2015-05-18 DIAGNOSIS — Z8719 Personal history of other diseases of the digestive system: Secondary | ICD-10-CM | POA: Diagnosis not present

## 2015-05-18 DIAGNOSIS — Z86010 Personal history of colonic polyps: Secondary | ICD-10-CM | POA: Insufficient documentation

## 2015-05-18 DIAGNOSIS — Z8546 Personal history of malignant neoplasm of prostate: Secondary | ICD-10-CM | POA: Diagnosis not present

## 2015-05-18 DIAGNOSIS — F0280 Dementia in other diseases classified elsewhere without behavioral disturbance: Secondary | ICD-10-CM | POA: Insufficient documentation

## 2015-05-18 DIAGNOSIS — M1990 Unspecified osteoarthritis, unspecified site: Secondary | ICD-10-CM | POA: Insufficient documentation

## 2015-05-18 DIAGNOSIS — Z9889 Other specified postprocedural states: Secondary | ICD-10-CM | POA: Insufficient documentation

## 2015-05-18 DIAGNOSIS — F419 Anxiety disorder, unspecified: Secondary | ICD-10-CM | POA: Diagnosis not present

## 2015-05-18 DIAGNOSIS — I129 Hypertensive chronic kidney disease with stage 1 through stage 4 chronic kidney disease, or unspecified chronic kidney disease: Secondary | ICD-10-CM | POA: Diagnosis not present

## 2015-05-18 DIAGNOSIS — G309 Alzheimer's disease, unspecified: Secondary | ICD-10-CM | POA: Insufficient documentation

## 2015-05-18 DIAGNOSIS — I2510 Atherosclerotic heart disease of native coronary artery without angina pectoris: Secondary | ICD-10-CM | POA: Insufficient documentation

## 2015-05-18 DIAGNOSIS — Z7982 Long term (current) use of aspirin: Secondary | ICD-10-CM | POA: Diagnosis not present

## 2015-05-18 LAB — BASIC METABOLIC PANEL
ANION GAP: 9 (ref 5–15)
BUN: 23 mg/dL — AB (ref 6–20)
CALCIUM: 9 mg/dL (ref 8.9–10.3)
CO2: 28 mmol/L (ref 22–32)
Chloride: 105 mmol/L (ref 101–111)
Creatinine, Ser: 1.51 mg/dL — ABNORMAL HIGH (ref 0.61–1.24)
GFR calc Af Amer: 47 mL/min — ABNORMAL LOW (ref >60)
GFR, EST NON AFRICAN AMERICAN: 41 mL/min — AB (ref >60)
Glucose, Bld: 95 mg/dL (ref 65–99)
Potassium: 4.9 mmol/L (ref 3.5–5.1)
Sodium: 142 mmol/L (ref 135–145)

## 2015-05-18 LAB — I-STAT TROPONIN, ED
TROPONIN I, POC: 0 ng/mL (ref 0.00–0.08)
Troponin i, poc: 0 ng/mL (ref 0.00–0.08)

## 2015-05-18 LAB — CBC
HCT: 44.6 % (ref 39.0–52.0)
HEMOGLOBIN: 14.5 g/dL (ref 13.0–17.0)
MCH: 29.9 pg (ref 26.0–34.0)
MCHC: 32.5 g/dL (ref 30.0–36.0)
MCV: 92 fL (ref 78.0–100.0)
Platelets: 154 10*3/uL (ref 150–400)
RBC: 4.85 MIL/uL (ref 4.22–5.81)
RDW: 13.2 % (ref 11.5–15.5)
WBC: 5.2 10*3/uL (ref 4.0–10.5)

## 2015-05-18 MED ORDER — CYCLOBENZAPRINE HCL 10 MG PO TABS
5.0000 mg | ORAL_TABLET | Freq: Once | ORAL | Status: AC
  Administered 2015-05-18: 5 mg via ORAL
  Filled 2015-05-18: qty 1

## 2015-05-18 MED ORDER — CYCLOBENZAPRINE HCL 5 MG PO TABS: 5.0000 mg | ORAL_TABLET | Freq: Three times a day (TID) | ORAL | Status: DC | PRN

## 2015-05-18 MED ORDER — ACETAMINOPHEN 500 MG PO TABS
1000.0000 mg | ORAL_TABLET | Freq: Once | ORAL | Status: AC
  Administered 2015-05-18: 1000 mg via ORAL
  Filled 2015-05-18: qty 2

## 2015-05-18 NOTE — ED Provider Notes (Signed)
CSN: [id number]     Arrival date & time 05/18/15  1442 History   First MD Initiated Contact with Patient 05/18/15 2129     Chief Complaint  Patient presents with  . Chest Pain  . Shoulder Pain     (Consider location/radiation/quality/duration/timing/severity/associated sxs/prior Treatment) HPI   This is an 82 year old male brought in by his family for chief complaint of left-sided chest pain. He has a past medical history of Alzheimer's dementia, history of vascular disease. 5 caveat due to dementia. History is given by the patient's wife. She states that today he went out to the mailbox and when he came in. He sat down. She states that suddenly began complaining of pain on the left side of his chest. He is not short of breath, sweaty or nauseous. The patient was admitted about a week ago for chest pain and had a negative cardiac workup.  He states that his pain is worse with movement of the arm. No recent illnesses, fevers, chills, or upper respiratory symptoms.  Past Medical History  Diagnosis Date  . Arthritis   . Anxiety   . Hypertension   . Hyperlipidemia   . Chronic kidney disease   . Prostate cancer [facility])     Dr [name]  . Cataract     NOS  . Impotence of organic origin   . PVD (peripheral vascular disease) ([place])   . Metabolic syndrome X   . Dysphagia     PMH of  . Diverticulosis of colon   . Hemorrhoids, internal   . Hiatal hernia   . Skin cancer   . History of colon polyps 2005    hyperplastic; Dr [name]  . Schatzki's ring   . Alzheimer's dementia     "don't know what stage but it's not early" (04/30/2015)  . CAD (coronary artery disease)     a.  mild non obstructive CAD on cath 02/2011   Past Surgical History  Procedure Laterality Date  . Appendectomy  1950  . Cervical fusion    . Colonoscopy w/ polypectomy  2005    Dr [name]  . Prostatectomy  1995    Dr.[name]; no chemo or radiation  . Tonsillectomy    . Shoulder surgery      right  . Left  heart catheterization with coronary angiogram N/A 03/02/2012    Procedure: LEFT HEART CATHETERIZATION WITH CORONARY ANGIOGRAM;  Surgeon: [name], MD;  Location: [facility] CATH LAB;  Service: Cardiovascular;  Laterality: N/A;  . Eus N/A 01/15/2015    Procedure: UPPER ENDOSCOPIC ULTRASOUND (EUS) RADIAL;  Surgeon: [name], MD;  Location: [facility] ENDOSCOPY;  Service: Endoscopy;  Laterality: N/A;  . Back surgery     Family History  Problem Relation Age of Onset  . Lung cancer Father     smoker  . Throat cancer Father   . Kidney disease Father     nephrectomy & partial nephrectomy  . Breast cancer Mother   . Dementia Mother   . Heart attack Mother 49  . Prostate cancer Brother   . Diabetes Maternal Grandmother   . Heart attack Maternal Grandmother     in 79s  . Heart disease Brother     x 3 (one brother with CAD in the 19s, another age 25s, third one 69s CABG)  . Stroke Neg Hx    Social History  Substance Use Topics  . Smoking status: Never Smoker   . Smokeless tobacco: Never Used  . Alcohol Use: No  Review of Systems  Ten systems reviewed and are negative for acute change, except as noted in the HPI.    Allergies  Review of patient's allergies indicates no known allergies.  Home Medications   Prior to Admission medications   Medication Sig Start Date End Date Taking? Authorizing Provider  aspirin EC 81 MG tablet Take 81 mg by mouth daily.    Historical Provider, MD  donepezil (ARICEPT) 10 MG tablet Take 1 tablet (10 mg total) by mouth at bedtime. 02/19/15   [name], MD  lidocaine (XYLOCAINE) 5 % ointment Apply 1 application topically as needed. Prescription faxed to [place] [address], 12 refills 02/19/15   Historical Provider, MD  lidocaine-prilocaine (EMLA) cream APPLY 2-3 GRAMS (1 GRAM=1 INCH) TO AFFECTED AREA 4 TIMES DAILY 02/24/15   Historical Provider, MD  memantine (NAMENDA) 10 MG tablet Take 1 tablet (10 mg total) by mouth 2 (two) times daily. 02/19/15    [name], MD  omeprazole (PRILOSEC) 40 MG capsule Take 1 capsule (40 mg total) by mouth daily. 12/11/14   [name], MD  polyethylene glycol (MIRALAX / Floria Raveling) packet Take 17 g by mouth daily as needed (constipation).     Historical Provider, MD  pregabalin (LYRICA) 75 MG capsule Take 1 capsule (75 mg total) by mouth 2 (two) times daily. 02/12/15   [name], MD  simvastatin (ZOCOR) 40 MG tablet TAKE 1 BY MOUTH AT BEDTIME 09/25/14   [name], MD  traMADol (ULTRAM) 50 MG tablet Take 1 tablet (50 mg total) by mouth 2 (two) times daily. 04/21/15   [name], MD   BP 184/86 mmHg  Pulse 59  Temp(Src) 97.6 F (36.4 C) (Oral)  Resp 11  SpO2 95% Physical Exam  Constitutional: He appears well-developed and well-nourished. No distress.  HENT:  Head: Normocephalic and atraumatic.  Eyes: Conjunctivae are normal. No scleral icterus.  Neck: Normal range of motion. Neck supple.  Cardiovascular: Normal rate, regular rhythm and normal heart sounds.   Pulmonary/Chest: Effort normal and breath sounds normal. No respiratory distress. He has no wheezes. He exhibits tenderness.    Tender to palpation in the left upper pectoralis region.  Abdominal: Soft. He exhibits no distension. There is no tenderness. There is no guarding.  Musculoskeletal: He exhibits no edema.  Neurological: He is alert.  Skin: Skin is warm and dry. He is not diaphoretic.  Psychiatric: His behavior is normal.  Nursing note and vitals reviewed.   ED Course  Procedures (including critical care time) Labs Review Labs Reviewed  BASIC METABOLIC PANEL - Abnormal; Notable for the following:    BUN 23 (*)    Creatinine, Ser 1.51 (*)    GFR calc non Af Amer 41 (*)    GFR calc Af Amer 47 (*)    All other components within normal limits  CBC  I-STAT TROPOININ, ED  Randolm Idol, ED    Imaging Review Dg Chest 2 View  05/18/2015  CLINICAL DATA:  Left upper chest pain for 3 days. EXAM: CHEST  2 VIEW  COMPARISON:  04/30/2015 FINDINGS: Evidence for ECG skin markers in the left lower chest. Lungs are clear without airspace disease or pulmonary edema. Few atherosclerotic calcifications at the aortic arch are similar to the previous examination. Stable appearance of the heart and mediastinum. Heart size is normal. Bridging osteophytes along the right side of thoracic spine. No large pleural effusions. IMPRESSION: No active cardiopulmonary disease. Electronically Signed   By:  Markus Daft M.D.   On: 05/18/2015 15:40   I have personally reviewed and evaluated these images and lab results as part of my medical decision-making.   EKG Interpretation   Date/Time:  Monday May 18 2015 14:49:40 EDT Ventricular Rate:  67 PR Interval:  138 QRS Duration: 76 QT Interval:  426 QTC Calculation: 450 R Axis:   36 Text Interpretation:  Normal sinus rhythm Nonspecific T wave abnormality  Abnormal ECG No significant change since last tracing Confirmed by YAO   MD, DAVID ([id number]) on 05/18/2015 9:28:39 PM      MDM   Final diagnoses:  None    10:49 PM Patient with reproducible cp. He was admitted one week ago with a negative work up  Seen in shared visit with Dr. Darl Householder. Negative troponins here in the emergency department, recent negative workup in the emergency department. He's been observed in the emergency department for greater than 8 hours without change in his troponin levels. No concern for pulmonary embolus. His pain is reproducible. This is very low risk for major adverse cardiac events. Patient appears safe for discharge at this time.   Margarita Mail, PA-C 05/18/15 2319  Wandra Arthurs, MD 05/18/15 [phone]

## 2015-05-18 NOTE — ED Notes (Signed)
Patient reports onset of chest pain on the left side that radiates into the left shoulder.  Onset this morning when sitting.  He denies any n/v.  States he feels weak and has been sitting around. Patient denies hx of mi.  He is alert.  Skin warm and dry

## 2015-05-18 NOTE — Telephone Encounter (Signed)
Pts wife wanted to know if pt could see Dr. Hilarie Fredrickson. Explained that Dr. Hilarie Fredrickson does not have any appts until July. Pt is scheduled with PA. Wife wanted to know if we could schedule pt for an MRI. Explained that when pt is seen by PA she and Dr. Hilarie Fredrickson will decide if he needs an MRI and go from there. Wife verbalized understanding.

## 2015-05-18 NOTE — Discharge Instructions (Signed)

## 2015-05-19 ENCOUNTER — Ambulatory Visit: Payer: Medicare Other | Admitting: Cardiology

## 2015-05-21 ENCOUNTER — Other Ambulatory Visit: Payer: Self-pay | Admitting: Emergency Medicine

## 2015-05-21 MED ORDER — TRAMADOL HCL 50 MG PO TABS: 50.0000 mg | ORAL_TABLET | Freq: Two times a day (BID) | ORAL | Status: DC

## 2015-05-21 NOTE — Telephone Encounter (Signed)
RX for Tramadol faxed to [facility] 

## 2015-05-26 ENCOUNTER — Encounter: Payer: Self-pay | Admitting: Gastroenterology

## 2015-05-26 ENCOUNTER — Ambulatory Visit (INDEPENDENT_AMBULATORY_CARE_PROVIDER_SITE_OTHER): Payer: Medicare Other | Admitting: Gastroenterology

## 2015-05-26 ENCOUNTER — Ambulatory Visit: Payer: Medicare Other | Admitting: Physician Assistant

## 2015-05-26 VITALS — BP 110/72 | HR 72 | Ht 69.0 in | Wt 201.2 lb

## 2015-05-26 DIAGNOSIS — R110 Nausea: Secondary | ICD-10-CM | POA: Diagnosis not present

## 2015-05-26 DIAGNOSIS — K862 Cyst of pancreas: Secondary | ICD-10-CM

## 2015-05-26 DIAGNOSIS — R1033 Periumbilical pain: Secondary | ICD-10-CM

## 2015-05-26 MED ORDER — ONDANSETRON 4 MG PO TBDP: ORAL_TABLET | ORAL | Status: DC

## 2015-05-26 NOTE — Patient Instructions (Signed)
We sent a prescription for Zofran ODT 4 mg to [facility].    You have been scheduled for a CT scan of the abdomen and pelvis at [facility] ([address]---this is in the same building as [facility]).   You are scheduled on Tuesday 06-02-2015 at 2:00 PM. You should arrive at 1:20 PM to your appointment time for registration. Please follow the written instructions below on the day of your exam:  WARNING: IF YOU ARE ALLERGIC TO IODINE/X-RAY DYE, PLEASE NOTIFY RADIOLOGY IMMEDIATELY AT [phone]! YOU WILL BE GIVEN A 13 HOUR PREMEDICATION PREP.  1) Do not eat  anything after 10:00 am (4 hours prior to your test)  You may take any medications as prescribed with a small amount of water except for the following: Metformin, Glucophage, Glucovance, Avandamet, Riomet, Fortamet, Actoplus Met, Janumet, Glumetza or Metaglip. The above medications must be held the day of the exam AND 48 hours after the exam.  The purpose of you drinking the oral contrast is to aid in the visualization of your intestinal tract. The contrast solution may cause some diarrhea. Before your exam is started, you will be given a small amount of fluid to drink. Depending on your individual set of symptoms, you may also receive an intravenous injection of x-ray contrast/dye. Plan on being at [facility] for 30 minutes or long, depending on the type of exam you are having performed.  If you have any questions regarding your exam or if you need to reschedule, you may call the CT department at [phone] between the hours of 8:00 am and 5:00 pm, Monday-Friday.  ________________________________________________________________________

## 2015-05-27 ENCOUNTER — Telehealth: Payer: Self-pay | Admitting: *Deleted

## 2015-05-27 ENCOUNTER — Other Ambulatory Visit: Payer: Self-pay | Admitting: *Deleted

## 2015-05-27 MED ORDER — ONDANSETRON HCL 4 MG PO TABS: 4.0000 mg | ORAL_TABLET | Freq: Three times a day (TID) | ORAL | Status: DC | PRN

## 2015-05-27 NOTE — Telephone Encounter (Signed)
Advised the patient his insurance didn't want to cover the ODT Zofran tablets ( that you melt on the tongue). I told him per Janett Billow , he can take the regular tablets that you swallow with water. I sent that prescription today to [place].  The patient verbalized understanding of this.

## 2015-06-01 DIAGNOSIS — R110 Nausea: Secondary | ICD-10-CM | POA: Insufficient documentation

## 2015-06-01 DIAGNOSIS — R1033 Periumbilical pain: Secondary | ICD-10-CM | POA: Insufficient documentation

## 2015-06-02 ENCOUNTER — Ambulatory Visit (INDEPENDENT_AMBULATORY_CARE_PROVIDER_SITE_OTHER)
Admission: RE | Admit: 2015-06-02 | Discharge: 2015-06-02 | Disposition: A | Discharge status: Home / Self Care | Payer: Medicare Other | Source: Ambulatory Visit | Attending: Gastroenterology | Admitting: Gastroenterology

## 2015-06-02 DIAGNOSIS — R110 Nausea: Secondary | ICD-10-CM | POA: Diagnosis not present

## 2015-06-02 DIAGNOSIS — R1033 Periumbilical pain: Secondary | ICD-10-CM | POA: Diagnosis not present

## 2015-06-02 DIAGNOSIS — K862 Cyst of pancreas: Secondary | ICD-10-CM

## 2015-06-02 MED ORDER — IOPAMIDOL (ISOVUE-300) INJECTION 61%
80.0000 mL | Freq: Once | INTRAVENOUS | Status: AC | PRN
  Administered 2015-06-02: 80 mL via INTRAVENOUS

## 2015-06-03 ENCOUNTER — Encounter: Payer: Self-pay | Admitting: Gastroenterology

## 2015-06-03 NOTE — Progress Notes (Addendum)
     05/26/2015 Matthew [name] [id number] [birth year]   History of Present Illness:  This is an 82 year old male with a past medical history of GERD, diverticulosis, epigastric and mid-abdominal pain.  He also has a history of dementia, peripheral neuropathy, chronic kidney disease, hyperlipidemia and remote prostate cancer. He was evaluated by Arta Bruce, PA-C in November for persistent epigastric and periumbilical abdominal pain.  Cross-sectional imaging was performed in December which showed no acute findings but did show a fluid attenuating structure in the uncinate process of the pancreas. Ultrasound performed one month earlier also showed no acute abnormality and possibly mild fatty liver disease. He had an upper endoscopy performed by Dr. Hilarie Fredrickson on 12/11/2014 which showed a partial nonobstructing Schatzki's ring, small hiatal hernia and was otherwise normal. Gastric biopsies were unrevealing. Given pain and pancreatic cystic lesion EUS was performed by Oretha Caprice, M.D. on 01/15/2015. This revealed a 2.2 cm cystic mass in the uncinate pancreas. This was aspirated. The pancreatic parenchymal is otherwise normal as was the pancreatic duct and CBD. The cyst aspirate showed low CEA and high amylase with no malignant cells. This was felt to be a benign cyst and no further testing necessary.  He returned to see Dr. Hilarie Fredrickson in follow-up on 02/10/2015 at which time he had been feeling better.  He returns to the office today with complaints of recurrent abdominal pain and nausea.  He has a hard time relaying his symptoms to me.  He says that the symptoms are similar as to what he was experiencing back in December.  Seems that the nausea is the largest issue and he keeps saying that he can "tolerate the pain".  Symptoms not affected by eating.   Current Medications, Allergies, Past Medical History, Past Surgical History, Family History and Social History were reviewed in [employer]  record.   Physical Exam: BP 110/72 mmHg  Pulse 72  Ht 5\' 9"  (1.753 m)  Wt 201 lb 4 oz (91.286 kg)  BMI 29.71 kg/m2 General: Well developed white male in no acute distress Head: Normocephalic and atraumatic Eyes:  Sclerae anicteric, conjunctiva pink  Ears: Normal auditory acuity Lungs: Clear throughout to auscultation Heart: Regular rate and rhythm Abdomen: Soft, non-distended.  Normal bowel sounds.  Mild epigastric TTP without R/R/G. Musculoskeletal: Symmetrical with no gross deformities  Extremities: No edema  Neurological: Alert oriented x 4, grossly non-focal Psychological:  Alert and cooperative. Normal mood and affect  Assessment and Recommendations: -82 year old male with pancreatic cyst that was drained.  Had abdominal pain previously that seemed to be resolved with that drainage.  Now has recurrent pain/nausea.  Discussed with Dr. Hilarie Fredrickson who thinks that we should repeat imaging to see if the cyst has returned.  Will order CT scan abdomen with contrast.  Will give prescription for Zofran.  Addendum: Reviewed and agree with initial management. [name], MD

## 2015-06-04 ENCOUNTER — Encounter: Payer: Self-pay | Admitting: Physician Assistant

## 2015-06-04 ENCOUNTER — Ambulatory Visit (INDEPENDENT_AMBULATORY_CARE_PROVIDER_SITE_OTHER): Payer: Medicare Other | Admitting: Physician Assistant

## 2015-06-04 VITALS — BP 100/66 | HR 60 | Ht 69.0 in | Wt 202.0 lb

## 2015-06-04 DIAGNOSIS — R0789 Other chest pain: Secondary | ICD-10-CM

## 2015-06-04 DIAGNOSIS — I739 Peripheral vascular disease, unspecified: Secondary | ICD-10-CM | POA: Diagnosis not present

## 2015-06-04 DIAGNOSIS — I350 Nonrheumatic aortic (valve) stenosis: Secondary | ICD-10-CM

## 2015-06-04 DIAGNOSIS — E785 Hyperlipidemia, unspecified: Secondary | ICD-10-CM | POA: Diagnosis not present

## 2015-06-04 DIAGNOSIS — I10 Essential (primary) hypertension: Secondary | ICD-10-CM

## 2015-06-04 NOTE — Patient Instructions (Signed)
Your physician wants you to follow-up in: 6 months with Dr. Hochrein.  You will receive a reminder letter in the mail two months in advance. If you don't receive a letter, please call our office to schedule the follow-up appointment.  

## 2015-06-04 NOTE — Progress Notes (Signed)
Patient ID: Matthew [name], male   DOB: 11-Sep-1930, 82 y.o.   MRN: [id number]    Date:  06/04/2015   ID:  Matthew [name], DOB 1930-12-06, MRN [id number]  PCP:  [name], MD  Primary Cardiologist:  [name]  Chief Complaint  Patient presents with  . Follow-up    some shortness of breath, burning in feet,      History of Present Illness: Matthew [name] is a 82 y.o. male  with a history of Alzheimer's dementia, HTN, HL, neuropathy, non obstructive CAD, CKD, remote hx of prostate cancer, pancreatic cystic mass (felt benign cyst) and chronic abdominal pain.    The patient was seen by [name] (02/2012- last seen) for abnormal EKG. He was noted to have T-wave inversions in his anterior leads this year during routine exam. F/u stress perfusion study did not demonstrated evidence of ischemia on images. However, he did have 2 mm horizontal ST changes in the lateral leads. He did have chest discomfort with exertion. F/u cath showed mild non obstructive CAD.   Patient was admitted April 20 for evaluation of chest pain. EKG showed sinus rhythm at rate of 60 bpm nonspecific T-wave abnormality in l, AVL and V4 which appears somewhat similar to prior EKG. EKG shows new T-wave inversion in lead V5 and V6.  He had a low risk nuclear stress test on 05/01/2015. Normal EF.  He presents for posthospital evaluation and follow-up ER visit on May 8 for chest pain.  Patient reports doing well. Has had no further pain or complaints at this time.  The patient currently denies nausea, vomiting, fever, chest pain, shortness of breath, orthopnea, dizziness, PND, cough, congestion, abdominal pain, hematochezia, melena, lower extremity edema, claudication.  Wt Readings from Last 3 Encounters:  06/04/15 202 lb (91.627 kg)  05/26/15 201 lb 4 oz (91.286 kg)  05/12/15 201 lb (91.173 kg)     Past Medical History  Diagnosis Date  . Arthritis   . Anxiety   . Hypertension   . Hyperlipidemia   . Chronic kidney  disease   . Prostate cancer [facility])     Dr [name]  . Cataract     NOS  . Impotence of organic origin   . PVD (peripheral vascular disease) ([place])   . Metabolic syndrome X   . Dysphagia     PMH of  . Diverticulosis of colon   . Hemorrhoids, internal   . Hiatal hernia   . Skin cancer   . History of colon polyps 2005    hyperplastic; Dr [name]  . Schatzki's ring   . Alzheimer's dementia     "don't know what stage but it's not early" (04/30/2015)  . CAD (coronary artery disease)     a.  mild non obstructive CAD on cath 02/2011    Current Outpatient Prescriptions  Medication Sig Dispense Refill  . aspirin EC 81 MG tablet Take 81 mg by mouth daily.    . bisacodyl (DULCOLAX) 5 MG EC tablet Take 5 mg by mouth daily as needed for moderate constipation.    . cyclobenzaprine (FLEXERIL) 5 MG tablet Take 1 tablet (5 mg total) by mouth 3 (three) times daily as needed for muscle spasms. 30 tablet 0  . dimenhyDRINATE (DRAMAMINE) 50 MG tablet Take 50 mg by mouth every 8 (eight) hours as needed for nausea.    [name] donepezil (ARICEPT) 10 MG tablet Take 1 tablet (10 mg total) by mouth at bedtime. 30 tablet 12  .  lidocaine (XYLOCAINE) 5 % ointment Apply 1 application topically as needed for moderate pain. Prescription faxed to [place], 12 refills    . memantine (NAMENDA) 10 MG tablet Take 1 tablet (10 mg total) by mouth 2 (two) times daily. (Patient taking differently: Take 10 mg by mouth every evening. ) 60 tablet 12  . omeprazole (PRILOSEC) 40 MG capsule Take 1 capsule (40 mg total) by mouth daily. 90 capsule 3  . ondansetron (ZOFRAN ODT) 4 MG disintegrating tablet Dissolve 1 tablet on the tongue 3 times daily. 90 tablet 1  . ondansetron (ZOFRAN) 4 MG tablet Take 1 tablet (4 mg total) by mouth every 8 (eight) hours as needed for nausea or vomiting. 40 tablet 1  . polyethylene glycol (MIRALAX / GLYCOLAX) packet Take 17 g by mouth daily as needed (constipation).     . pregabalin  (LYRICA) 75 MG capsule Take 1 capsule (75 mg total) by mouth 2 (two) times daily. 60 capsule 3  . simvastatin (ZOCOR) 40 MG tablet TAKE 1 BY MOUTH AT BEDTIME 90 tablet 3  . traMADol (ULTRAM) 50 MG tablet Take 1 tablet (50 mg total) by mouth 2 (two) times daily. 60 tablet 0   No current facility-administered medications for this visit.    Allergies:   No Known Allergies  Social History:  The patient  reports that he has never smoked. He has never used smokeless tobacco. He reports that he does not drink alcohol or use illicit drugs.   Family history:   Family History  Problem Relation Age of Onset  . Lung cancer Father     smoker  . Throat cancer Father   . Kidney disease Father     nephrectomy & partial nephrectomy  . Breast cancer Mother   . Dementia Mother   . Heart attack Mother 27  . Prostate cancer Brother   . Diabetes Maternal Grandmother   . Heart attack Maternal Grandmother     in 25s  . Heart disease Brother     x 3 (one brother with CAD in the 66s, another age 79s, third one 53s CABG)  . Stroke Neg Hx     ROS:  Please see the history of present illness.  All other systems reviewed and negative.   PHYSICAL EXAM: VS:  BP 100/66 mmHg  Pulse 60  Ht 5\' 9"  (1.753 m)  Wt 202 lb (91.627 kg)  BMI 29.82 kg/m2 Well nourished, well developed, in no acute distress HEENT: Pupils are equal round react to light accommodation extraocular movements are intact.  Neck: no JVDNo cervical lymphadenopathy. Cardiac: Regular rate and rhythm with 2/6 systolic murmur heard at the apex  Lungs:  clear to auscultation bilaterally, no wheezing, rhonchi or rales Abd: soft, nontender, positive bowel sounds all quadrants, no hepatosplenomegaly Ext: no lower extremity edema.  2+ radial and dorsalis pedis pulses. Skin: warm and dry Neuro:  Grossly normal      ASSESSMENT AND PLAN:  Problem List Items Addressed This Visit    PVD   Mild aortic stenosis   Hyperlipidemia   Essential  hypertension - Primary   Chest pain      Patient is a no further chest pain. He had a nonischemic nuclear stress test in April and mild nonobstructive CAD by cath in November 2014. 2-D echocardiogram April 2017 revealed normal ejection fraction wall motion grade 1 diastolic dysfunction. Mild aortic valve stenosis. The left atrium was moderately dilated. He's on a statin for his hyperlipidemia. No  complaints of claudication blood pressures well-controlled. Follow-up in 6 months.

## 2015-06-09 ENCOUNTER — Telehealth: Payer: Self-pay | Admitting: *Deleted

## 2015-06-09 NOTE — Telephone Encounter (Signed)
Left a message for patient to call back. 

## 2015-06-09 NOTE — Telephone Encounter (Signed)
-----   Message from Loralie Champagne, PA-C sent at 06/05/2015  4:42 PM EDT ----- Regarding: FW: CT scan Please let the patient know that his CT scan shows the same pancreatic cyst that was seen previously. Dr. Hilarie Fredrickson discussed with Dr. Ardis Hughs, who is our "pancreas guy", and he does not think that repeat endoscopic ultrasound and cyst aspiration would be wise or ultimately helpful at this time. We are just going to treat him symptomatically for now. How is he feeling? I had given him Zofran, does that seem to be working for him? Does he feel that he needs something for pain?  Thank you,  Jess  ----- Message -----    From: [name], MD    Sent: 06/05/2015   1:43 PM      To: Loralie Champagne, PA-C Subject: RE: CT scan                                    Ardis Hughs did NOT feel that repeat EUS or cyst aspiration would be wise or ultimately helpful We likely need to treat pain Tramadol starting at low dose? JMP  ----- Message -----    From: Loralie Champagne, PA-C    Sent: 06/03/2015  11:51 AM      To: [name], MD Subject: CT scan                                        Repeat CT scan yesterday looks like pancreatic cyst is still there/returned.  You had mentioned about talking to [facility] about repeat drainage to see if this relieves his symptoms again vs injecting the cyst, etc.  Let me know what you are thinking.  I have not contacted the patient with results yet.  Jess  ----- Message -----    From: Rad Results In Interface    Sent: 06/02/2015   4:29 PM      To: Loralie Champagne, PA-C

## 2015-06-10 NOTE — Telephone Encounter (Signed)
Left a message for patient to call back. 

## 2015-06-15 NOTE — Telephone Encounter (Signed)
Left a message for patient to call for results.

## 2015-06-16 ENCOUNTER — Encounter: Payer: Self-pay | Admitting: *Deleted

## 2015-06-16 NOTE — Telephone Encounter (Signed)
Letter mailed to patient with recommendations.

## 2015-06-16 NOTE — Telephone Encounter (Signed)
Patient is not returning our calls. Do you want to send him a letter?

## 2015-06-16 NOTE — Telephone Encounter (Signed)
You can send a later stating what I sent to you below.  I am sure that if he is continuing to have pain then he will be calling or coming back again.  Thank you,  Jess

## 2015-06-25 DIAGNOSIS — D225 Melanocytic nevi of trunk: Secondary | ICD-10-CM | POA: Diagnosis not present

## 2015-06-25 DIAGNOSIS — Z85820 Personal history of malignant melanoma of skin: Secondary | ICD-10-CM | POA: Diagnosis not present

## 2015-06-25 DIAGNOSIS — L570 Actinic keratosis: Secondary | ICD-10-CM | POA: Diagnosis not present

## 2015-06-25 DIAGNOSIS — D1801 Hemangioma of skin and subcutaneous tissue: Secondary | ICD-10-CM | POA: Diagnosis not present

## 2015-06-25 DIAGNOSIS — D485 Neoplasm of uncertain behavior of skin: Secondary | ICD-10-CM | POA: Diagnosis not present

## 2015-06-25 DIAGNOSIS — L821 Other seborrheic keratosis: Secondary | ICD-10-CM | POA: Diagnosis not present

## 2015-06-25 DIAGNOSIS — L812 Freckles: Secondary | ICD-10-CM | POA: Diagnosis not present

## 2015-06-29 ENCOUNTER — Other Ambulatory Visit: Payer: Self-pay | Admitting: Emergency Medicine

## 2015-06-29 MED ORDER — TRAMADOL HCL 50 MG PO TABS: 50.0000 mg | ORAL_TABLET | Freq: Two times a day (BID) | ORAL | Status: DC

## 2015-06-29 NOTE — Telephone Encounter (Signed)
RX faxed to [facility] 

## 2015-07-28 ENCOUNTER — Other Ambulatory Visit: Payer: Self-pay | Admitting: Emergency Medicine

## 2015-07-28 MED ORDER — TRAMADOL HCL 50 MG PO TABS: 50.0000 mg | ORAL_TABLET | Freq: Two times a day (BID) | ORAL | Status: DC

## 2015-07-29 NOTE — Telephone Encounter (Signed)
RX faxed to [facility] 

## 2015-08-19 ENCOUNTER — Encounter: Payer: Self-pay | Admitting: Diagnostic Neuroimaging

## 2015-08-19 ENCOUNTER — Ambulatory Visit (INDEPENDENT_AMBULATORY_CARE_PROVIDER_SITE_OTHER): Payer: Medicare Other | Admitting: Diagnostic Neuroimaging

## 2015-08-19 VITALS — BP 123/69 | HR 57 | Wt 198.8 lb

## 2015-08-19 DIAGNOSIS — F0390 Unspecified dementia without behavioral disturbance: Secondary | ICD-10-CM

## 2015-08-19 DIAGNOSIS — G609 Hereditary and idiopathic neuropathy, unspecified: Secondary | ICD-10-CM

## 2015-08-19 DIAGNOSIS — G4700 Insomnia, unspecified: Secondary | ICD-10-CM | POA: Diagnosis not present

## 2015-08-19 DIAGNOSIS — F03A0 Unspecified dementia, mild, without behavioral disturbance, psychotic disturbance, mood disturbance, and anxiety: Secondary | ICD-10-CM

## 2015-08-19 MED ORDER — DONEPEZIL HCL 10 MG PO TABS: 10.0000 mg | ORAL_TABLET | Freq: Every day | ORAL | 12 refills | Status: DC

## 2015-08-19 MED ORDER — MEMANTINE HCL 10 MG PO TABS
10.0000 mg | ORAL_TABLET | Freq: Two times a day (BID) | ORAL | 12 refills | Status: DC
Start: 2015-08-19 — End: 2016-08-22

## 2015-08-19 NOTE — Progress Notes (Signed)
[facility]  PATIENT: Matthew [name] DOB: June 29, 1930  REFERRING CLINICIAN: Hopper  HISTORY FROM: patient, wife REASON FOR VISIT: follow up    HISTORICAL  CHIEF COMPLAINT:  Chief Complaint  Patient presents with  . Dementia    rm 7, wife [name], MMSE 20  . Follow-up    6 month    HISTORY OF PRESENT ILLNESS:   UPDATE 08/19/15: Since last visit, overall doing well. Some issues with interrupted sleep at night, but excessive sleep in the day time. Some intermittent frustration. Memory loss stable.   UPDATE 02/19/15: Since last visit, patient doing well. Patient feels fine. Wife thinks pt is stable, but sometimes slightly worse. He is having comprehension / attention problems. Now having non-threatening visual hallucinations. Still driving (only when accompanied by wife). Also with burning feet sensation (on lyrica per PCP; neuropathy labs unremarkable).   UPDATE 07/03/14: Since last visit, lost to follow up. Memory loss is progressing. Patient in denial of memory problems. He stays active working in the yard. Still driving. Wife takes care of financial issues.   New HPI (02/13/12): 82 year old male with hypercholesteremia, here for evaluation of memory problems. Past one to 2 years patient has developed progressive short-term memory problems. Patient denies any significant memory problems. Patient's wife has noticed progressive problems including remembering tasks, conversations, recent events. He does repeat himself frequent. Having difficulty taking his medications as well as helping his wife take her medications. As outpatient previously for headache and neck pain. The patient referred for a different problem.  PRIOR HPI (05/16/11): 82 year old right-handed male with hypercholesterolemia, here for evaluation of headaches. Patient reports history of cervical spine surgery in the 1980s, following which he developed chronic headaches. He thinks that his headaches are generated  from his chronic neck pain. Headaches have been worse the last 6-12 months. He is seen multiple neurologists in the past. Is not sure what medications he can treated with. He describes a pain in the vertex of his head, throbbing sensation, without nausea or vomiting. He does have photophobia and phonophobia. He has tried tramadol without relief.   REVIEW OF SYSTEMS: Full 14 system review of systems performed and negative except as per HPI.    ALLERGIES: No Known Allergies  HOME MEDICATIONS: Outpatient Medications Prior to Visit  Medication Sig Dispense Refill  . aspirin EC 81 MG tablet Take 81 mg by mouth daily.    . bisacodyl (DULCOLAX) 5 MG EC tablet Take 5 mg by mouth daily as needed for moderate constipation.    . cyclobenzaprine (FLEXERIL) 5 MG tablet Take 1 tablet (5 mg total) by mouth 3 (three) times daily as needed for muscle spasms. 30 tablet 0  . dimenhyDRINATE (DRAMAMINE) 50 MG tablet Take 50 mg by mouth every 8 (eight) hours as needed for nausea.    [name] donepezil (ARICEPT) 10 MG tablet Take 1 tablet (10 mg total) by mouth at bedtime. 30 tablet 12  . lidocaine (XYLOCAINE) 5 % ointment Apply 1 application topically as needed for moderate pain. Prescription faxed to [facility], 12 refills    . memantine (NAMENDA) 10 MG tablet Take 1 tablet (10 mg total) by mouth 2 (two) times daily. (Patient taking differently: Take 10 mg by mouth every evening. ) 60 tablet 12  . omeprazole (PRILOSEC) 40 MG capsule Take 1 capsule (40 mg total) by mouth daily. 90 capsule 3  . ondansetron (ZOFRAN ODT) 4 MG disintegrating tablet Dissolve 1 tablet on the tongue 3 times daily.  90 tablet 1  . ondansetron (ZOFRAN) 4 MG tablet Take 1 tablet (4 mg total) by mouth every 8 (eight) hours as needed for nausea or vomiting. 40 tablet 1  . polyethylene glycol (MIRALAX / GLYCOLAX) packet Take 17 g by mouth daily as needed (constipation).     . pregabalin (LYRICA) 75 MG capsule Take 1 capsule (75 mg total)  by mouth 2 (two) times daily. 60 capsule 3  . simvastatin (ZOCOR) 40 MG tablet TAKE 1 BY MOUTH AT BEDTIME 90 tablet 3  . traMADol (ULTRAM) 50 MG tablet Take 1 tablet (50 mg total) by mouth 2 (two) times daily. 60 tablet 0   No facility-administered medications prior to visit.     PAST MEDICAL HISTORY: Past Medical History:  Diagnosis Date  . Alzheimer's dementia    "don't know what stage but it's not early" (04/30/2015)  . Anxiety   . Arthritis   . CAD (coronary artery disease)    a.  mild non obstructive CAD on cath 02/2011  . Cataract    NOS  . Chronic kidney disease   . Diverticulosis of colon   . Dysphagia    PMH of  . Hemorrhoids, internal   . Hiatal hernia   . History of colon polyps 2005   hyperplastic; Dr Jarold Motto  . Hyperlipidemia   . Hypertension   . Impotence of organic origin   . Metabolic syndrome X   . Prostate cancer (HCC)    Dr Aldean Ast  . PVD (peripheral vascular disease) (HCC)   . Schatzki's ring   . Skin cancer     PAST SURGICAL HISTORY: Past Surgical History:  Procedure Laterality Date  . APPENDECTOMY  1950  . BACK SURGERY    . CERVICAL FUSION    . COLONOSCOPY W/ POLYPECTOMY  2005   Dr Jarold Motto  . EUS N/A 01/15/2015   Procedure: UPPER ENDOSCOPIC ULTRASOUND (EUS) RADIAL;  Surgeon: Rachael Fee, MD;  Location: [facility] ENDOSCOPY;  Service: Endoscopy;  Laterality: N/A;  . LEFT HEART CATHETERIZATION WITH CORONARY ANGIOGRAM N/A 03/02/2012   Procedure: LEFT HEART CATHETERIZATION WITH CORONARY ANGIOGRAM;  Surgeon: Laurey Morale, MD;  Location: [facility] C[facility] LAB;  Service: Cardiovascular;  Laterality: N/A;  . PROSTATECTOMY  1995   Dr.[name]; no chemo or radiation  . SHOULDER SURGERY     right  . TONSILLECTOMY      FAMILY HISTORY: Family History  Problem Relation Age of Onset  . Lung cancer Father     smoker  . Throat cancer Father   . Kidney disease Father     nephrectomy & partial nephrectomy  . Breast cancer Mother   . Dementia Mother   .  Heart attack Mother 50  . Prostate cancer Brother   . Diabetes Maternal Grandmother   . Heart attack Maternal Grandmother     in 61s  . Heart disease Brother     x 3 (one brother with CAD in the 70s, another age 60s, third one 69s CABG)  . Stroke Neg Hx     SOCIAL HISTORY:  Social History   Social History  . Marital status: Married    Spouse name: Nettie Elm  . Number of children: 4  . Years of education: 12   Occupational History  . Retired     [employer]   Social History Main Topics  . Smoking status: Never Smoker  . Smokeless tobacco: Never Used  . Alcohol use No  . Drug use: No  . Sexual  activity: Not on file   Other Topics Concern  . Not on file   Social History Narrative   Lives at home with wife.      PHYSICAL EXAM  GENERAL EXAM/CONSTITUTIONAL: Vitals:  Vitals:   08/19/15 1038  BP: 123/69  Pulse: (!) 57  Weight: 198 lb 12.8 oz (90.2 kg)   Body mass index is 29.36 kg/m. No exam data present  Patient is in no distress; well developed, nourished and groomed; neck is supple  CARDIOVASCULAR:  Examination of carotid arteries is normal; no carotid bruits  Regular rate and rhythm, no murmurs  Examination of peripheral vascular system by observation and palpation is normal  EYES:  Ophthalmoscopic exam of optic discs and posterior segments is normal; no papilledema or hemorrhages  MUSCULOSKELETAL:  Gait, strength, tone, movements noted in Neurologic exam below  NEUROLOGIC: MENTAL STATUS:  MMSE - [place] Exam 08/19/2015 02/19/2015 07/03/2014  Orientation to time 1 1 1   Orientation to Place 5 5 3   Registration 3 3 3   Attention/ Calculation 3 5 2   Recall 0 0 0  Language- name 2 objects 2 2 2   Language- repeat 1 0 0  Language- follow 3 step command 3 2 3   Language- read & follow direction 1 1 1   Write a sentence 1 1 1   Copy design 0 0 0  Total score 20 20 16     awake, alert, oriented to person  REGISTERS 3/3; RECALLS  0/3  DECR ATTENTION  language fluent, comprehension intact, naming intact,   fund of knowledge appropriate  JOVIAL, SMILING  CRANIAL NERVE:   2nd, 3rd, 4th, 6th - pupils equal and reactive to light, visual fields full to confrontation, extraocular muscles intact, no nystagmus  5th - facial sensation symmetric  7th - facial strength symmetric  8th - hearing intact  9th - palate elevates symmetrically, uvula midline  11th - shoulder shrug symmetric  12th - tongue protrusion midline  MOTOR:   normal bulk and tone, full strength in the BUE, BLE  SENSORY:   normal and symmetric to light touch  COORDINATION:   finger-nose-finger, fine finger movements normal  REFLEXES:   deep tendon reflexes present and symmetric  GAIT/STATION:   narrow based gait; romberg is negative    DIAGNOSTIC DATA (LABS, IMAGING, TESTING) - I reviewed patient records, labs, notes, testing and imaging myself where available.  Lab Results  Component Value Date   WBC 5.2 05/18/2015   HGB 14.5 05/18/2015   HCT 44.6 05/18/2015   MCV 92.0 05/18/2015   PLT 154 05/18/2015      Component Value Date/Time   NA 142 05/18/2015 1515   K 4.9 05/18/2015 1515   CL 105 05/18/2015 1515   CO2 28 05/18/2015 1515   GLUCOSE 95 05/18/2015 1515   BUN 23 (H) 05/18/2015 1515   CREATININE 1.51 (H) 05/18/2015 1515   CALCIUM 9.0 05/18/2015 1515   PROT 6.6 12/10/2014 1143   ALBUMIN 4.0 12/10/2014 1143   AST 13 12/10/2014 1143   ALT 10 12/10/2014 1143   ALKPHOS 41 12/10/2014 1143   BILITOT 0.4 12/10/2014 1143   GFRNONAA 41 (L) 05/18/2015 1515   GFRAA 47 (L) 05/18/2015 1515   Lab Results  Component Value Date   CHOL 161 06/27/2014   HDL 51.80 06/27/2014   LDLCALC 89 06/27/2014   TRIG 100.0 06/27/2014   CHOLHDL 3 06/27/2014   Lab Results  Component Value Date   HGBA1C 5.9 02/02/2012   Lab Results  Component Value Date   VITAMINB12 249 09/24/2014   Lab Results  Component Value Date   TSH  2.01 09/24/2014    05/31/11 MRI brain  1. Mild perisylvian atrophy. 2. Mild non-specific periventricular gliosis.    ASSESSMENT AND PLAN  81 y.o. year old male here with progressive memory loss, consistent with dementia. Also with some visual hallucinations. May represent dementia with lewy bodies.   Dx:  Mild dementia  Hereditary and idiopathic peripheral neuropathy  Insomnia    PLAN: I spent 25 minutes of face to face time with patient. Greater than 50% of time was spent in counseling and coordination of care with patient. In summary we discussed:  - continue donepezil  - continue memantine - safety issues reviewed (driving, financial, home appliances, locks) - trial of melatonin - advised social work consultation for planning and counseling  Meds ordered this encounter  Medications  . donepezil (ARICEPT) 10 MG tablet    Sig: Take 1 tablet (10 mg total) by mouth at bedtime.    Dispense:  30 tablet    Refill:  12  . memantine (NAMENDA) 10 MG tablet    Sig: Take 1 tablet (10 mg total) by mouth 2 (two) times daily.    Dispense:  60 tablet    Refill:  12   Return in about 1 year (around 08/18/2016).    [name], MD [id number], [id number] AM Certified in Neurology, Neurophysiology and Neuroimaging  [facility] [address] [place], [place] [address] [phone]

## 2015-08-19 NOTE — Patient Instructions (Signed)
 -   continue current medications  - try melatonin supplement at bedtime for sleep aid

## 2015-08-22 ENCOUNTER — Other Ambulatory Visit: Payer: Self-pay | Admitting: Internal Medicine

## 2015-08-24 NOTE — Telephone Encounter (Signed)
RX faxed to pharm  

## 2015-09-22 ENCOUNTER — Telehealth: Payer: Self-pay | Admitting: Diagnostic Neuroimaging

## 2015-09-22 NOTE — Telephone Encounter (Signed)
error 

## 2015-09-30 ENCOUNTER — Ambulatory Visit (INDEPENDENT_AMBULATORY_CARE_PROVIDER_SITE_OTHER): Payer: Medicare Other

## 2015-09-30 DIAGNOSIS — Z23 Encounter for immunization: Secondary | ICD-10-CM | POA: Diagnosis not present

## 2015-10-26 ENCOUNTER — Other Ambulatory Visit: Payer: Self-pay | Admitting: Gastroenterology

## 2015-10-26 ENCOUNTER — Other Ambulatory Visit: Payer: Self-pay | Admitting: Internal Medicine

## 2015-10-26 NOTE — Telephone Encounter (Signed)
Matthew [name]-  Patient requests refill on zofran. Do I need to continue to refill or does he need to be in contact with our office before giving additional refills?

## 2015-10-26 NOTE — Telephone Encounter (Signed)
RX faxed to [facility] 

## 2015-10-27 NOTE — Telephone Encounter (Signed)
Ok to refill now with no other refills on it.  I would let him contact our office first in the future so that we can see if he actually continues to need it.  Rollene Fare was not able to contact him back in the summer when we were trying to get in touch with him so if he is still feeling bad then he can contact us.    Thank you,  Jess

## 2015-11-09 DIAGNOSIS — N3942 Incontinence without sensory awareness: Secondary | ICD-10-CM | POA: Diagnosis not present

## 2015-11-09 DIAGNOSIS — R109 Unspecified abdominal pain: Secondary | ICD-10-CM | POA: Diagnosis not present

## 2015-11-09 DIAGNOSIS — N3941 Urge incontinence: Secondary | ICD-10-CM | POA: Diagnosis not present

## 2015-11-09 DIAGNOSIS — R350 Frequency of micturition: Secondary | ICD-10-CM | POA: Diagnosis not present

## 2015-11-13 ENCOUNTER — Ambulatory Visit: Payer: Medicare Other | Admitting: Internal Medicine

## 2015-11-20 ENCOUNTER — Telehealth: Payer: Self-pay | Admitting: Diagnostic Neuroimaging

## 2015-11-20 NOTE — Telephone Encounter (Signed)
Wife Matthew [name] called, states each month when she and daughter sort medication for the month, they run a week short (7-8 pills) on donepezil (ARICEPT) 10 MG tablet, needs a few more to where it ends on the month correctly. Please call to advise.

## 2015-11-23 MED ORDER — DONEPEZIL HCL 10 MG PO TABS
10.0000 mg | ORAL_TABLET | Freq: Every day | ORAL | 3 refills | Status: DC
Start: 2015-11-23 — End: 2016-08-22

## 2015-11-23 NOTE — Telephone Encounter (Signed)
Spoke with wife who stated her daughter fills 4 pill boxes every month and is 7 pills short on Aricept. Advised Ms Eichenberg that this should not be the case. Wife stated "it is the way my daughter does it". She stated she would prefer to get 3 month supply, but she stated she has multiple issues with mail order. Advised her this RN can send 90 day refills to local [facility] if she prefers. She stated she would prefer that , so advised her this RN will make that change. She verbalized understanding, appreciation.

## 2015-11-23 NOTE — Telephone Encounter (Signed)
LVM informing daughter, Matthew [name] that Dr Leta Baptist refilled Aricept to [facility] on 08/17/15 with refills x 1 year. Advised the pharmacy confirmed receipt, and advised she contact the pharmacy and call this office back for any additional questions, needs. Left name, number.

## 2015-11-23 NOTE — Telephone Encounter (Signed)
Pt's wife called back to advise the daughter lives in [place] and fixes the pill boxes a month at a time. She said each month the she runs 7-8 pills short. I questioned if she was giving him more than 1 day, she said "no, we would not dare to give him more than 1 a day". She seemed to think if this RX was sent to mail order for 90 day supply that would "fix" the problem. Please call

## 2015-11-23 NOTE — Addendum Note (Signed)
Addended by: Minna Antis on: 11/23/2015 09:19 AM   Modules accepted: Orders

## 2015-12-26 ENCOUNTER — Other Ambulatory Visit: Payer: Self-pay | Admitting: Internal Medicine

## 2015-12-28 DIAGNOSIS — L570 Actinic keratosis: Secondary | ICD-10-CM | POA: Diagnosis not present

## 2015-12-28 DIAGNOSIS — D1801 Hemangioma of skin and subcutaneous tissue: Secondary | ICD-10-CM | POA: Diagnosis not present

## 2015-12-28 DIAGNOSIS — L821 Other seborrheic keratosis: Secondary | ICD-10-CM | POA: Diagnosis not present

## 2015-12-28 DIAGNOSIS — Z85820 Personal history of malignant melanoma of skin: Secondary | ICD-10-CM | POA: Diagnosis not present

## 2015-12-28 DIAGNOSIS — D485 Neoplasm of uncertain behavior of skin: Secondary | ICD-10-CM | POA: Diagnosis not present

## 2015-12-28 DIAGNOSIS — L812 Freckles: Secondary | ICD-10-CM | POA: Diagnosis not present

## 2015-12-28 DIAGNOSIS — D0461 Carcinoma in situ of skin of right upper limb, including shoulder: Secondary | ICD-10-CM | POA: Diagnosis not present

## 2015-12-28 NOTE — Telephone Encounter (Signed)
Faxed to [facility] 

## 2015-12-30 ENCOUNTER — Other Ambulatory Visit: Payer: Self-pay | Admitting: Emergency Medicine

## 2015-12-30 MED ORDER — SIMVASTATIN 40 MG PO TABS: 40.0000 mg | ORAL_TABLET | Freq: Every day | ORAL | 1 refills | Status: DC

## 2016-01-13 ENCOUNTER — Telehealth: Payer: Self-pay | Admitting: Diagnostic Neuroimaging

## 2016-01-13 NOTE — Telephone Encounter (Signed)
Spoke to wife and advised her Dr Leta Baptist refilled Aricept for 3 month supply at a time in Nov. Advised her to notify her pharmacy that is how she wants to receive future refills.  Wife stated she will not need refill until mid Jan, but she will request to get 3 months at a time from now on. She verbalized understanding, appreciation  Of call.

## 2016-01-13 NOTE — Telephone Encounter (Signed)
Pt's wife called request refill for donepezil (ARICEPT) 10 MG tablet 90 day supply. She said each refill has been for 30 days and there is 1 left. Operator advised her she has to let the pharmacy know she wants to pick up 90 day supply and operator would send new request to RN.

## 2016-02-03 ENCOUNTER — Other Ambulatory Visit: Payer: Self-pay | Admitting: Internal Medicine

## 2016-02-03 NOTE — Telephone Encounter (Signed)
RX faxed to [facility] 

## 2016-03-09 ENCOUNTER — Other Ambulatory Visit: Payer: Self-pay | Admitting: Internal Medicine

## 2016-03-09 ENCOUNTER — Telehealth: Payer: Self-pay | Admitting: *Deleted

## 2016-03-09 MED ORDER — OMEPRAZOLE 40 MG PO CPDR: 40.0000 mg | DELAYED_RELEASE_CAPSULE | Freq: Every day | ORAL | 0 refills | Status: DC

## 2016-03-09 NOTE — Telephone Encounter (Signed)
RX faxed to [facility] 

## 2016-03-09 NOTE — Telephone Encounter (Signed)
Rx sent per faxed request.

## 2016-05-14 ENCOUNTER — Telehealth: Payer: Self-pay | Admitting: Internal Medicine

## 2016-05-14 ENCOUNTER — Other Ambulatory Visit: Payer: Self-pay | Admitting: Internal Medicine

## 2016-05-14 NOTE — Telephone Encounter (Signed)
Relation to pt: SYLVIA Wehrli (WIFE)  Call back number:[phone] Pharmacy: [facility] [address] [place], [place] [phone] (Phone) [phone] (Fax)     Reason for call:  Spouse requesting a cyclobenzaprine (FLEXERIL) 5 MG tablet refill, please advise

## 2016-05-16 MED ORDER — CYCLOBENZAPRINE HCL 5 MG PO TABS: 5.0000 mg | ORAL_TABLET | Freq: Three times a day (TID) | ORAL | 0 refills | Status: DC | PRN

## 2016-05-16 NOTE — Telephone Encounter (Signed)
RX faxed to [facility] 

## 2016-06-23 ENCOUNTER — Ambulatory Visit (INDEPENDENT_AMBULATORY_CARE_PROVIDER_SITE_OTHER): Payer: Medicare Other | Admitting: Internal Medicine

## 2016-06-23 ENCOUNTER — Encounter: Payer: Self-pay | Admitting: Internal Medicine

## 2016-06-23 VITALS — BP 104/70 | HR 53 | Temp 97.8°F | Resp 16 | Ht 69.0 in | Wt 203.0 lb

## 2016-06-23 DIAGNOSIS — N183 Chronic kidney disease, stage 3 unspecified: Secondary | ICD-10-CM

## 2016-06-23 DIAGNOSIS — Z85828 Personal history of other malignant neoplasm of skin: Secondary | ICD-10-CM

## 2016-06-23 DIAGNOSIS — I10 Essential (primary) hypertension: Secondary | ICD-10-CM | POA: Diagnosis not present

## 2016-06-23 DIAGNOSIS — G609 Hereditary and idiopathic neuropathy, unspecified: Secondary | ICD-10-CM

## 2016-06-23 DIAGNOSIS — Z0000 Encounter for general adult medical examination without abnormal findings: Secondary | ICD-10-CM

## 2016-06-23 DIAGNOSIS — E7800 Pure hypercholesterolemia, unspecified: Secondary | ICD-10-CM

## 2016-06-23 MED ORDER — OMEPRAZOLE 40 MG PO CPDR: 40.0000 mg | DELAYED_RELEASE_CAPSULE | Freq: Every day | ORAL | 3 refills | Status: DC

## 2016-06-23 MED ORDER — ONDANSETRON 4 MG PO TBDP: ORAL_TABLET | ORAL | 0 refills | Status: DC

## 2016-06-23 MED ORDER — RANITIDINE HCL 150 MG PO TABS: 150.0000 mg | ORAL_TABLET | Freq: Every day | ORAL | 3 refills | Status: DC

## 2016-06-23 NOTE — Assessment & Plan Note (Signed)
Sees Dr Ronnald Ramp

## 2016-06-23 NOTE — Assessment & Plan Note (Signed)
Avoid nsaids Tylenol prn

## 2016-06-23 NOTE — Progress Notes (Signed)
Subjective:    Patient ID: Matthew [name], male    DOB: [birth year], 83 y.o.   MRN: [id number]  HPI  He is here for a physical exam.   He sees people that are not there when he closes his eyes at night.    Every night after he eats he eats "sick on his stomach".  He sometimes feels that way during the day.  He does not remember if it is nausea or heartburn.  Tums has helped in the past. He has intermittent nausea.     Medications and allergies reviewed with patient and updated if appropriate.  Patient Active Problem List   Diagnosis Date Noted  . Nausea without vomiting 06/01/2015  . Periumbilical pain 41/32/4401  . Mild aortic stenosis 05/12/2015  . Abdominal pain 05/12/2015  . Constipation 05/12/2015  . Essential hypertension   . Chest pain 04/30/2015  . CKD (chronic kidney disease) stage 3, GFR 30-59 ml/min 04/30/2015  . Peripheral neuropathy 04/30/2015  . Pain in the chest   . Pancreatic cyst 02/12/2015  . Hereditary and idiopathic peripheral neuropathy 11/07/2014  . Elevated serum creatinine 02/04/2013  . Abnormal EKG 02/16/2012  . Moderate dementia without behavioral disturbance 11/02/2009  . SKIN CANCER, HX OF 11/02/2009  . ANXIETY 02/03/2007  . HEMORRHOIDS, INTERNAL 02/03/2007  . ARTHRITIS 02/03/2007  . SLEEP APNEA 02/03/2007  . Hyperlipidemia 11/13/2006  . METABOLIC SYNDROME X 02/72/5366  . PVD 11/13/2006  . PROSTATE CANCER, HX OF 11/13/2006  . KIDNEY DISEASE, CHRONIC NOS 03/27/2006  . 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 OSTEOARTHROSIS NOS, UNSPECIFIED SITE 03/27/2006  . COLONIC POLYPS, HYPERPLASTIC 12/24/2003  . HIATAL HERNIA 12/24/2003  . Diverticulosis of large intestine 12/24/2003    Current Outpatient Prescriptions on File Prior to Visit  Medication Sig Dispense Refill  . aspirin EC 81 MG tablet Take 81 mg by mouth daily.    . bisacodyl (DULCOLAX) 5 MG EC tablet Take 5 mg by mouth daily as needed for moderate constipation.    . cyclobenzaprine  (FLEXERIL) 5 MG tablet Take 1 tablet (5 mg total) by mouth 3 (three) times daily as needed for muscle spasms. -- Office visit needed for further refills 30 tablet 0  . dimenhyDRINATE (DRAMAMINE) 50 MG tablet Take 50 mg by mouth every 8 (eight) hours as needed for nausea.    [name] donepezil (ARICEPT) 10 MG tablet Take 1 tablet (10 mg total) by mouth at bedtime. 90 tablet 3  . lidocaine (XYLOCAINE) 5 % ointment Apply 1 application topically as needed for moderate pain. Prescription faxed to [facility], 12 refills    . LYRICA 75 MG capsule take 1 capsule by mouth twice a day 60 capsule 3  . memantine (NAMENDA) 10 MG tablet Take 1 tablet (10 mg total) by mouth 2 (two) times daily. 60 tablet 12  . ondansetron (ZOFRAN ODT) 4 MG disintegrating tablet Dissolve 1 tablet on the tongue 3 times daily. 90 tablet 1  . polyethylene glycol (MIRALAX / GLYCOLAX) packet Take 17 g by mouth daily as needed (constipation).     . simvastatin (ZOCOR) 40 MG tablet Take 1 tablet (40 mg total) by mouth daily at 6 PM. 90 tablet 1  . traMADol (ULTRAM) 50 MG tablet take 1 tablet by mouth twice a day 60 tablet 0   No current facility-administered medications on file prior to visit.     Past Medical History:  Diagnosis Date  . Alzheimer's dementia    "don't know  what stage but it's not early" (04/30/2015)  . Anxiety   . Arthritis   . CAD (coronary artery disease)    a.  mild non obstructive CAD on cath 02/2011  . Cataract    NOS  . Chronic kidney disease   . Diverticulosis of colon   . Dysphagia    PMH of  . Hemorrhoids, internal   . Hiatal hernia   . History of colon polyps 2005   hyperplastic; Dr [name]  . Hyperlipidemia   . Hypertension   . Impotence of organic origin   . Metabolic syndrome X   . Prostate cancer ([facility])    Dr [name]  . PVD (peripheral vascular disease) ([place])   . Schatzki's ring   . Skin cancer     Past Surgical History:  Procedure Laterality Date  . APPENDECTOMY  1950    . BACK SURGERY    . CERVICAL FUSION    . COLONOSCOPY W/ POLYPECTOMY  2005   Dr [name]  . EUS N/A 01/15/2015   Procedure: UPPER ENDOSCOPIC ULTRASOUND (EUS) RADIAL;  Surgeon: [name], MD;  Location: [facility] ENDOS[facility];  Service: Endoscopy;  Laterality: N/A;  . LEFT HEART CATHETERIZATION WITH CORONARY ANGIOGRAM N/A 03/02/2012   Procedure: LEFT HEART CATHETERIZATION WITH CORONARY ANGIOGRAM;  Surgeon: [name], MD;  Location: [facility] CATH LAB;  Service: Cardiovascular;  Laterality: N/A;  . [place]; no chemo or radiation  . SHOULDER SURGERY     right  . TONSILLECTOMY      Social History   Social History  . Marital status: Married    Spouse name: [name]  . Number of children: 4  . Years of education: 12   Occupational History  . Retired     [employer]   Social History Main Topics  . Smoking status: Never Smoker  . Smokeless tobacco: Never Used  . Alcohol use No  . Drug use: No  . Sexual activity: Not Asked   Other Topics Concern  . None   Social History Narrative   Lives at home with wife.      Family History  Problem Relation Age of Onset  . Lung cancer Father        smoker  . Throat cancer Father   . Kidney disease Father        nephrectomy & partial nephrectomy  . Breast cancer Mother   . Dementia Mother   . Heart attack Mother 29  . Prostate cancer Brother   . Heart disease Brother        x 3 (one brother with CAD in the 29s, another age 47s, third one 8s CABG)  . Diabetes Maternal Grandmother   . Heart attack Maternal Grandmother        in 21s  . Stroke Neg Hx     Review of Systems  Constitutional: Negative for appetite change, chills and fever.  Eyes: Negative for visual disturbance.  Respiratory: Negative for cough, shortness of breath and wheezing.   Cardiovascular: Positive for chest pain (intermittent, non-cardiac). Negative for palpitations and leg swelling.  Gastrointestinal: Positive for nausea.  Negative for abdominal pain, blood in stool, constipation and diarrhea.       GERD  Genitourinary: Negative for dysuria and hematuria.  Musculoskeletal: Positive for back pain. Negative for arthralgias and myalgias.  Skin: Negative for color change and rash.  Neurological: Positive for headaches (occasional). Negative for light-headedness.  Psychiatric/Behavioral: Negative for dysphoric mood. The patient  is nervous/anxious (sometimes ).        Objective:   Vitals:   06/23/16 0848  BP: 104/70  Pulse: (!) 53  Resp: 16  Temp: 97.8 F (36.6 C)   Filed Weights   06/23/16 0848  Weight: 203 lb (92.1 kg)   Body mass index is 29.98 kg/m.  Wt Readings from Last 3 Encounters:  06/23/16 203 lb (92.1 kg)  08/19/15 198 lb 12.8 oz (90.2 kg)  06/04/15 202 lb (91.6 kg)     Physical Exam Constitutional: He appears well-developed and well-nourished. No distress.  HENT:  Head: Normocephalic and atraumatic.  Right Ear: External ear normal.  Left Ear: External ear normal.  Mouth/Throat: Oropharynx is clear and moist.  Normal ear canals and TM b/l  Eyes: Conjunctivae and EOM are normal.  Neck: Neck supple. No tracheal deviation present. No thyromegaly present.  No carotid bruit  Cardiovascular: Normal rate, regular rhythm, normal heart sounds and intact distal pulses.   No murmur heard. Pulmonary/Chest: Effort normal and breath sounds normal. No respiratory distress. He has no wheezes. He has no rales.  Abdominal: Soft. Bowel sounds are normal. He exhibits no distension. There is no tenderness.  Genitourinary: deferred  Musculoskeletal: He exhibits no edema.  Lymphadenopathy:   He has no cervical adenopathy.  Skin: Skin is warm and dry. He is not diaphoretic.  Psychiatric: He has a normal mood and affect. His behavior is normal.        Assessment & Plan:   Physical exam: Screening blood work  ordered Immunizations  Up to date, discussed shingrix Colonoscopy - no longer needed  due to age Eye exams  Up to date  EKG  Done 05/2015 Exercise   - none - encouraged regular exercise Weight - BMI good for age Skin no concerns, sees Dr Ronnald Ramp Substance abuse   none  See Problem List for Assessment and Plan of chronic medical problems.   F/u 6 months

## 2016-06-23 NOTE — Patient Instructions (Addendum)
Test(s) ordered today. Your results will be released to [facility] (or called to you) after review, usually within 72hours after test completion. If any changes need to be made, you will be notified at that same time.  All other Health Maintenance issues reviewed.   All recommended immunizations and age-appropriate screenings are up-to-date or discussed.  No immunizations administered today.   Medications reviewed and updated.  Changes include starting ranitidine 150 mg at bedtime for heartburn.   Your prescription(s) have been submitted to your pharmacy. Please take as directed and contact our office if you believe you are having problem(s) with the medication(s).   Please followup in 6 months    Health Maintenance, Male A healthy lifestyle and preventive care is important for your health and wellness. Ask your health care provider about what schedule of regular examinations is right for you. What should I know about weight and diet? Eat a Healthy Diet  Eat plenty of vegetables, fruits, whole grains, low-fat dairy products, and lean protein.  Do not eat a lot of foods high in solid fats, added sugars, or salt.  Maintain a Healthy Weight Regular exercise can help you achieve or maintain a healthy weight. You should:  Do at least 150 minutes of exercise each week. The exercise should increase your heart rate and make you sweat (moderate-intensity exercise).  Do strength-training exercises at least twice a week.  Watch Your Levels of Cholesterol and Blood Lipids  Have your blood tested for lipids and cholesterol every 5 years starting at 81 years of age. If you are at high risk for heart disease, you should start having your blood tested when you are 81 years old. You may need to have your cholesterol levels checked more often if: ? Your lipid or cholesterol levels are high. ? You are older than 81 years of age. ? You are at high risk for heart disease.  What should I know about  cancer screening? Many types of cancers can be detected early and may often be prevented. Lung Cancer  You should be screened every year for lung cancer if: ? You are a current smoker who has smoked for at least 30 years. ? You are a former smoker who has quit within the past 15 years.  Talk to your health care provider about your screening options, when you should start screening, and how often you should be screened.  Colorectal Cancer  Routine colorectal cancer screening usually begins at 81 years of age and should be repeated every 5-10 years until you are 81 years old. You may need to be screened more often if early forms of precancerous polyps or small growths are found. Your health care provider may recommend screening at an earlier age if you have risk factors for colon cancer.  Your health care provider may recommend using home test kits to check for hidden blood in the stool.  A small camera at the end of a tube can be used to examine your colon (sigmoidoscopy or colonoscopy). This checks for the earliest forms of colorectal cancer.  Prostate and Testicular Cancer  Depending on your age and overall health, your health care provider may do certain tests to screen for prostate and testicular cancer.  Talk to your health care provider about any symptoms or concerns you have about testicular or prostate cancer.  Skin Cancer  Check your skin from head to toe regularly.  Tell your health care provider about any new moles or changes in moles, especially  if: ? There is a change in a mole's size, shape, or color. ? You have a mole that is larger than a pencil eraser.  Always use sunscreen. Apply sunscreen liberally and repeat throughout the day.  Protect yourself by wearing long sleeves, pants, a wide-brimmed hat, and sunglasses when outside.  What should I know about heart disease, diabetes, and high blood pressure?  If you are 31-16 years of age, have your blood pressure  checked every 3-5 years. If you are 30 years of age or older, have your blood pressure checked every year. You should have your blood pressure measured twice-once when you are at a hospital or clinic, and once when you are not at a hospital or clinic. Record the average of the two measurements. To check your blood pressure when you are not at a hospital or clinic, you can use: ? An automated blood pressure machine at a pharmacy. ? A home blood pressure monitor.  Talk to your health care provider about your target blood pressure.  If you are between 72-71 years old, ask your health care provider if you should take aspirin to prevent heart disease.  Have regular diabetes screenings by checking your fasting blood sugar level. ? If you are at a normal weight and have a low risk for diabetes, have this test once every three years after the age of 60. ? If you are overweight and have a high risk for diabetes, consider being tested at a younger age or more often.  A one-time screening for abdominal aortic aneurysm (AAA) by ultrasound is recommended for men aged 24-75 years who are current or former smokers. What should I know about preventing infection? Hepatitis B If you have a higher risk for hepatitis B, you should be screened for this virus. Talk with your health care provider to find out if you are at risk for hepatitis B infection. Hepatitis C Blood testing is recommended for:  Everyone born from [birth year] through 1965.  Anyone with known risk factors for hepatitis C.  Sexually Transmitted Diseases (STDs)  You should be screened each year for STDs including gonorrhea and chlamydia if: ? You are sexually active and are younger than 81 years of age. ? You are older than 81 years of age and your health care provider tells you that you are at risk for this type of infection. ? Your sexual activity has changed since you were last screened and you are at an increased risk for chlamydia or gonorrhea.  Ask your health care provider if you are at risk.  Talk with your health care provider about whether you are at high risk of being infected with HIV. Your health care provider may recommend a prescription medicine to help prevent HIV infection.  What else can I do?  Schedule regular health, dental, and eye exams.  Stay current with your vaccines (immunizations).  Do not use any tobacco products, such as cigarettes, chewing tobacco, and e-cigarettes. If you need help quitting, ask your health care provider.  Limit alcohol intake to no more than 2 drinks per day. One drink equals 12 ounces of beer, 5 ounces of wine, or 1 ounces of hard liquor.  Do not use street drugs.  Do not share needles.  Ask your health care provider for help if you need support or information about quitting drugs.  Tell your health care provider if you often feel depressed.  Tell your health care provider if you have ever been abused or do  not feel safe at home. This information is not intended to replace advice given to you by your health care provider. Make sure you discuss any questions you have with your health care provider. Document Released: 06/25/2007 Document Revised: 08/26/2015 Document Reviewed: 09/30/2014 Elsevier Interactive Patient Education  2018 Elsevier Inc.  

## 2016-06-23 NOTE — Assessment & Plan Note (Signed)
BP well controlled Current regimen effective and well tolerated Continue current medications at current doses  

## 2016-06-23 NOTE — Assessment & Plan Note (Signed)
Taking lyrica Continue current dose

## 2016-06-23 NOTE — Assessment & Plan Note (Signed)
Check lipid panel  Continue daily statin Regular exercise and healthy diet encouraged  

## 2016-06-26 ENCOUNTER — Other Ambulatory Visit: Payer: Self-pay | Admitting: Internal Medicine

## 2016-06-27 NOTE — Telephone Encounter (Signed)
[place] Controlled Substance Database checked. Okay to fill RX for Tramadol. Last filled 05/16/16

## 2016-06-28 ENCOUNTER — Other Ambulatory Visit (INDEPENDENT_AMBULATORY_CARE_PROVIDER_SITE_OTHER): Payer: Medicare Other

## 2016-06-28 DIAGNOSIS — Z0000 Encounter for general adult medical examination without abnormal findings: Secondary | ICD-10-CM

## 2016-06-28 DIAGNOSIS — N183 Chronic kidney disease, stage 3 unspecified: Secondary | ICD-10-CM

## 2016-06-28 DIAGNOSIS — I10 Essential (primary) hypertension: Secondary | ICD-10-CM | POA: Diagnosis not present

## 2016-06-28 LAB — COMPREHENSIVE METABOLIC PANEL
ALK PHOS: 40 U/L (ref 39–117)
ALT: 10 U/L (ref 0–53)
AST: 13 U/L (ref 0–37)
Albumin: 4.3 g/dL (ref 3.5–5.2)
BILIRUBIN TOTAL: 0.6 mg/dL (ref 0.2–1.2)
BUN: 20 mg/dL (ref 6–23)
CO2: 29 meq/L (ref 19–32)
CREATININE: 1.5 mg/dL (ref 0.40–1.50)
Calcium: 9.3 mg/dL (ref 8.4–10.5)
Chloride: 105 mEq/L (ref 96–112)
GFR: 47.34 mL/min — ABNORMAL LOW (ref >60.00)
GLUCOSE: 109 mg/dL — AB (ref 70–99)
Potassium: 4.3 mEq/L (ref 3.5–5.1)
SODIUM: 140 meq/L (ref 135–145)
TOTAL PROTEIN: 6.6 g/dL (ref 6.0–8.3)

## 2016-06-28 LAB — CBC WITH DIFFERENTIAL/PLATELET
BASOS ABS: 0.1 10*3/uL (ref 0.0–0.1)
Basophils Relative: 0.9 % (ref 0.0–3.0)
EOS ABS: 0.1 10*3/uL (ref 0.0–0.7)
Eosinophils Relative: 1.1 % (ref 0.0–5.0)
HCT: 43.6 % (ref 39.0–52.0)
Hemoglobin: 14.5 g/dL (ref 13.0–17.0)
LYMPHS ABS: 2.1 10*3/uL (ref 0.7–4.0)
Lymphocytes Relative: 33.3 % (ref 12.0–46.0)
MCHC: 33.2 g/dL (ref 30.0–36.0)
MCV: 90.2 fl (ref 78.0–100.0)
MONOS PCT: 8.2 % (ref 3.0–12.0)
Monocytes Absolute: 0.5 10*3/uL (ref 0.1–1.0)
NEUTROS ABS: 3.6 10*3/uL (ref 1.4–7.7)
NEUTROS PCT: 56.5 % (ref 43.0–77.0)
PLATELETS: 162 10*3/uL (ref 150.0–400.0)
RBC: 4.84 Mil/uL (ref 4.22–5.81)
RDW: 13.5 % (ref 11.5–15.5)
WBC: 6.3 10*3/uL (ref 4.0–10.5)

## 2016-06-28 LAB — LIPID PANEL
CHOL/HDL RATIO: 3
Cholesterol: 167 mg/dL (ref 0–200)
HDL: 57.9 mg/dL (ref >39.00)
LDL Cholesterol: 77 mg/dL (ref 0–99)
NONHDL: 109.38
Triglycerides: 163 mg/dL — ABNORMAL HIGH (ref 0.0–149.0)
VLDL: 32.6 mg/dL (ref 0.0–40.0)

## 2016-06-28 LAB — TSH: TSH: 2.1 u[IU]/mL (ref 0.35–4.50)

## 2016-06-30 ENCOUNTER — Encounter: Payer: Self-pay | Admitting: Internal Medicine

## 2016-06-30 DIAGNOSIS — Z85820 Personal history of malignant melanoma of skin: Secondary | ICD-10-CM | POA: Diagnosis not present

## 2016-06-30 DIAGNOSIS — L821 Other seborrheic keratosis: Secondary | ICD-10-CM | POA: Diagnosis not present

## 2016-06-30 DIAGNOSIS — D225 Melanocytic nevi of trunk: Secondary | ICD-10-CM | POA: Diagnosis not present

## 2016-06-30 DIAGNOSIS — L570 Actinic keratosis: Secondary | ICD-10-CM | POA: Diagnosis not present

## 2016-07-14 ENCOUNTER — Other Ambulatory Visit: Payer: Self-pay | Admitting: *Deleted

## 2016-07-14 MED ORDER — SIMVASTATIN 40 MG PO TABS: 40.0000 mg | ORAL_TABLET | Freq: Every day | ORAL | 3 refills | Status: DC

## 2016-07-14 NOTE — Telephone Encounter (Signed)
Wife left msg on triage stating husband is needing refills on his simvastatin. Called wife back confirm which pharmacy sent electronically to [facility]...Matthew [name]

## 2016-08-12 ENCOUNTER — Other Ambulatory Visit: Payer: Self-pay | Admitting: Internal Medicine

## 2016-08-15 NOTE — Telephone Encounter (Signed)
[place] Controlled Substance Database checked. Last filled on 06/27/16  

## 2016-08-15 NOTE — Telephone Encounter (Signed)
[facility]ed [facility] 

## 2016-08-22 ENCOUNTER — Ambulatory Visit (INDEPENDENT_AMBULATORY_CARE_PROVIDER_SITE_OTHER): Payer: Medicare Other | Admitting: Diagnostic Neuroimaging

## 2016-08-22 ENCOUNTER — Ambulatory Visit: Payer: Medicare Other | Admitting: Diagnostic Neuroimaging

## 2016-08-22 ENCOUNTER — Encounter: Payer: Self-pay | Admitting: Diagnostic Neuroimaging

## 2016-08-22 VITALS — BP 141/75 | HR 57 | Wt 200.0 lb

## 2016-08-22 DIAGNOSIS — F0391 Unspecified dementia with behavioral disturbance: Secondary | ICD-10-CM | POA: Diagnosis not present

## 2016-08-22 DIAGNOSIS — G609 Hereditary and idiopathic neuropathy, unspecified: Secondary | ICD-10-CM | POA: Diagnosis not present

## 2016-08-22 DIAGNOSIS — F03B18 Unspecified dementia, moderate, with other behavioral disturbance: Secondary | ICD-10-CM

## 2016-08-22 MED ORDER — DONEPEZIL HCL 10 MG PO TABS: 10.0000 mg | ORAL_TABLET | Freq: Every day | ORAL | 3 refills | Status: DC

## 2016-08-22 MED ORDER — MEMANTINE HCL 10 MG PO TABS: 10.0000 mg | ORAL_TABLET | Freq: Two times a day (BID) | ORAL | 4 refills | Status: DC

## 2016-08-22 NOTE — Progress Notes (Signed)
[facility]  PATIENT: Matthew [name] DOB: Nov 24, 1930  REFERRING CLINICIAN: [facility]  HISTORY FROM: patient, wife, son REASON FOR VISIT: follow up    HISTORICAL  CHIEF COMPLAINT:  Chief Complaint  Patient presents with  . Follow-up    Dementia follow up    HISTORY OF PRESENT ILLNESS:   UPDATE 08/22/16: Since last visit patient continues to have short-term memory problems, poor insight, interrupted sleep, inverted sleep schedule, as well as intermittent headaches and nausea. Symptoms tend to bother him mainly at nighttime. Patient continues to live with his wife at home. She also has medical problems including balance problems and diabetes. Family is concerned about their ability to live in their own home going forward. Patient continues to drive a car with his wife in the passenger seat. Family is also concerned about driving ability.  UPDATE 08/19/15: Since last visit, overall doing well. Some issues with interrupted sleep at night, but excessive sleep in the day time. Some intermittent frustration. Memory loss stable.   UPDATE 02/19/15: Since last visit, patient doing well. Patient feels fine. Wife thinks pt is stable, but sometimes slightly worse. He is having comprehension / attention problems. Now having non-threatening visual hallucinations. Still driving (only when accompanied by wife). Also with burning feet sensation (on lyrica per PCP; neuropathy labs unremarkable).   UPDATE 07/03/14: Since last visit, lost to follow up. Memory loss is progressing. Patient in denial of memory problems. He stays active working in the yard. Still driving. Wife takes care of financial issues.   New HPI (02/13/12): 83 year old male with hypercholesteremia, here for evaluation of memory problems. Past one to 2 years patient has developed progressive short-term memory problems. Patient denies any significant memory problems. Patient's wife has noticed progressive problems including  remembering tasks, conversations, recent events. He does repeat himself frequent. Having difficulty taking his medications as well as helping his wife take her medications. As outpatient previously for headache and neck pain. The patient referred for a different problem.  PRIOR HPI (05/16/11): 83 year old right-handed male with hypercholesterolemia, here for evaluation of headaches. Patient reports history of cervical spine surgery in the 1980s, following which he developed chronic headaches. He thinks that his headaches are generated from his chronic neck pain. Headaches have been worse the last 6-12 months. He is seen multiple neurologists in the past. Is not sure what medications he can treated with. He describes a pain in the vertex of his head, throbbing sensation, without nausea or vomiting. He does have photophobia and phonophobia. He has tried tramadol without relief.   REVIEW OF SYSTEMS: Full 14 system review of systems performed and negative except: Fatigue decreased appetite abdominal pain diarrhea nausea incontinence back pain joint pain agitation confusion memory loss dizziness headache.   ALLERGIES: No Known Allergies  HOME MEDICATIONS: Outpatient Medications Prior to Visit  Medication Sig Dispense Refill  . aspirin EC 81 MG tablet Take 81 mg by mouth daily.    . bisacodyl (DULCOLAX) 5 MG EC tablet Take 5 mg by mouth daily as needed for moderate constipation.    . cyclobenzaprine (FLEXERIL) 5 MG tablet Take 1 tablet (5 mg total) by mouth 3 (three) times daily as needed for muscle spasms. 30 tablet 0  . dimenhyDRINATE (DRAMAMINE) 50 MG tablet Take 50 mg by mouth every 8 (eight) hours as needed for nausea.    [name] donepezil (ARICEPT) 10 MG tablet Take 1 tablet (10 mg total) by mouth at bedtime. 90 tablet 3  . lidocaine (XYLOCAINE) 5 %  ointment Apply 1 application topically as needed for moderate pain. Prescription faxed to [facility], 12 refills    . LYRICA 75 MG capsule  take 1 capsule by mouth twice a day 60 capsule 3  . memantine (NAMENDA) 10 MG tablet Take 1 tablet (10 mg total) by mouth 2 (two) times daily. 60 tablet 12  . omeprazole (PRILOSEC) 40 MG capsule Take 1 capsule (40 mg total) by mouth daily. 90 capsule 3  . ondansetron (ZOFRAN ODT) 4 MG disintegrating tablet Dissolve 1 tablet on the tongue 3 times daily. 30 tablet 0  . polyethylene glycol (MIRALAX / GLYCOLAX) packet Take 17 g by mouth daily as needed (constipation).     . ranitidine (ZANTAC) 150 MG tablet Take 1 tablet (150 mg total) by mouth at bedtime. 90 tablet 3  . simvastatin (ZOCOR) 40 MG tablet Take 1 tablet (40 mg total) by mouth daily at 6 PM. 90 tablet 3  . traMADol (ULTRAM) 50 MG tablet take 1 tablet by mouth twice a day 60 tablet 0   No facility-administered medications prior to visit.     PAST MEDICAL HISTORY: Past Medical History:  Diagnosis Date  . Alzheimer's dementia    "don't know what stage but it's not early" (04/30/2015)  . Anxiety   . Arthritis   . CAD (coronary artery disease)    a.  mild non obstructive CAD on cath 02/2011  . Cataract    NOS  . Chronic kidney disease   . Diverticulosis of colon   . Dysphagia    PMH of  . Hemorrhoids, internal   . Hiatal hernia   . History of colon polyps 2005   hyperplastic; Dr [name]  . Hyperlipidemia   . Hypertension   . Impotence of organic origin   . Metabolic syndrome X   . Prostate cancer ([facility])    Dr [name]  . PVD (peripheral vascular disease) ([place])   . Schatzki's ring   . Skin cancer     PAST SURGICAL HISTORY: Past Surgical History:  Procedure Laterality Date  . APPENDECTOMY  1950  . BACK SURGERY    . CERVICAL FUSION    . COLONOSCOPY W/ POLYPECTOMY  2005   Dr [name]  . EUS N/A 01/15/2015   Procedure: UPPER ENDOSCOPIC ULTRASOUND (EUS) RADIAL;  Surgeon: [name], MD;  Location: [facility] ENDOSCOPY;  Service: Endoscopy;  Laterality: N/A;  . LEFT HEART CATHETERIZATION WITH CORONARY ANGIOGRAM N/A  03/02/2012   Procedure: LEFT HEART CATHETERIZATION WITH CORONARY ANGIOGRAM;  Surgeon: [name], MD;  Location: [facility] CATH LAB;  Service: Cardiovascular;  Laterality: N/A;  . [place]; no chemo or radiation  . SHOULDER SURGERY     right  . TONSILLECTOMY      FAMILY HISTORY: Family History  Problem Relation Age of Onset  . Lung cancer Father        smoker  . Throat cancer Father   . Kidney disease Father        nephrectomy & partial nephrectomy  . Breast cancer Mother   . Dementia Mother   . Heart attack Mother 51  . Prostate cancer Brother   . Heart disease Brother        x 3 (one brother with CAD in the 42s, another age 54s, third one 66s CABG)  . Diabetes Maternal Grandmother   . Heart attack Maternal Grandmother        in 56s  . Stroke Neg Hx  SOCIAL HISTORY:  Social History   Social History  . Marital status: Married    Spouse name: [name]  . Number of children: 4  . Years of education: 12   Occupational History  . Retired     [employer]   Social History Main Topics  . Smoking status: Never Smoker  . Smokeless tobacco: Never Used  . Alcohol use No  . Drug use: No  . Sexual activity: Not on file   Other Topics Concern  . Not on file   Social History Narrative   Lives at home with wife.      PHYSICAL EXAM  GENERAL EXAM/CONSTITUTIONAL: Vitals:  Vitals:   08/22/16 1321  BP: (!) 141/75  Pulse: (!) 57  Weight: 200 lb (90.7 kg)   Body mass index is 29.53 kg/m. No exam data present  Patient is in no distress; well developed, nourished and groomed; neck is supple  CARDIOVASCULAR:  Examination of carotid arteries is normal; no carotid bruits  Regular rate and rhythm, no murmurs  Examination of peripheral vascular system by observation and palpation is normal  EYES:  Ophthalmoscopic exam of optic discs and posterior segments is normal; no papilledema or hemorrhages  MUSCULOSKELETAL:  Gait,  strength, tone, movements noted in Neurologic exam below  NEUROLOGIC: MENTAL STATUS:  MMSE - [place] Exam 08/22/2016 08/19/2015 02/19/2015  Orientation to time 1 1 1   Orientation to Place 3 5 5   Registration 3 3 3   Attention/ Calculation 1 3 5   Recall 0 0 0  Language- name 2 objects 2 2 2   Language- repeat 1 1 0  Language- follow 3 step command 3 3 2   Language- read & follow direction 1 1 1   Write a sentence 1 1 1   Copy design 0 0 0  Total score 16 20 20     awake, alert, oriented to person  [place]  language fluent, comprehension intact, naming intact,   fund of knowledge appropriate  JOVIAL, SMILING, PLEASANT  CRANIAL NERVE:   2nd, 3rd, 4th, 6th - pupils equal and reactive to light, visual fields full to confrontation, extraocular muscles intact, no nystagmus  5th - facial sensation symmetric  7th - facial strength symmetric  8th - hearing intact  9th - palate elevates symmetrically, uvula midline  11th - shoulder shrug symmetric  12th - tongue protrusion midline  MOTOR:   normal bulk and tone, full strength in the BUE, BLE  SENSORY:   normal and symmetric to light touch  COORDINATION:   finger-nose-finger, fine finger movements normal  REFLEXES:   deep tendon reflexes present and symmetric  GAIT/STATION:   narrow based gait; SLOW AND CAUTIOUS    DIAGNOSTIC DATA (LABS, IMAGING, TESTING) - I reviewed patient records, labs, notes, testing and imaging myself where available.  Lab Results  Component Value Date   WBC 6.3 06/28/2016   HGB 14.5 06/28/2016   HCT 43.6 06/28/2016   MCV 90.2 06/28/2016   PLT 162.0 06/28/2016      Component Value Date/Time   NA 140 06/28/2016 1108   K 4.3 06/28/2016 1108   CL 105 06/28/2016 1108   CO2 29 06/28/2016 1108   GLUCOSE 109 (H) 06/28/2016 1108   BUN 20 06/28/2016 1108   CREATININE 1.50 06/28/2016 1108   CALCIUM 9.3 06/28/2016 1108   PROT 6.6 06/28/2016 1108   ALBUMIN  4.3 06/28/2016 1108   AST 13 06/28/2016 1108   ALT 10 06/28/2016 1108  ALKPHOS 40 06/28/2016 1108   BILITOT 0.6 06/28/2016 1108   GFRNONAA 41 (L) 05/18/2015 1515   GFRAA 47 (L) 05/18/2015 1515   Lab Results  Component Value Date   CHOL 167 06/28/2016   HDL 57.90 06/28/2016   LDLCALC 77 06/28/2016   TRIG 163.0 (H) 06/28/2016   CHOLHDL 3 06/28/2016   Lab Results  Component Value Date   HGBA1C 5.9 02/02/2012   Lab Results  Component Value Date   VITAMINB12 249 09/24/2014   Lab Results  Component Value Date   TSH 2.10 06/28/2016    05/31/11 MRI brain  1. Mild perisylvian atrophy. 2. Mild non-specific periventricular gliosis.    ASSESSMENT AND PLAN  83 y.o. year old male here with progressive memory loss, consistent with dementia. Also with some visual hallucinations. May represent dementia with lewy bodies vs alzheimer's dementia.   Dx:  Moderate dementia with behavioral disturbance  Hereditary and idiopathic peripheral neuropathy    PLAN:  I spent 40 minutes of face to face time with patient. Greater than 50% of time was spent in counseling and coordination of care with patient. In summary we discussed: - diagnosis, prognois and treatment options reviewed  - will setup home health nursing and social work - continue donepezil + memantine - safety issues reviewed (no driving; caution with finances, home appliances, wandering)  Orders Placed This Encounter  Procedures  . Ambulatory referral to [facility] ordered this encounter  Medications  . donepezil (ARICEPT) 10 MG tablet    Sig: Take 1 tablet (10 mg total) by mouth at bedtime.    Dispense:  90 tablet    Refill:  3  . memantine (NAMENDA) 10 MG tablet    Sig: Take 1 tablet (10 mg total) by mouth 2 (two) times daily.    Dispense:  180 tablet    Refill:  4   Return in about 6 months (around 02/22/2017).    [name], MD 09/25/9448, 3:88 PM Certified in Neurology, Neurophysiology and  Neuroimaging  [facility] [address] [place],  [address] [phone]

## 2016-08-22 NOTE — Patient Instructions (Addendum)
Thank you for coming to see [place] at [facility]. I hope we have been able to provide you high quality care today.  You may receive a patient satisfaction survey over the next few weeks. We would appreciate your feedback and comments so that we may continue to improve ourselves and the health of our patients.  - setup home health PT, OT, nursing and social work  - continue donepezil + memantine  - safety issues reviewed (no driving; caution with financial, home appliances, locks)   ~~~~~~~~~~~~~~~~~~~~~~~~~~~~~~~~~~~~~~~~~~~~~~~~~~~~~~~~~~~~~~~~~  DR. Leiah Giannotti'S GUIDE TO HAPPY AND HEALTHY LIVING These are some of my general health and wellness recommendations. Some of them may apply to you better than others. Please use common sense as you try these suggestions and feel free to ask me any questions.   ACTIVITY/FITNESS Mental, social, emotional and physical stimulation are very important for brain and body health. Try learning a new activity (arts, music, language, sports, games).  Keep moving your body to the best of your abilities. You can do this at home, inside or outside, the park, community center, gym or anywhere you like. Consider a physical therapist or personal trainer to get started. Consider the app Sworkit. Fitness trackers such as smart-watches, smart-phones or Fitbits can help as well.   NUTRITION Eat more plants: colorful vegetables, nuts, seeds and berries.  Eat less sugar, salt, preservatives and processed foods.  Avoid toxins such as cigarettes and alcohol.  Drink water when you are thirsty. Warm water with a slice of lemon is an excellent morning drink to start the day.  Consider these websites for more information The Nutrition Source (https://www.henry-hernandez.biz/) Precision Nutrition (WindowBlog.ch)   RELAXATION Consider practicing mindfulness meditation or other relaxation techniques such as  deep breathing, prayer, yoga, tai chi, massage. See website mindful.org or the apps Headspace or Calm to help get started.   SLEEP Try to get at least 7-8+ hours sleep per day. Regular exercise and reduced caffeine will help you sleep better. Practice good sleep hygeine techniques. See website sleep.org for more information.   PLANNING Prepare estate planning, living will, healthcare POA documents. Sometimes this is best planned with the help of an attorney. Theconversationproject.org and agingwithdignity.org are excellent resources.

## 2016-08-26 ENCOUNTER — Other Ambulatory Visit: Payer: Self-pay | Admitting: Internal Medicine

## 2016-08-26 ENCOUNTER — Telehealth: Payer: Self-pay | Admitting: Internal Medicine

## 2016-08-26 NOTE — Telephone Encounter (Signed)
Home health Physical therapy started yesterday for Pt, yesterday the pts BP was 166/94 late Afternoon, no symptoms  Pt informed him he No longer takes zofran

## 2016-08-29 NOTE — Telephone Encounter (Signed)
[place] Controlled Substance Database checked. Last filled on 07/26/16

## 2016-08-29 NOTE — Telephone Encounter (Signed)
BP has always been well controlled here.   No medication changes at this time

## 2016-08-29 NOTE — Telephone Encounter (Signed)
RX faxed to [facility] 

## 2016-08-30 NOTE — Telephone Encounter (Signed)
Spoke with Evelena Peat with [facility] to inform.

## 2016-08-31 ENCOUNTER — Telehealth: Payer: Self-pay | Admitting: Diagnostic Neuroimaging

## 2016-08-31 NOTE — Telephone Encounter (Signed)
Tommi Emery with [facility] is calling to advise the patient had OT evaluation yesterday and no further visits, exercise-activity program in place. A returned call is not needed unless there are questions.

## 2016-08-31 NOTE — Telephone Encounter (Signed)
Spoke with Matthew [name] for clarification of note. He stated he evaluated patient for OT and no further visits or therapy needed at this time.

## 2016-09-07 ENCOUNTER — Telehealth: Payer: Self-pay | Admitting: *Deleted

## 2016-09-07 NOTE — Telephone Encounter (Signed)
Received fax from [facility] stating patient's [facility] for RN and SW are denied. Called [facility] and discussed with Rogue Jury, clinical manager. She stated that an RN, PT, OT and SW visited the patient in 4 separate visits to evaluate the patient for services. She stated an [facility] evaluation must be done, and the patient must meet certain criteria for [employer] to approve the services.  She reviewed [facility] evaluation with this RN to explain why patient did not meet criteria. She stated that [facility] never approves services based on Alzheimer's or dementia diagnoses.  She stated that the patient had no physical needs, and [facility] did all they could to try and get approval for patient. Manuela Schwartz stated there was nothing further they could do to get approval at this time. Will inform Dr Leta Baptist.

## 2016-09-30 ENCOUNTER — Telehealth: Payer: Self-pay | Admitting: Internal Medicine

## 2016-09-30 MED ORDER — OMEPRAZOLE 40 MG PO CPDR: 40.0000 mg | DELAYED_RELEASE_CAPSULE | Freq: Every day | ORAL | 2 refills | Status: DC

## 2016-09-30 NOTE — Telephone Encounter (Signed)
Refill sent to [facility]...Johny Chess

## 2016-09-30 NOTE — Telephone Encounter (Signed)
Pt wife called in and pt needs refill on  omeprazole (PRILOSEC) 40 MG capsule [[id number]]

## 2016-12-14 ENCOUNTER — Telehealth: Payer: Self-pay | Admitting: *Deleted

## 2016-12-14 ENCOUNTER — Ambulatory Visit (INDEPENDENT_AMBULATORY_CARE_PROVIDER_SITE_OTHER): Payer: Medicare Other | Admitting: *Deleted

## 2016-12-14 VITALS — BP 146/60 | HR 64 | Resp 20 | Ht 69.0 in | Wt 202.0 lb

## 2016-12-14 DIAGNOSIS — Z0000 Encounter for general adult medical examination without abnormal findings: Secondary | ICD-10-CM

## 2016-12-14 DIAGNOSIS — Z23 Encounter for immunization: Secondary | ICD-10-CM | POA: Diagnosis not present

## 2016-12-14 MED ORDER — TRAMADOL HCL 50 MG PO TABS: 50.0000 mg | ORAL_TABLET | Freq: Two times a day (BID) | ORAL | 0 refills | Status: DC

## 2016-12-14 NOTE — Telephone Encounter (Signed)
During AWV, patient stated that he needed a refill for tramadol.

## 2016-12-14 NOTE — Telephone Encounter (Signed)
[place] Controlled Substance Database checked. Last filled on 10/06/16 

## 2016-12-14 NOTE — Progress Notes (Addendum)
Subjective:   Matthew [name] is a 83 y.o. male who presents for Medicare Annual/Subsequent preventive examination.  Review of Systems:  No ROS.  Medicare Wellness Visit. Additional risk factors are reflected in the social history.  Cardiac Risk Factors include: advanced age (>55men, >54 women);male gender;dyslipidemia;hypertension Sleep patterns: feels rested on waking, gets up 1-3 times nightly to void and sleeps 7-8 hours nightly.    Home Safety/Smoke Alarms: Feels safe in home. Smoke alarms in place.  Living environment; residence and Firearm Safety: 1-story house/ trailer, number of outside stairs: 4, no firearms, firearms stored safely.Lives with wife, no needs for DME, good support system Seat Belt Safety/Bike Helmet: Wears seat belt.    Objective:    Vitals: BP (!) 146/60   Pulse 64   Resp 20   Ht 5\' 9"  (1.753 m)   Wt 202 lb (91.6 kg)   SpO2 99%   BMI 29.83 kg/m   Body mass index is 29.83 kg/m.  Advanced Directives 12/14/2016 04/30/2015 04/30/2015 02/19/2015 01/15/2015 01/09/2015 12/11/2014  Does Patient Have a Medical Advance Directive? Yes No No Yes Yes Yes Yes  Type of Paramedic of [place];Living will - - [place];Living will [place];Living will [place];Living will -  Does patient want to make changes to medical advance directive? - - - - - No - Patient declined -  Copy of [place] in Chart? No - copy requested - - No - copy requested No - copy requested No - copy requested No - copy requested  Would patient like information on creating a medical advance directive? - No - patient declined information - - - - -    Tobacco Social History   Tobacco Use  Smoking Status Never Smoker  Smokeless Tobacco Never Used     Counseling given: Not Answered   Past Medical History:  Diagnosis Date  . Alzheimer's dementia    "don't know what stage but it's not early"  (04/30/2015)  . Anxiety   . Arthritis   . CAD (coronary artery disease)    a.  mild non obstructive CAD on cath 02/2011  . Cataract    NOS  . Chronic kidney disease   . Diverticulosis of colon   . Dysphagia    PMH of  . Hemorrhoids, internal   . Hiatal hernia   . History of colon polyps 2005   hyperplastic; Dr [name]  . Hyperlipidemia   . Hypertension   . Impotence of organic origin   . Metabolic syndrome X   . Prostate cancer ([place])    Dr [name]  . PVD (peripheral vascular disease) ([place])   . Schatzki's ring   . Skin cancer    Past Surgical History:  Procedure Laterality Date  . APPENDECTOMY  1950  . BACK SURGERY    . CERVICAL FUSION    . COLONOSCOPY W/ POLYPECTOMY  2005   Dr [name]  . EUS N/A 01/15/2015   Procedure: UPPER ENDOSCOPIC ULTRASOUND (EUS) RADIAL;  Surgeon: [name], MD;  Location: [facility] ENDOSCOPY;  Service: Endoscopy;  Laterality: N/A;  . LEFT HEART CATHETERIZATION WITH CORONARY ANGIOGRAM N/A 03/02/2012   Procedure: LEFT HEART CATHETERIZATION WITH CORONARY ANGIOGRAM;  Surgeon: [name], MD;  Location: [facility] CATH LAB;  Service: Cardiovascular;  Laterality: N/A;  . [place]; no chemo or radiation  . SHOULDER SURGERY     right  . TONSILLECTOMY  Family History  Problem Relation Age of Onset  . Lung cancer Father        smoker  . Throat cancer Father   . Kidney disease Father        nephrectomy & partial nephrectomy  . Breast cancer Mother   . Dementia Mother   . Heart attack Mother 42  . Prostate cancer Brother   . Heart disease Brother        x 3 (one brother with CAD in the 48s, another age 91s, third one 33s CABG)  . Diabetes Maternal Grandmother   . Heart attack Maternal Grandmother        in 32s  . Stroke Neg Hx    Social History   Socioeconomic History  . Marital status: Married    Spouse name: [name]  . Number of children: 4  . Years of education: 22  . Highest education level: None  Social  Needs  . Financial resource strain: Not hard at all  . Food insecurity - worry: Never true  . Food insecurity - inability: Never true  . Transportation needs - medical: No  . Transportation needs - non-medical: No  Occupational History  . Occupation: Retired    Comment: TEFL teacher  Tobacco Use  . Smoking status: Never Smoker  . Smokeless tobacco: Never Used  Substance and Sexual Activity  . Alcohol use: No    Alcohol/week: 0.0 oz  . Drug use: No  . Sexual activity: None  Other Topics Concern  . None  Social History Narrative   Lives at home with wife.      Outpatient Encounter Medications as of 12/14/2016  Medication Sig  . aspirin EC 81 MG tablet Take 81 mg by mouth daily.  . bisacodyl (DULCOLAX) 5 MG EC tablet Take 5 mg by mouth daily as needed for moderate constipation.  . cyclobenzaprine (FLEXERIL) 5 MG tablet Take 1 tablet (5 mg total) by mouth 3 (three) times daily as needed for muscle spasms.  [name] dimenhyDRINATE (DRAMAMINE) 50 MG tablet Take 50 mg by mouth every 8 (eight) hours as needed for nausea.  [name] donepezil (ARICEPT) 10 MG tablet Take 1 tablet (10 mg total) by mouth at bedtime.  . lidocaine (XYLOCAINE) 5 % ointment Apply 1 application topically as needed for moderate pain. Prescription faxed to [facility], 12 refills  . LYRICA 75 MG capsule take 1 capsule by mouth twice a day  . memantine (NAMENDA) 10 MG tablet Take 1 tablet (10 mg total) by mouth 2 (two) times daily.  [name] omeprazole (PRILOSEC) 40 MG capsule Take 1 capsule (40 mg total) by mouth daily.  . polyethylene glycol (MIRALAX / GLYCOLAX) packet Take 17 g by mouth daily as needed (constipation).   . ranitidine (ZANTAC) 150 MG tablet Take 1 tablet (150 mg total) by mouth at bedtime.  . simvastatin (ZOCOR) 40 MG tablet Take 1 tablet (40 mg total) by mouth daily at 6 PM.  . traMADol (ULTRAM) 50 MG tablet take 1 tablet by mouth twice a day   No facility-administered encounter medications  on file as of 12/14/2016.     Activities of Daily Living In your present state of health, do you have any difficulty performing the following activities: 12/14/2016  Hearing? N  Vision? N  Difficulty concentrating or making decisions? Y  Walking or climbing stairs? N  Dressing or bathing? N  Doing errands, shopping? Y  Preparing Food and eating ? N  Using the Toilet?  N  In the past six months, have you accidently leaked urine? N  Do you have problems with loss of bowel control? N  Managing your Medications? Y  Managing your Finances? Y  Housekeeping or managing your Housekeeping? Y  Some recent data might be hidden     Patient Care Team: [name], MD as PCP - General (Internal Medicine) [name], MD as Consulting Physician (Neurology) Bjorn Loser, MD as Consulting Physician (Urology) Jarome Matin, MD as Consulting Physician (Dermatology)   Assessment:    Physical assessment deferred to PCP.  Exercise Activities and Dietary recommendations Current Exercise Habits: The patient does not participate in regular exercise at present(chair exercise pamphlets provided), Exercise limited by: None identified Diet (meal preparation, eat out, water intake, caffeinated beverages, dairy products, fruits and vegetables): in general, a "healthy" diet  , well balanced  Reviewed heart healthy diet, encouraged patient to increase daily water intake.  Goals    . Patient Stated     Stay as health and as independent as possible by being active, enjoy life and family.      Fall Risk Fall Risk  12/14/2016 08/22/2016 06/23/2016 08/19/2015 02/19/2015  Falls in the past year? No No No No No   Depression Screen PHQ 2/9 Scores 12/14/2016 06/23/2016 07/28/2014 02/04/2013  PHQ - 2 Score 0 0 0 0  PHQ- 9 Score - - 1 -    Cognitive Function MMSE - Mini Mental State Exam 08/22/2016 08/19/2015 02/19/2015 07/03/2014  Orientation to time 1 1 1 1   Orientation to Place 3 5 5 3   Registration 3 3 3  3   Attention/ Calculation 1 3 5 2   Recall 0 0 0 0  Language- name 2 objects 2 2 2 2   Language- repeat 1 1 0 0  Language- follow 3 step command 3 3 2 3   Language- read & follow direction 1 1 1 1   Write a sentence 1 1 1 1   Copy design 0 0 0 0  Total score 16 20 20 16         Immunization History  Administered Date(s) Administered  . Influenza Split 11/11/2010, 11/03/2011  . Influenza Whole 11/16/2006, 10/18/2007, 10/06/2008, 11/02/2009  . Influenza, High Dose Seasonal PF 09/30/2015, 12/14/2016  . Influenza,inj,Quad PF,6+ Mos 11/01/2012, 11/09/2013, 09/24/2014  . Pneumococcal Conjugate-13 07/28/2014  . Pneumococcal Polysaccharide-23 11/09/2004  . Td 08/31/1995, 02/02/2012  . Zoster 11/02/2009   Screening Tests Health Maintenance  Topic Date Due  . INFLUENZA VACCINE  08/10/2016  . TETANUS/TDAP  02/01/2022  . PNA vac Low Risk Adult  Completed      Plan:    Start doing brain stimulating activities (puzzles, reading, adult coloring books, staying active) to keep memory sharp.   Continue to eat heart healthy diet (full of fruits, vegetables, whole grains, lean protein, water--limit salt, fat, and sugar intake) and increase physical activity as tolerated.  I have personally reviewed and noted the following in the patient's chart:   . Medical and social history . Use of alcohol, tobacco or illicit drugs  . Current medications and supplements . Functional ability and status . Nutritional status . Physical activity . Advanced directives . List of other physicians . Vitals . Screenings to include cognitive, depression, and falls . Referrals and appointments  In addition, I have reviewed and discussed with patient certain preventive protocols, quality metrics, and best practice recommendations. A written personalized care plan for preventive services as well as general preventive health recommendations were provided to  patient.     Michiel Cowboy, RN  12/14/2016     Medical  screening examination/treatment/procedure(s) were performed by non-physician practitioner and as supervising physician I was immediately available for consultation/collaboration. I agree with above. [name], MD

## 2016-12-14 NOTE — Patient Instructions (Addendum)
Start doing brain stimulating activities (puzzles, reading, adult coloring books, staying active) to keep memory sharp.   Continue to eat heart healthy diet (full of fruits, vegetables, whole grains, lean protein, water--limit salt, fat, and sugar intake) and increase physical activity as tolerated.   Matthew [name] , Thank you for taking time to come for your Medicare Wellness Visit. I appreciate your ongoing commitment to your health goals. Please review the following plan we discussed and let me know if I can assist you in the future.   These are the goals we discussed: Goals    . Patient Stated     Stay as health and as independent as possible by being active, enjoy life and family.       This is a list of the screening recommended for you and due dates:  Health Maintenance  Topic Date Due  . Flu Shot  08/10/2016  . Tetanus Vaccine  02/01/2022  . Pneumonia vaccines  Completed   Influenza Virus Vaccine (Flucelvax) What is this medicine? INFLUENZA VIRUS VACCINE (in floo EN zuh VAHY ruhs vak SEEN) helps to reduce the risk of getting influenza also known as the flu. The vaccine only helps protect you against some strains of the flu. This medicine may be used for other purposes; ask your health care provider or pharmacist if you have questions. COMMON BRAND NAME(S): FLUCELVAX What should I tell my health care provider before I take this medicine? They need to know if you have any of these conditions: -bleeding disorder like hemophilia -fever or infection -Guillain-Barre syndrome or other neurological problems -immune system problems -infection with the human immunodeficiency virus (HIV) or AIDS -low blood platelet counts -multiple sclerosis -an unusual or allergic reaction to influenza virus vaccine, other medicines, foods, dyes or preservatives -pregnant or trying to get pregnant -breast-feeding How should I use this medicine? This vaccine is for injection into a muscle. It is  given by a health care professional. A copy of Vaccine Information Statements will be given before each vaccination. Read this sheet carefully each time. The sheet may change frequently. Talk to your pediatrician regarding the use of this medicine in children. Special care may be needed. Overdosage: If you think you've taken too much of this medicine contact a poison control center or emergency room at once. Overdosage: If you think you have taken too much of this medicine contact a poison control center or emergency room at once. NOTE: This medicine is only for you. Do not share this medicine with others. What if I miss a dose? This does not apply. What may interact with this medicine? -chemotherapy or radiation therapy -medicines that lower your immune system like etanercept, anakinra, infliximab, and adalimumab -medicines that treat or prevent blood clots like warfarin -phenytoin -steroid medicines like prednisone or cortisone -theophylline -vaccines This list may not describe all possible interactions. Give your health care provider a list of all the medicines, herbs, non-prescription drugs, or dietary supplements you use. Also tell them if you smoke, drink alcohol, or use illegal drugs. Some items may interact with your medicine. What should I watch for while using this medicine? Report any side effects that do not go away within 3 days to your doctor or health care professional. Call your health care provider if any unusual symptoms occur within 6 weeks of receiving this vaccine. You may still catch the flu, but the illness is not usually as bad. You cannot get the flu from the vaccine. The vaccine will not  protect against colds or other illnesses that may cause fever. The vaccine is needed every year. What side effects may I notice from receiving this medicine? Side effects that you should report to your doctor or health care professional as soon as possible: -allergic reactions like  skin rash, itching or hives, swelling of the face, lips, or tongue Side effects that usually do not require medical attention (Report these to your doctor or health care professional if they continue or are bothersome.): -fever -headache -muscle aches and pains -pain, tenderness, redness, or swelling at the injection site -tiredness This list may not describe all possible side effects. Call your doctor for medical advice about side effects. You may report side effects to FDA at 1-800-FDA-1088. Where should I keep my medicine? The vaccine will be given by a health care professional in a clinic, pharmacy, doctor's office, or other health care setting. You will not be given vaccine doses to store at home. NOTE: This sheet is a summary. It may not cover all possible information. If you have questions about this medicine, talk to your doctor, pharmacist, or health care provider.  2018 Elsevier/Gold Standard (2010-12-08 14:06:47) It is important to avoid accidents which may result in broken bones.  Here are a few ideas on how to make your home safer so you will be less likely to trip or fall.  1. Use nonskid mats or non slip strips in your shower or tub, on your bathroom floor and around sinks.  If you know that you have spilled water, wipe it up! 2. In the bathroom, it is important to have properly installed grab bars on the walls or on the edge of the tub.  Towel racks are NOT strong enough for you to hold onto or to pull on for support. 3. Stairs and hallways should have enough light.  Add lamps or night lights if you need ore light. 4. It is good to have handrails on both sides of the stairs if possible.  Always fix broken handrails right away. 5. It is important to see the edges of steps.  Paint the edges of outdoor steps white so you can see them better.  Put colored tape on the edge of inside steps. 6. Throw-rugs are dangerous because they can slide.  Removing the rugs is the best idea, but if  they must stay, add adhesive carpet tape to prevent slipping. 7. Do not keep things on stairs or in the halls.  Remove small furniture that blocks the halls as it may cause you to trip.  Keep telephone and electrical cords out of the way where you walk. 8. Always were sturdy, rubber-soled shoes for good support.  Never wear just socks, especially on the stairs.  Socks may cause you to slip or fall.  Do not wear full-length housecoats as you can easily trip on the bottom.  9. Place the things you use the most on the shelves that are the easiest to reach.  If you use a stepstool, make sure it is in good condition.  If you feel unsteady, DO NOT climb, ask for help. 10. If a health professional advises you to use a cane or walker, do not be ashamed.  These items can keep you from falling and breaking your bones.

## 2016-12-24 NOTE — Progress Notes (Deleted)
Subjective:    Patient ID: Matthew [name], male    DOB: 10/08/1930, 83 y.o.   MRN: [id number]  HPI The patient is here for follow up.  Dementia:  He is following with neurology.  He is taking his medication daily.    Hyperlipidemia: He is taking his medication daily. He is compliant with a low fat/cholesterol diet. He is exercising regularly. He denies myalgias.   Peripheral neuropathy, hereditary:  He is taking tramadol and lyrica.    GERD:  He is taking his medication daily as prescribed.  He denies any GERD symptoms and feels his GERD is well controlled.    Medications and allergies reviewed with patient and updated if appropriate.  Patient Active Problem List   Diagnosis Date Noted  . Nausea without vomiting 06/01/2015  . Periumbilical pain 43/32/9518  . Mild aortic stenosis 05/12/2015  . Abdominal pain 05/12/2015  . Constipation 05/12/2015  . Essential hypertension   . Chest pain 04/30/2015  . CKD (chronic kidney disease) stage 3, GFR 30-59 ml/min (HCC) 04/30/2015  . Pancreatic cyst 02/12/2015  . Hereditary and idiopathic peripheral neuropathy 11/07/2014  . Abnormal EKG 02/16/2012  . Moderate dementia without behavioral disturbance 11/02/2009  . SKIN CANCER, HX OF 11/02/2009  . ANXIETY 02/03/2007  . HEMORRHOIDS, INTERNAL 02/03/2007  . ARTHRITIS 02/03/2007  . SLEEP APNEA 02/03/2007  . Hyperlipidemia 11/13/2006  . PVD 11/13/2006  . PROSTATE CANCER, HX OF 11/13/2006  . KIDNEY DISEASE, CHRONIC NOS 03/27/2006  . 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 COLONIC POLYPS, HYPERPLASTIC 12/24/2003  . HIATAL HERNIA 12/24/2003  . Diverticulosis of large intestine 12/24/2003    Current Outpatient Medications on File Prior to Visit  Medication Sig Dispense Refill  . aspirin EC 81 MG tablet Take 81 mg by mouth daily.    . bisacodyl (DULCOLAX) 5 MG EC tablet Take 5 mg by mouth daily as needed for moderate constipation.    . cyclobenzaprine (FLEXERIL) 5 MG tablet Take 1 tablet  (5 mg total) by mouth 3 (three) times daily as needed for muscle spasms. 30 tablet 0  . dimenhyDRINATE (DRAMAMINE) 50 MG tablet Take 50 mg by mouth every 8 (eight) hours as needed for nausea.    [name] donepezil (ARICEPT) 10 MG tablet Take 1 tablet (10 mg total) by mouth at bedtime. 90 tablet 3  . lidocaine (XYLOCAINE) 5 % ointment Apply 1 application topically as needed for moderate pain. Prescription faxed to [place], 12 refills    . LYRICA 75 MG capsule take 1 capsule by mouth twice a day 60 capsule 3  . memantine (NAMENDA) 10 MG tablet Take 1 tablet (10 mg total) by mouth 2 (two) times daily. 180 tablet 4  . omeprazole (PRILOSEC) 40 MG capsule Take 1 capsule (40 mg total) by mouth daily. 90 capsule 2  . polyethylene glycol (MIRALAX / GLYCOLAX) packet Take 17 g by mouth daily as needed (constipation).     . ranitidine (ZANTAC) 150 MG tablet Take 1 tablet (150 mg total) by mouth at bedtime. 90 tablet 3  . simvastatin (ZOCOR) 40 MG tablet Take 1 tablet (40 mg total) by mouth daily at 6 PM. 90 tablet 3  . traMADol (ULTRAM) 50 MG tablet Take 1 tablet (50 mg total) by mouth 2 (two) times daily. 60 tablet 0   No current facility-administered medications on file prior to visit.     Past Medical History:  Diagnosis Date  . Alzheimer's dementia    "don't know  what stage but it's not early" (04/30/2015)  . Anxiety   . Arthritis   . CAD (coronary artery disease)    a.  mild non obstructive CAD on cath 02/2011  . Cataract    NOS  . Chronic kidney disease   . Diverticulosis of colon   . Dysphagia    PMH of  . Hemorrhoids, internal   . Hiatal hernia   . History of colon polyps 2005   hyperplastic; Dr [name]  . Hyperlipidemia   . Hypertension   . Impotence of organic origin   . Metabolic syndrome X   . Prostate cancer ([place])    Dr [name]  . PVD (peripheral vascular disease) ([place])   . Schatzki's ring   . Skin cancer     Past Surgical History:  Procedure Laterality Date   . APPENDECTOMY  1950  . BACK SURGERY    . CERVICAL FUSION    . COLONOSCOPY W/ POLYPECTOMY  2005   Dr [name]  . EUS N/A 01/15/2015   Procedure: UPPER ENDOSCOPIC ULTRASOUND (EUS) RADIAL;  Surgeon: [name], MD;  Location: [facility] ENDOSCOPY;  Service: Endoscopy;  Laterality: N/A;  . LEFT HEART CATHETERIZATION WITH CORONARY ANGIOGRAM N/A 03/02/2012   Procedure: LEFT HEART CATHETERIZATION WITH CORONARY ANGIOGRAM;  Surgeon: [name], MD;  Location: [facility] CATH LAB;  Service: Cardiovascular;  Laterality: N/A;  . [place]; no chemo or radiation  . SHOULDER SURGERY     right  . TONSILLECTOMY      Social History   Socioeconomic History  . Marital status: Married    Spouse name: [name]  . Number of children: 4  . Years of education: 58  . Highest education level: Not on file  Social Needs  . Financial resource strain: Not hard at all  . Food insecurity - worry: Never true  . Food insecurity - inability: Never true  . Transportation needs - medical: No  . Transportation needs - non-medical: No  Occupational History  . Occupation: Retired    Comment: TEFL teacher  Tobacco Use  . Smoking status: Never Smoker  . Smokeless tobacco: Never Used  Substance and Sexual Activity  . Alcohol use: No    Alcohol/week: 0.0 oz  . Drug use: No  . Sexual activity: Not on file  Other Topics Concern  . Not on file  Social History Narrative   Lives at home with wife.      Family History  Problem Relation Age of Onset  . Lung cancer Father        smoker  . Throat cancer Father   . Kidney disease Father        nephrectomy & partial nephrectomy  . Breast cancer Mother   . Dementia Mother   . Heart attack Mother 30  . Prostate cancer Brother   . Heart disease Brother        x 3 (one brother with CAD in the 59s, another age 23s, third one 12s CABG)  . Diabetes Maternal Grandmother   . Heart attack Maternal Grandmother        in 37s  . Stroke Neg  Hx     Review of Systems     Objective:  There were no vitals filed for this visit. Wt Readings from Last 3 Encounters:  12/14/16 202 lb (91.6 kg)  08/22/16 200 lb (90.7 kg)  06/23/16 203 lb (92.1 kg)   There is no height or weight on  file to calculate BMI.   Physical Exam    Constitutional: Appears well-developed and well-nourished. No distress.  HENT:  Head: Normocephalic and atraumatic.  Neck: Neck supple. No tracheal deviation present. No thyromegaly present.  No cervical lymphadenopathy Cardiovascular: Normal rate, regular rhythm and normal heart sounds.   No murmur heard. No carotid bruit .  No edema Pulmonary/Chest: Effort normal and breath sounds normal. No respiratory distress. No has no wheezes. No rales.  Skin: Skin is warm and dry. Not diaphoretic.  Psychiatric: Normal mood and affect. Behavior is normal.      Assessment & Plan:    See Problem List for Assessment and Plan of chronic medical problems.

## 2016-12-26 ENCOUNTER — Ambulatory Visit: Payer: Medicare Other | Admitting: Internal Medicine

## 2017-01-13 DIAGNOSIS — L723 Sebaceous cyst: Secondary | ICD-10-CM | POA: Diagnosis not present

## 2017-01-13 DIAGNOSIS — L821 Other seborrheic keratosis: Secondary | ICD-10-CM | POA: Diagnosis not present

## 2017-01-13 DIAGNOSIS — Z85820 Personal history of malignant melanoma of skin: Secondary | ICD-10-CM | POA: Diagnosis not present

## 2017-01-13 DIAGNOSIS — D225 Melanocytic nevi of trunk: Secondary | ICD-10-CM | POA: Diagnosis not present

## 2017-02-08 ENCOUNTER — Other Ambulatory Visit: Payer: Self-pay | Admitting: Internal Medicine

## 2017-02-08 NOTE — Telephone Encounter (Signed)
Check [place] registry last filled 12/14/2016.Marland KitchenJohny [name]

## 2017-02-22 ENCOUNTER — Encounter: Payer: Self-pay | Admitting: Diagnostic Neuroimaging

## 2017-02-22 ENCOUNTER — Ambulatory Visit: Payer: Medicare Other | Admitting: Diagnostic Neuroimaging

## 2017-02-22 VITALS — BP 94/58 | HR 60 | Wt 200.6 lb

## 2017-02-22 DIAGNOSIS — F0391 Unspecified dementia with behavioral disturbance: Secondary | ICD-10-CM

## 2017-02-22 DIAGNOSIS — M542 Cervicalgia: Secondary | ICD-10-CM

## 2017-02-22 DIAGNOSIS — G8929 Other chronic pain: Secondary | ICD-10-CM | POA: Diagnosis not present

## 2017-02-22 DIAGNOSIS — G44229 Chronic tension-type headache, not intractable: Secondary | ICD-10-CM

## 2017-02-22 DIAGNOSIS — F03B18 Unspecified dementia, moderate, with other behavioral disturbance: Secondary | ICD-10-CM

## 2017-02-22 MED ORDER — MEMANTINE HCL 10 MG PO TABS: 10.0000 mg | ORAL_TABLET | Freq: Two times a day (BID) | ORAL | 4 refills | Status: DC

## 2017-02-22 MED ORDER — DONEPEZIL HCL 10 MG PO TABS: 10.0000 mg | ORAL_TABLET | Freq: Every day | ORAL | 3 refills | Status: DC

## 2017-02-22 NOTE — Progress Notes (Signed)
[facility]  PATIENT: Matthew [name] DOB: 09-Aug-1930  REFERRING CLINICIAN:  HISTORY FROM: patient, wife REASON FOR VISIT: follow up    HISTORICAL  CHIEF COMPLAINT:  Chief Complaint  Patient presents with  . Dementia    rm 6, wifeSunday Spillers  MMSE 16  . Follow-up    6 month    HISTORY OF PRESENT ILLNESS:   UPDATE (02/22/17, VRP): Since last visit, doing about the same. Tolerating meds. No alleviating or aggravating factors. Continue with HA and neck pain. HA on left side and occipital region. Also with shoulder pain. No nausea, vomiting, photophobia, phonophobia. Uses OTC tylenol and aleve.   UPDATE 08/22/16: Since last visit patient continues to have short-term memory problems, poor insight, interrupted sleep, inverted sleep schedule, as well as intermittent headaches and nausea. Symptoms tend to bother him mainly at nighttime. Patient continues to live with his wife at home. She also has medical problems including balance problems and diabetes. Family is concerned about their ability to live in their own home going forward. Patient continues to drive a car with his wife in the passenger seat. Family is also concerned about driving ability.  UPDATE 08/19/15: Since last visit, overall doing well. Some issues with interrupted sleep at night, but excessive sleep in the day time. Some intermittent frustration. Memory loss stable.   UPDATE 02/19/15: Since last visit, patient doing well. Patient feels fine. Wife thinks pt is stable, but sometimes slightly worse. He is having comprehension / attention problems. Now having non-threatening visual hallucinations. Still driving (only when accompanied by wife). Also with burning feet sensation (on lyrica per PCP; neuropathy labs unremarkable).   UPDATE 07/03/14: Since last visit, lost to follow up. Memory loss is progressing. Patient in denial of memory problems. He stays active working in the yard. Still driving. Wife takes care of  financial issues.   New HPI (02/13/12): 84 year old male with hypercholesteremia, here for evaluation of memory problems. Past one to 2 years patient has developed progressive short-term memory problems. Patient denies any significant memory problems. Patient's wife has noticed progressive problems including remembering tasks, conversations, recent events. He does repeat himself frequent. Having difficulty taking his medications as well as helping his wife take her medications. As outpatient previously for headache and neck pain. The patient referred for a different problem.  PRIOR HPI (05/16/11): 84 year old right-handed male with hypercholesterolemia, here for evaluation of headaches. Patient reports history of cervical spine surgery in the 1980s, following which he developed chronic headaches. He thinks that his headaches are generated from his chronic neck pain. Headaches have been worse the last 6-12 months. He has seen multiple neurologists in the past. Is not sure what medications he can treated with. He describes a pain in the vertex of his head, throbbing sensation, without nausea or vomiting. He does have photophobia and phonophobia. He has tried tramadol without relief.   REVIEW OF SYSTEMS: Full 14 system review of systems performed and negative except: headache confusion joint pain back pain fatigue chest tightness abd pain insomnia.    ALLERGIES: No Known Allergies  HOME MEDICATIONS: Outpatient Medications Prior to Visit  Medication Sig Dispense Refill  . aspirin EC 81 MG tablet Take 81 mg by mouth daily.    . bisacodyl (DULCOLAX) 5 MG EC tablet Take 5 mg by mouth daily as needed for moderate constipation.    . cyclobenzaprine (FLEXERIL) 5 MG tablet Take 1 tablet (5 mg total) by mouth 3 (three) times daily as needed for muscle  spasms. 30 tablet 0  . dimenhyDRINATE (DRAMAMINE) 50 MG tablet Take 50 mg by mouth every 8 (eight) hours as needed for nausea.    [name] donepezil (ARICEPT) 10 MG  tablet Take 1 tablet (10 mg total) by mouth at bedtime. 90 tablet 3  . lidocaine (XYLOCAINE) 5 % ointment Apply 1 application topically as needed for moderate pain. Prescription faxed to [place], 12 refills    . LYRICA 75 MG capsule take 1 capsule by mouth twice a day 60 capsule 3  . memantine (NAMENDA) 10 MG tablet Take 1 tablet (10 mg total) by mouth 2 (two) times daily. 180 tablet 4  . omeprazole (PRILOSEC) 40 MG capsule Take 1 capsule (40 mg total) by mouth daily. 90 capsule 2  . polyethylene glycol (MIRALAX / GLYCOLAX) packet Take 17 g by mouth daily as needed (constipation).     . ranitidine (ZANTAC) 150 MG tablet Take 1 tablet (150 mg total) by mouth at bedtime. 90 tablet 3  . simvastatin (ZOCOR) 40 MG tablet Take 1 tablet (40 mg total) by mouth daily at 6 PM. 90 tablet 3  . traMADol (ULTRAM) 50 MG tablet take 1 tablet by mouth twice a day 60 tablet 0   No facility-administered medications prior to visit.     PAST MEDICAL HISTORY: Past Medical History:  Diagnosis Date  . Alzheimer's dementia    "don't know what stage but it's not early" (04/30/2015)  . Anxiety   . Arthritis   . CAD (coronary artery disease)    a.  mild non obstructive CAD on cath 02/2011  . Cataract    NOS  . Chronic kidney disease   . Diverticulosis of colon   . Dysphagia    PMH of  . Hemorrhoids, internal   . Hiatal hernia   . History of colon polyps 2005   hyperplastic; Dr [name]  . Hyperlipidemia   . Hypertension   . Impotence of organic origin   . Metabolic syndrome X   . Prostate cancer ([place])    Dr [name]  . PVD (peripheral vascular disease) ([place])   . Schatzki's ring   . Skin cancer     PAST SURGICAL HISTORY: Past Surgical History:  Procedure Laterality Date  . APPENDECTOMY  1950  . BACK SURGERY    . CERVICAL FUSION    . COLONOSCOPY W/ POLYPECTOMY  2005   Dr [name]  . EUS N/A 01/15/2015   Procedure: UPPER ENDOSCOPIC ULTRASOUND (EUS) RADIAL;  Surgeon: [name], MD;  Location: [facility] ENDOSCOPY;  Service: Endoscopy;  Laterality: N/A;  . LEFT HEART CATHETERIZATION WITH CORONARY ANGIOGRAM N/A 03/02/2012   Procedure: LEFT HEART CATHETERIZATION WITH CORONARY ANGIOGRAM;  Surgeon: [name], MD;  Location: [facility] CATH LAB;  Service: Cardiovascular;  Laterality: N/A;  . [place]; no chemo or radiation  . SHOULDER SURGERY     right  . TONSILLECTOMY      FAMILY HISTORY: Family History  Problem Relation Age of Onset  . Lung cancer Father        smoker  . Throat cancer Father   . Kidney disease Father        nephrectomy & partial nephrectomy  . Breast cancer Mother   . Dementia Mother   . Heart attack Mother 50  . Prostate cancer Brother   . Heart disease Brother        x 3 (one brother with CAD in the 39s, another age  62s, third one 70s CABG)  . Diabetes Maternal Grandmother   . Heart attack Maternal Grandmother        in 26s  . Stroke Neg Hx     SOCIAL HISTORY:  Social History   Socioeconomic History  . Marital status: Married    Spouse name: [name]  . Number of children: 4  . Years of education: 68  . Highest education level: Not on file  Social Needs  . Financial resource strain: Not hard at all  . Food insecurity - worry: Never true  . Food insecurity - inability: Never true  . Transportation needs - medical: No  . Transportation needs - non-medical: No  Occupational History  . Occupation: Retired    Comment: TEFL teacher  Tobacco Use  . Smoking status: Never Smoker  . Smokeless tobacco: Never Used  Substance and Sexual Activity  . Alcohol use: No    Alcohol/week: 0.0 oz  . Drug use: No  . Sexual activity: Not on file  Other Topics Concern  . Not on file  Social History Narrative   Lives at home with wife.      PHYSICAL EXAM  GENERAL EXAM/CONSTITUTIONAL: Vitals:  Vitals:   02/22/17 1111  BP: (!) 94/58  Pulse: 60  Weight: 200 lb 9.6 oz (91 kg)   Body mass index is  29.62 kg/m. No exam data present  Patient is in no distress; well developed, nourished and groomed; neck is supple  CARDIOVASCULAR:  Examination of carotid arteries is normal; no carotid bruits  Regular rate and rhythm, no murmurs  Examination of peripheral vascular system by observation and palpation is normal  EYES:  Ophthalmoscopic exam of optic discs and posterior segments is normal; no papilledema or hemorrhages  MUSCULOSKELETAL:  Gait, strength, tone, movements noted in Neurologic exam below  NEUROLOGIC: MENTAL STATUS:  MMSE - [place] Exam 02/22/2017 12/14/2016 08/22/2016  Not completed: - Refused -  Orientation to time 0 - 1  Orientation to Place 4 - 3  Registration 3 - 3  Attention/ Calculation 0 - 1  Recall 2 - 0  Language- name 2 objects 2 - 2  Language- repeat 0 - 1  Language- follow 3 step command 3 - 3  Language- read & follow direction 1 - 1  Write a sentence 1 - 1  Copy design 0 - 0  Total score 16 - 16    awake, alert, oriented to person  [place]  language fluent, comprehension intact, naming intact,   fund of knowledge appropriate  JOVIAL, SMILING, PLEASANT  CRANIAL NERVE:   2nd, 3rd, 4th, 6th - pupils equal and reactive to light, visual fields full to confrontation, extraocular muscles intact, no nystagmus  5th - facial sensation symmetric  7th - facial strength symmetric  8th - hearing intact  9th - palate elevates symmetrically, uvula midline  11th - shoulder shrug symmetric  12th - tongue protrusion midline  MOTOR:   normal bulk and tone, full strength in the BUE, BLE  SENSORY:   normal and symmetric to light touch  COORDINATION:   finger-nose-finger, fine finger movements normal  REFLEXES:   deep tendon reflexes present and symmetric  GAIT/STATION:   narrow based gait; SLOW AND CAUTIOUS    DIAGNOSTIC DATA (LABS, IMAGING, TESTING) - I reviewed patient records, labs, notes,  testing and imaging myself where available.  Lab Results  Component Value Date   WBC 6.3 06/28/2016   HGB 14.5 06/28/2016  HCT 43.6 06/28/2016   MCV 90.2 06/28/2016   PLT 162.0 06/28/2016      Component Value Date/Time   NA 140 06/28/2016 1108   K 4.3 06/28/2016 1108   CL 105 06/28/2016 1108   CO2 29 06/28/2016 1108   GLUCOSE 109 (H) 06/28/2016 1108   BUN 20 06/28/2016 1108   CREATININE 1.50 06/28/2016 1108   CALCIUM 9.3 06/28/2016 1108   PROT 6.6 06/28/2016 1108   ALBUMIN 4.3 06/28/2016 1108   AST 13 06/28/2016 1108   ALT 10 06/28/2016 1108   ALKPHOS 40 06/28/2016 1108   BILITOT 0.6 06/28/2016 1108   GFRNONAA 41 (L) 05/18/2015 1515   GFRAA 47 (L) 05/18/2015 1515   Lab Results  Component Value Date   CHOL 167 06/28/2016   HDL 57.90 06/28/2016   LDLCALC 77 06/28/2016   TRIG 163.0 (H) 06/28/2016   CHOLHDL 3 06/28/2016   Lab Results  Component Value Date   HGBA1C 5.9 02/02/2012   Lab Results  Component Value Date   VITAMINB12 249 09/24/2014   Lab Results  Component Value Date   TSH 2.10 06/28/2016    05/31/11 MRI brain  1. Mild perisylvian atrophy. 2. Mild non-specific periventricular gliosis.    ASSESSMENT AND PLAN  84 y.o. year old male here with progressive memory loss, consistent with dementia. Also with some visual hallucinations. May represent dementia with lewy bodies vs alzheimer's dementia.   Dx:  Moderate dementia with behavioral disturbance  Chronic neck pain  Chronic tension-type headache, not intractable    PLAN:  - diagnosis, prognois and treatment options reviewed  - continue donepezil + memantine; will refill today and request future refills per PCP - safety issues reviewed (no driving; caution with finances, home appliances, wandering) - ibuprofen and tylenol as needed for headache, neck pain, shoulder pain - return to PCP; follow up in neurology clinic as needed  Meds ordered this encounter  Medications  . donepezil  (ARICEPT) 10 MG tablet    Sig: Take 1 tablet (10 mg total) by mouth at bedtime.    Dispense:  90 tablet    Refill:  3  . memantine (NAMENDA) 10 MG tablet    Sig: Take 1 tablet (10 mg total) by mouth 2 (two) times daily.    Dispense:  180 tablet    Refill:  4   Return if symptoms worsen or fail to improve, for return to PCP.    [name], MD 8/88/9169, 45:03 AM Certified in Neurology, Neurophysiology and Neuroimaging  [facility] [address], [place], [place] [address] [phone]

## 2017-03-02 ENCOUNTER — Other Ambulatory Visit: Payer: Self-pay | Admitting: Internal Medicine

## 2017-03-03 NOTE — Telephone Encounter (Signed)
[place] Controlled Substance Database checked. Last filled on 02/08/17

## 2017-03-06 ENCOUNTER — Other Ambulatory Visit: Payer: Self-pay | Admitting: Internal Medicine

## 2017-03-06 NOTE — Telephone Encounter (Signed)
[place] Controlled Substance Database checked. Last filled on 02/01/17 

## 2017-03-30 NOTE — Progress Notes (Signed)
Subjective:    Patient ID: Matthew [name], male    DOB: August 03, 1930, 84 y.o.   MRN: [id number]  HPI The patient is here for follow up.  Hyperlipidemia: He is taking his medication daily. He is compliant with a low fat/cholesterol diet. He is not exercising regularly. He denies myalgias.   Peripheral neuropathy:  He is taking lyrica and tramadol daily.  This is controlling his neuropathy pain overall.  Dementia:  He is taking aricept and namenda daily.  He has seen neurology, but no longer needs to see him.  I will prescribe the medications.   Headaches:  He has been having headaches.  He has had them for years and there is no known cause.  He has pain on the top of his head.  Neurology wants him to take tramadol for the headaches and will need more tramadol, which is effective - sometimes he has to take two.   GERD:  He is taking his medication daily as prescribed.  He denies any GERD symptoms and feels his GERD is well controlled.   CKD: He does not take any NSAIDs.  He drinks some water throughout the day, but could do better.  BRBPR, constipation:  He has constipation and the other day he had an episodic of right red blood per rectum.  He has known hemorrhoids.  He had a second episode.    Medications and allergies reviewed with patient and updated if appropriate.  Patient Active Problem List   Diagnosis Date Noted  . Mild aortic stenosis 05/12/2015  . Abdominal pain 05/12/2015  . Constipation 05/12/2015  . Essential hypertension   . Chest pain 04/30/2015  . CKD (chronic kidney disease) stage 3, GFR 30-59 ml/min (HCC) 04/30/2015  . Pancreatic cyst 02/12/2015  . Hereditary and idiopathic peripheral neuropathy 11/07/2014  . Moderate dementia without behavioral disturbance 11/02/2009  . SKIN CANCER, HX OF 11/02/2009  . ANXIETY 02/03/2007  . HEMORRHOIDS, INTERNAL 02/03/2007  . ARTHRITIS 02/03/2007  . SLEEP APNEA 02/03/2007  . Hyperlipidemia 11/13/2006  . PVD 11/13/2006  .  PROSTATE CANCER, HX OF 11/13/2006  . COLONIC POLYPS, HYPERPLASTIC 12/24/2003  . HIATAL HERNIA 12/24/2003  . Diverticulosis of large intestine 12/24/2003    Current Outpatient Medications on File Prior to Visit  Medication Sig Dispense Refill  . aspirin EC 81 MG tablet Take 81 mg by mouth daily.    . bisacodyl (DULCOLAX) 5 MG EC tablet Take 5 mg by mouth daily as needed for moderate constipation.    . cyclobenzaprine (FLEXERIL) 5 MG tablet Take 1 tablet (5 mg total) by mouth 3 (three) times daily as needed for muscle spasms. 30 tablet 0  . dimenhyDRINATE (DRAMAMINE) 50 MG tablet Take 50 mg by mouth every 8 (eight) hours as needed for nausea.    [name] donepezil (ARICEPT) 10 MG tablet Take 1 tablet (10 mg total) by mouth at bedtime. 90 tablet 3  . lidocaine (XYLOCAINE) 5 % ointment Apply 1 application topically as needed for moderate pain. Prescription faxed to [facility], 12 refills    . memantine (NAMENDA) 10 MG tablet Take 1 tablet (10 mg total) by mouth 2 (two) times daily. 180 tablet 4  . omeprazole (PRILOSEC) 40 MG capsule Take 1 capsule (40 mg total) by mouth daily. 90 capsule 2  . polyethylene glycol (MIRALAX / GLYCOLAX) packet Take 17 g by mouth daily as needed (constipation).     . pregabalin (LYRICA) 75 MG capsule Take 1 capsule (75  mg total) by mouth 2 (two) times daily. -- Office visit needed for further refills 60 capsule 0  . ranitidine (ZANTAC) 150 MG tablet Take 1 tablet (150 mg total) by mouth at bedtime. 90 tablet 3  . simvastatin (ZOCOR) 40 MG tablet Take 1 tablet (40 mg total) by mouth daily at 6 PM. 90 tablet 3  . traMADol (ULTRAM) 50 MG tablet take 1 tablet by mouth twice a day 60 tablet 0   No current facility-administered medications on file prior to visit.     Past Medical History:  Diagnosis Date  . Alzheimer's dementia    "don't know what stage but it's not early" (04/30/2015)  . Anxiety   . Arthritis   . CAD (coronary artery disease)    a.  mild non  obstructive CAD on cath 02/2011  . Cataract    NOS  . Chronic kidney disease   . Diverticulosis of colon   . Dysphagia    PMH of  . Hemorrhoids, internal   . Hiatal hernia   . History of colon polyps 2005   hyperplastic; Dr [name]  . Hyperlipidemia   . Hypertension   . Impotence of organic origin   . Metabolic syndrome X   . Prostate cancer ([place])    Dr [name]  . PVD (peripheral vascular disease) ([place])   . Schatzki's ring   . Skin cancer     Past Surgical History:  Procedure Laterality Date  . APPENDECTOMY  1950  . BACK SURGERY    . CERVICAL FUSION    . COLONOSCOPY W/ POLYPECTOMY  2005   Dr [name]  . EUS N/A 01/15/2015   Procedure: UPPER ENDOSCOPIC ULTRASOUND (EUS) RADIAL;  Surgeon: [name], MD;  Location: [facility] ENDOSCOPY;  Service: Endoscopy;  Laterality: N/A;  . LEFT HEART CATHETERIZATION WITH CORONARY ANGIOGRAM N/A 03/02/2012   Procedure: LEFT HEART CATHETERIZATION WITH CORONARY ANGIOGRAM;  Surgeon: [name], MD;  Location: [facility] CATH LAB;  Service: Cardiovascular;  Laterality: N/A;  . [place]; no chemo or radiation  . SHOULDER SURGERY     right  . TONSILLECTOMY      Social History   Socioeconomic History  . Marital status: Married    Spouse name: [name]  . Number of children: 4  . Years of education: 44  . Highest education level: Not on file  Occupational History  . Occupation: Retired    Comment: TEFL teacher  Social Needs  . Financial resource strain: Not hard at all  . Food insecurity:    Worry: Never true    Inability: Never true  . Transportation needs:    Medical: No    Non-medical: No  Tobacco Use  . Smoking status: Never Smoker  . Smokeless tobacco: Never Used  Substance and Sexual Activity  . Alcohol use: No    Alcohol/week: 0.0 oz  . Drug use: No  . Sexual activity: Not on file  Lifestyle  . Physical activity:    Days per week: 0 days    Minutes per session: 0 min  . Stress: Not  at all  Relationships  . Social connections:    Talks on phone: More than three times a week    Gets together: More than three times a week    Attends religious service: Not on file    Active member of club or organization: Not on file    Attends meetings of clubs or organizations: Not on file  Relationship status: Married  Other Topics Concern  . Not on file  Social History Narrative   Lives at home with wife.      Family History  Problem Relation Age of Onset  . Lung cancer Father        smoker  . Throat cancer Father   . Kidney disease Father        nephrectomy & partial nephrectomy  . Breast cancer Mother   . Dementia Mother   . Heart attack Mother 48  . Prostate cancer Brother   . Heart disease Brother        x 3 (one brother with CAD in the 11s, another age 55s, third one 83s CABG)  . Diabetes Maternal Grandmother   . Heart attack Maternal Grandmother        in 62s  . Stroke Neg Hx     Review of Systems  Constitutional: Negative for chills and fever.  Respiratory: Negative for cough, shortness of breath and wheezing.   Cardiovascular: Positive for leg swelling (occ). Negative for chest pain and palpitations.  Gastrointestinal: Positive for blood in stool, constipation and nausea.       No gerd  Neurological: Positive for headaches. Negative for light-headedness.       Objective:   Vitals:   03/31/17 1439  BP: 116/72  Pulse: 62  Resp: 16  Temp: 97.8 F (36.6 C)  SpO2: 95%   BP Readings from Last 3 Encounters:  03/31/17 116/72  02/22/17 (!) 94/58  12/14/16 (!) 146/60   Wt Readings from Last 3 Encounters:  03/31/17 197 lb (89.4 kg)  02/22/17 200 lb 9.6 oz (91 kg)  12/14/16 202 lb (91.6 kg)   Body mass index is 29.09 kg/m.   Physical Exam    Constitutional: Appears well-developed and well-nourished. No distress.  HENT:  Head: Normocephalic and atraumatic.  Neck: Neck supple. No tracheal deviation present. No thyromegaly present.  No  cervical lymphadenopathy Cardiovascular: Normal rate, regular rhythm and normal heart sounds.   No murmur heard. No carotid bruit .  No edema Pulmonary/Chest: Effort normal and breath sounds normal. No respiratory distress. No has no wheezes. No rales.  Skin: Skin is warm and dry. Not diaphoretic.  Psychiatric: Normal mood and affect. Behavior is normal.      Assessment & Plan:    See Problem List for Assessment and Plan of chronic medical problems.

## 2017-03-30 NOTE — Patient Instructions (Addendum)
  Test(s) ordered today. Your results will be released to [facility] (or called to you) after review, usually within 72hours after test completion. If any changes need to be made, you will be notified at that same time.   Medications reviewed and updated.  Changes include:   Trying colace for a stool softener.  Take 1-3 pills a day.  Try miralax daily or as needed.     Your prescription(s) have been submitted to your pharmacy. Please take as directed and contact our office if you believe you are having problem(s) with the medication(s).  Please followup in 6 months

## 2017-03-31 ENCOUNTER — Other Ambulatory Visit (INDEPENDENT_AMBULATORY_CARE_PROVIDER_SITE_OTHER): Payer: Medicare Other

## 2017-03-31 ENCOUNTER — Ambulatory Visit: Payer: Medicare Other | Admitting: Internal Medicine

## 2017-03-31 ENCOUNTER — Encounter: Payer: Self-pay | Admitting: Internal Medicine

## 2017-03-31 VITALS — BP 116/72 | HR 62 | Temp 97.8°F | Resp 16 | Wt 197.0 lb

## 2017-03-31 DIAGNOSIS — G8929 Other chronic pain: Secondary | ICD-10-CM

## 2017-03-31 DIAGNOSIS — R51 Headache: Secondary | ICD-10-CM | POA: Diagnosis not present

## 2017-03-31 DIAGNOSIS — N183 Chronic kidney disease, stage 3 unspecified: Secondary | ICD-10-CM

## 2017-03-31 DIAGNOSIS — E7849 Other hyperlipidemia: Secondary | ICD-10-CM

## 2017-03-31 DIAGNOSIS — K219 Gastro-esophageal reflux disease without esophagitis: Secondary | ICD-10-CM | POA: Diagnosis not present

## 2017-03-31 DIAGNOSIS — F03B0 Unspecified dementia, moderate, without behavioral disturbance, psychotic disturbance, mood disturbance, and anxiety: Secondary | ICD-10-CM

## 2017-03-31 DIAGNOSIS — K5900 Constipation, unspecified: Secondary | ICD-10-CM

## 2017-03-31 DIAGNOSIS — K625 Hemorrhage of anus and rectum: Secondary | ICD-10-CM

## 2017-03-31 DIAGNOSIS — G609 Hereditary and idiopathic neuropathy, unspecified: Secondary | ICD-10-CM

## 2017-03-31 DIAGNOSIS — I10 Essential (primary) hypertension: Secondary | ICD-10-CM

## 2017-03-31 DIAGNOSIS — F0390 Unspecified dementia without behavioral disturbance: Secondary | ICD-10-CM

## 2017-03-31 LAB — CBC WITH DIFFERENTIAL/PLATELET
BASOS ABS: 0 10*3/uL (ref 0.0–0.1)
Basophils Relative: 0.7 % (ref 0.0–3.0)
EOS PCT: 1.3 % (ref 0.0–5.0)
Eosinophils Absolute: 0.1 10*3/uL (ref 0.0–0.7)
HEMATOCRIT: 42.6 % (ref 39.0–52.0)
Hemoglobin: 14.1 g/dL (ref 13.0–17.0)
LYMPHS PCT: 30.7 % (ref 12.0–46.0)
Lymphs Abs: 1.7 10*3/uL (ref 0.7–4.0)
MCHC: 33.1 g/dL (ref 30.0–36.0)
MCV: 90.3 fl (ref 78.0–100.0)
MONOS PCT: 9 % (ref 3.0–12.0)
Monocytes Absolute: 0.5 10*3/uL (ref 0.1–1.0)
NEUTROS ABS: 3.2 10*3/uL (ref 1.4–7.7)
Neutrophils Relative %: 58.3 % (ref 43.0–77.0)
Platelets: 170 10*3/uL (ref 150.0–400.0)
RBC: 4.72 Mil/uL (ref 4.22–5.81)
RDW: 13.4 % (ref 11.5–15.5)
WBC: 5.5 10*3/uL (ref 4.0–10.5)

## 2017-03-31 LAB — COMPREHENSIVE METABOLIC PANEL
ALT: 8 U/L (ref 0–53)
AST: 12 U/L (ref 0–37)
Albumin: 4 g/dL (ref 3.5–5.2)
Alkaline Phosphatase: 39 U/L (ref 39–117)
BUN: 23 mg/dL (ref 6–23)
CALCIUM: 9.2 mg/dL (ref 8.4–10.5)
CHLORIDE: 106 meq/L (ref 96–112)
CO2: 27 meq/L (ref 19–32)
Creatinine, Ser: 1.52 mg/dL — ABNORMAL HIGH (ref 0.40–1.50)
GFR: 46.54 mL/min — AB (ref >60.00)
Glucose, Bld: 113 mg/dL — ABNORMAL HIGH (ref 70–99)
POTASSIUM: 4.8 meq/L (ref 3.5–5.1)
Sodium: 143 mEq/L (ref 135–145)
Total Bilirubin: 0.3 mg/dL (ref 0.2–1.2)
Total Protein: 6.6 g/dL (ref 6.0–8.3)

## 2017-03-31 LAB — TSH: TSH: 1.81 u[IU]/mL (ref 0.35–4.50)

## 2017-03-31 LAB — LIPID PANEL
CHOL/HDL RATIO: 3
Cholesterol: 148 mg/dL (ref 0–200)
HDL: 55.6 mg/dL (ref >39.00)
LDL CALC: 62 mg/dL (ref 0–99)
NonHDL: 91.91
Triglycerides: 149 mg/dL (ref 0.0–149.0)
VLDL: 29.8 mg/dL (ref 0.0–40.0)

## 2017-03-31 MED ORDER — TRAMADOL HCL 50 MG PO TABS: 50.0000 mg | ORAL_TABLET | Freq: Three times a day (TID) | ORAL | 0 refills | Status: DC | PRN

## 2017-03-31 MED ORDER — PREGABALIN 75 MG PO CAPS: 75.0000 mg | ORAL_CAPSULE | Freq: Two times a day (BID) | ORAL | 5 refills | Status: DC

## 2017-03-31 NOTE — Assessment & Plan Note (Signed)
BP well controlled Current regimen effective and well tolerated Continue current medications at current doses cmp  

## 2017-04-01 ENCOUNTER — Encounter: Payer: Self-pay | Admitting: Internal Medicine

## 2017-04-01 DIAGNOSIS — R519 Headache, unspecified: Secondary | ICD-10-CM | POA: Insufficient documentation

## 2017-04-01 DIAGNOSIS — K219 Gastro-esophageal reflux disease without esophagitis: Secondary | ICD-10-CM | POA: Insufficient documentation

## 2017-04-01 DIAGNOSIS — K625 Hemorrhage of anus and rectum: Secondary | ICD-10-CM | POA: Insufficient documentation

## 2017-04-01 DIAGNOSIS — G8929 Other chronic pain: Secondary | ICD-10-CM | POA: Insufficient documentation

## 2017-04-01 DIAGNOSIS — R51 Headache: Secondary | ICD-10-CM

## 2017-04-01 NOTE — Assessment & Plan Note (Signed)
He had 2 episodes of rectal bleeding, which he has had in the past related to hemorrhoids He has known internal hemorrhoids He does have chronic constipation and is often straining in these 2 episodes occurred in the setting of constipation and straining Check CBC Will control constipation more-stool softener and/or MiraLAX daily We will hold off on GI referral given that he is not a good candidate for colonoscopy and most likely this is not needed since this is likely hemorrhoidal in nature.  If bleeding continues or worsens will refer to GI

## 2017-04-01 NOTE — Assessment & Plan Note (Signed)
Chronic Taking Lyrica and tramadol Overall controlled Continue above

## 2017-04-01 NOTE — Assessment & Plan Note (Signed)
GERD controlled Continue daily medication  

## 2017-04-01 NOTE — Assessment & Plan Note (Signed)
Continue statin Check lipid panel, CMP, TSH

## 2017-04-01 NOTE — Assessment & Plan Note (Signed)
Does not take any NSAIDs Encouraged increasing fluid intake CMP today

## 2017-04-01 NOTE — Assessment & Plan Note (Signed)
Stable Has seen neurology in the past, but currently not following with them We will continue current medications and I will prescribe them

## 2017-04-01 NOTE — Assessment & Plan Note (Signed)
Has been evaluated by neurology-no cause known Pain on top of head occurs daily Taking tramadol 1-2 pills as needed

## 2017-04-01 NOTE — Assessment & Plan Note (Signed)
Chronic Not ideally controlled Currently taking Dulcolax 3 times a week Advised taking a stool softener and/or MiraLAX on a daily basis to prevent constipation

## 2017-05-05 ENCOUNTER — Other Ambulatory Visit: Payer: Self-pay | Admitting: Internal Medicine

## 2017-05-05 NOTE — Telephone Encounter (Signed)
Done erx 

## 2017-06-28 ENCOUNTER — Other Ambulatory Visit: Payer: Self-pay | Admitting: Internal Medicine

## 2017-07-08 ENCOUNTER — Other Ambulatory Visit: Payer: Self-pay | Admitting: Internal Medicine

## 2017-07-10 NOTE — Telephone Encounter (Signed)
[place] Controlled Substance Database checked. Last filled on 06/08/17 

## 2017-07-14 ENCOUNTER — Other Ambulatory Visit: Payer: Self-pay | Admitting: Internal Medicine

## 2017-08-11 ENCOUNTER — Other Ambulatory Visit: Payer: Self-pay | Admitting: Internal Medicine

## 2017-08-11 NOTE — Telephone Encounter (Signed)
[place] Controlled Substance Database checked. Last filled on 0701/19 

## 2017-08-12 ENCOUNTER — Encounter (HOSPITAL_COMMUNITY): Payer: Self-pay | Admitting: Emergency Medicine

## 2017-08-12 ENCOUNTER — Ambulatory Visit (HOSPITAL_COMMUNITY)
Admission: EM | Admit: 2017-08-12 | Discharge: 2017-08-12 | Disposition: A | Discharge status: Home / Self Care | Payer: Medicare Other | Attending: Family Medicine | Admitting: Family Medicine

## 2017-08-12 DIAGNOSIS — R519 Headache, unspecified: Secondary | ICD-10-CM

## 2017-08-12 DIAGNOSIS — R51 Headache: Secondary | ICD-10-CM

## 2017-08-12 MED ORDER — PREDNISONE 20 MG PO TABS: 40.0000 mg | ORAL_TABLET | Freq: Every day | ORAL | 0 refills | Status: AC

## 2017-08-12 MED ORDER — MOMETASONE FUROATE 50 MCG/ACT NA SUSP: 2.0000 | Freq: Every day | NASAL | 12 refills | Status: DC

## 2017-08-12 NOTE — Discharge Instructions (Signed)
Start prednisone as directed. I have called in nasonex, but you can also use over the counter flonase for nasacort for possible eustachian tube discussion.  Take ibuprofen/Tylenol for pain.  Follow-up with neurology for further evaluation if symptoms not improving.  If experiencing worsening symptoms, nausea, vomiting, blurry vision, weakness, dizziness, passing out, confusion, fever, go to the emergency department for further evaluation.

## 2017-08-12 NOTE — ED Provider Notes (Signed)
[facility]    CSN: [id number] Arrival date & time: 08/12/17  1710     History   Chief Complaint Chief Complaint  Patient presents with  . Otalgia  . Headache    HPI Matthew [name] is a 84 y.o. male.   84 year old male with history of dementia, CAD, chronic headache, HLD, HTN comes in with family member for 1 day history of headache and ear pain.  He denies worse headache of his life, thunderclap headache.  States pain is frontal, gradually worsening.  He has bilateral ear pain without drainage.  Denies cough, congestion, sore throat.  Denies fever, chills, night sweats.  Patient with history of dementia, however has not been more confused.  Denies nausea/vomiting.  Denies weakness, dizziness, syncope.  Took tramadol without relief.     Past Medical History:  Diagnosis Date  . Alzheimer's dementia    "don't know what stage but it's not early" (04/30/2015)  . Anxiety   . Arthritis   . CAD (coronary artery disease)    a.  mild non obstructive CAD on cath 02/2011  . Cataract    NOS  . Chronic kidney disease   . Diverticulosis of colon   . Dysphagia    PMH of  . Hemorrhoids, internal   . Hiatal hernia   . History of colon polyps 2005   hyperplastic; Dr [name]  . Hyperlipidemia   . Hypertension   . Impotence of organic origin   . Metabolic syndrome X   . Prostate cancer ([place])    Dr [name]  . PVD (peripheral vascular disease) ([place])   . Schatzki's ring   . Skin cancer     Patient Active Problem List   Diagnosis Date Noted  . GERD (gastroesophageal reflux disease) 04/01/2017  . Rectal bleeding 04/01/2017  . Chronic headaches 04/01/2017  . Mild aortic stenosis 05/12/2015  . Constipation 05/12/2015  . Essential hypertension   . Chest pain 04/30/2015  . CKD (chronic kidney disease) stage 3, GFR 30-59 ml/min (HCC) 04/30/2015  . Pancreatic cyst 02/12/2015  . Hereditary and idiopathic peripheral neuropathy 11/07/2014  . Moderate dementia without  behavioral disturbance 11/02/2009  . SKIN CANCER, HX OF 11/02/2009  . ANXIETY 02/03/2007  . HEMORRHOIDS, INTERNAL 02/03/2007  . ARTHRITIS 02/03/2007  . SLEEP APNEA 02/03/2007  . Hyperlipidemia 11/13/2006  . PVD 11/13/2006  . PROSTATE CANCER, HX OF 11/13/2006  . COLONIC POLYPS, HYPERPLASTIC 12/24/2003  . HIATAL HERNIA 12/24/2003  . Diverticulosis of large intestine 12/24/2003    Past Surgical History:  Procedure Laterality Date  . APPENDECTOMY  1950  . BACK SURGERY    . CERVICAL FUSION    . COLONOSCOPY W/ POLYPECTOMY  2005   Dr [name]  . EUS N/A 01/15/2015   Procedure: UPPER ENDOSCOPIC ULTRASOUND (EUS) RADIAL;  Surgeon: [name], MD;  Location: [facility] ENDOSCOPY;  Service: Endoscopy;  Laterality: N/A;  . LEFT HEART CATHETERIZATION WITH CORONARY ANGIOGRAM N/A 03/02/2012   Procedure: LEFT HEART CATHETERIZATION WITH CORONARY ANGIOGRAM;  Surgeon: [name], MD;  Location: [facility] CATH LAB;  Service: Cardiovascular;  Laterality: N/A;  . [place]; no chemo or radiation  . SHOULDER SURGERY     right  . TONSILLECTOMY         Home Medications    Prior to Admission medications   Medication Sig Start Date End Date Taking? Authorizing Provider  aspirin EC 81 MG tablet Take 81 mg by mouth daily.    [provider]  bisacodyl (DULCOLAX) 5 MG EC tablet Take 5 mg by mouth daily as needed for moderate constipation.    [provider]  cyclobenzaprine (FLEXERIL) 5 MG tablet Take 1 tablet (5 mg total) by mouth 3 (three) times daily as needed for muscle spasms. 06/27/16   [name], MD  dimenhyDRINATE (DRAMAMINE) 50 MG tablet Take 50 mg by mouth every 8 (eight) hours as needed for nausea.    [provider]  donepezil (ARICEPT) 10 MG tablet Take 1 tablet (10 mg total) by mouth at bedtime. 02/22/17   Penumalli, Earlean Polka, MD  lidocaine (XYLOCAINE) 5 % ointment Apply 1 application topically as needed for moderate pain. Prescription faxed  to [facility], 12 refills 02/19/15   [provider]  memantine (NAMENDA) 10 MG tablet Take 1 tablet (10 mg total) by mouth 2 (two) times daily. 02/22/17   Penumalli, Earlean Polka, MD  mometasone (NASONEX) 50 MCG/ACT nasal spray Place 2 sprays into the nose daily. 08/12/17   Tasia Catchings, Amy V, PA-C  omeprazole (PRILOSEC) 40 MG capsule Take 1 capsule (40 mg total) by mouth daily. 09/30/16   [name], MD  polyethylene glycol (MIRALAX / Floria Raveling) packet Take 17 g by mouth daily as needed (constipation).     [provider]  predniSONE (DELTASONE) 20 MG tablet Take 2 tablets (40 mg total) by mouth daily for 5 days. 08/12/17 08/17/17  Tasia Catchings, Amy V, PA-C  pregabalin (LYRICA) 75 MG capsule Take 1 capsule (75 mg total) by mouth 2 (two) times daily. 03/31/17   [name], MD  ranitidine (ZANTAC) 150 MG tablet TAKE 1 TABLET BY MOUTH AT BEDTIME 06/28/17   [name], MD  simvastatin (ZOCOR) 40 MG tablet Take 1 tablet (40 mg total) by mouth daily at 6 PM. -- Office visit needed for further refills 06/28/17   [name], MD  traMADol (ULTRAM) 50 MG tablet TAKE 1 TABLET(50 MG) BY MOUTH THREE TIMES DAILY AS NEEDED FOR MODERATE PAIN OR SEVERE PAIN 08/11/17   [name], MD    Family History Family History  Problem Relation Age of Onset  . Lung cancer Father        smoker  . Throat cancer Father   . Kidney disease Father        nephrectomy & partial nephrectomy  . Breast cancer Mother   . Dementia Mother   . Heart attack Mother 90  . Prostate cancer Brother   . Heart disease Brother        x 3 (one brother with CAD in the 58s, another age 64s, third one 65s CABG)  . Diabetes Maternal Grandmother   . Heart attack Maternal Grandmother        in 15s  . Stroke Neg Hx     Social History Social History   Tobacco Use  . Smoking status: Never Smoker  . Smokeless tobacco: Never Used  Substance Use Topics  . Alcohol use: No    Alcohol/week: 0.0 oz  . Drug use: No      Allergies   Patient has no known allergies.   Review of Systems Review of Systems  Reason unable to perform ROS: See HPI as above.     Physical Exam Triage Vital Signs ED Triage Vitals [08/12/17 1741]  Enc Vitals Group     BP 134/66     Pulse Rate 62     Resp 16     Temp  98.1 F (36.7 C)     Temp src      SpO2 100 %     Weight      Height      Head Circumference      Peak Flow      Pain Score      Pain Loc      Pain Edu?      Excl. in [place]?    No data found.  Updated Vital Signs BP 134/66   Pulse 62   Temp 98.1 F (36.7 C)   Resp 16   SpO2 100%   Visual Acuity Right Eye Distance:   Left Eye Distance:   Bilateral Distance:    Right Eye Near:   Left Eye Near:    Bilateral Near:     Physical Exam  Constitutional: He is oriented to person, place, and time. He appears well-developed and well-nourished.  Non-toxic appearance. He does not appear ill. No distress.  HENT:  Head: Normocephalic and atraumatic.  Right Ear: External ear and ear canal normal. Tympanic membrane is not erythematous and not bulging.  Left Ear: External ear and ear canal normal. Tympanic membrane is not erythematous and not bulging.  Nose: Right sinus exhibits frontal sinus tenderness. Right sinus exhibits no maxillary sinus tenderness. Left sinus exhibits frontal sinus tenderness. Left sinus exhibits no maxillary sinus tenderness.  Mouth/Throat: Uvula is midline, oropharynx is clear and moist and mucous membranes are normal.  Bilateral TM opaque  Eyes: Pupils are equal, round, and reactive to light. Conjunctivae are normal.  Neck: Normal range of motion. Neck supple.  Cardiovascular: Normal rate, regular rhythm and normal heart sounds. Exam reveals no gallop and no friction rub.  No murmur heard. Pulmonary/Chest: Effort normal and breath sounds normal. He has no decreased breath sounds. He has no wheezes. He has no rhonchi. He has no rales.  Lymphadenopathy:    He has no cervical  adenopathy.  Neurological: He is alert and oriented to person, place, and time. He has normal strength. He is not disoriented. No cranial nerve deficit or sensory deficit. Coordination and gait normal. GCS eye subscore is 4. GCS verbal subscore is 5. GCS motor subscore is 6.  Can ambulate on own without difficulty. Normal finger to nose.   Skin: Skin is warm and dry.  Psychiatric: He has a normal mood and affect. His behavior is normal. Judgment normal.     UC Treatments / Results  Labs (all labs ordered are listed, but only abnormal results are displayed) Labs Reviewed - No data to display  EKG None  Radiology No results found.  Procedures Procedures (including critical care time)  Medications Ordered in UC Medications - No data to display  Initial Impression / Assessment and Plan / UC Course  I have reviewed the triage vital signs and the nursing notes.  Pertinent labs & imaging results that were available during my care of the patient were reviewed by me and considered in my medical decision making (see chart for details).    Discussed possible sinus pressure causing headache. Prednisone, nasonex as directed. Tylenol/motrin for pain. Patient has neurologist for chronic headache, to follow up if symptoms not improving. Return precautions given.  Patient's daughter expresses understanding and agrees to plan.  Final Clinical Impressions(s) / UC Diagnoses   Final diagnoses:  Acute intractable headache, unspecified headache type    ED Prescriptions    Medication Sig Dispense Auth. Provider   mometasone (NASONEX) 50 MCG/ACT nasal spray Place  2 sprays into the nose daily. 17 g Yu, Amy V, PA-C   predniSONE (DELTASONE) 20 MG tablet Take 2 tablets (40 mg total) by mouth daily for 5 days. 10 tablet Tobin Chad, [place] 08/12/17 1850

## 2017-08-12 NOTE — ED Triage Notes (Signed)
Pt c/o R ear pain and headache starting today. Pt has dementia.

## 2017-08-18 IMAGING — CR DG CHEST 2V
2 series · 2 of 2 positions shown · non-contrast
Comparison: 02/11/2011

CLINICAL DATA: Pt c/o left sided sharp chest pains x today. Pain
does not radiate. Pt denies any other chest complaints. Pt states
his wife is an inpatient in the hospital so the pain may be stress
induced. Hx cardiac cath.

EXAM:
CHEST  2 VIEW

[chest pa]
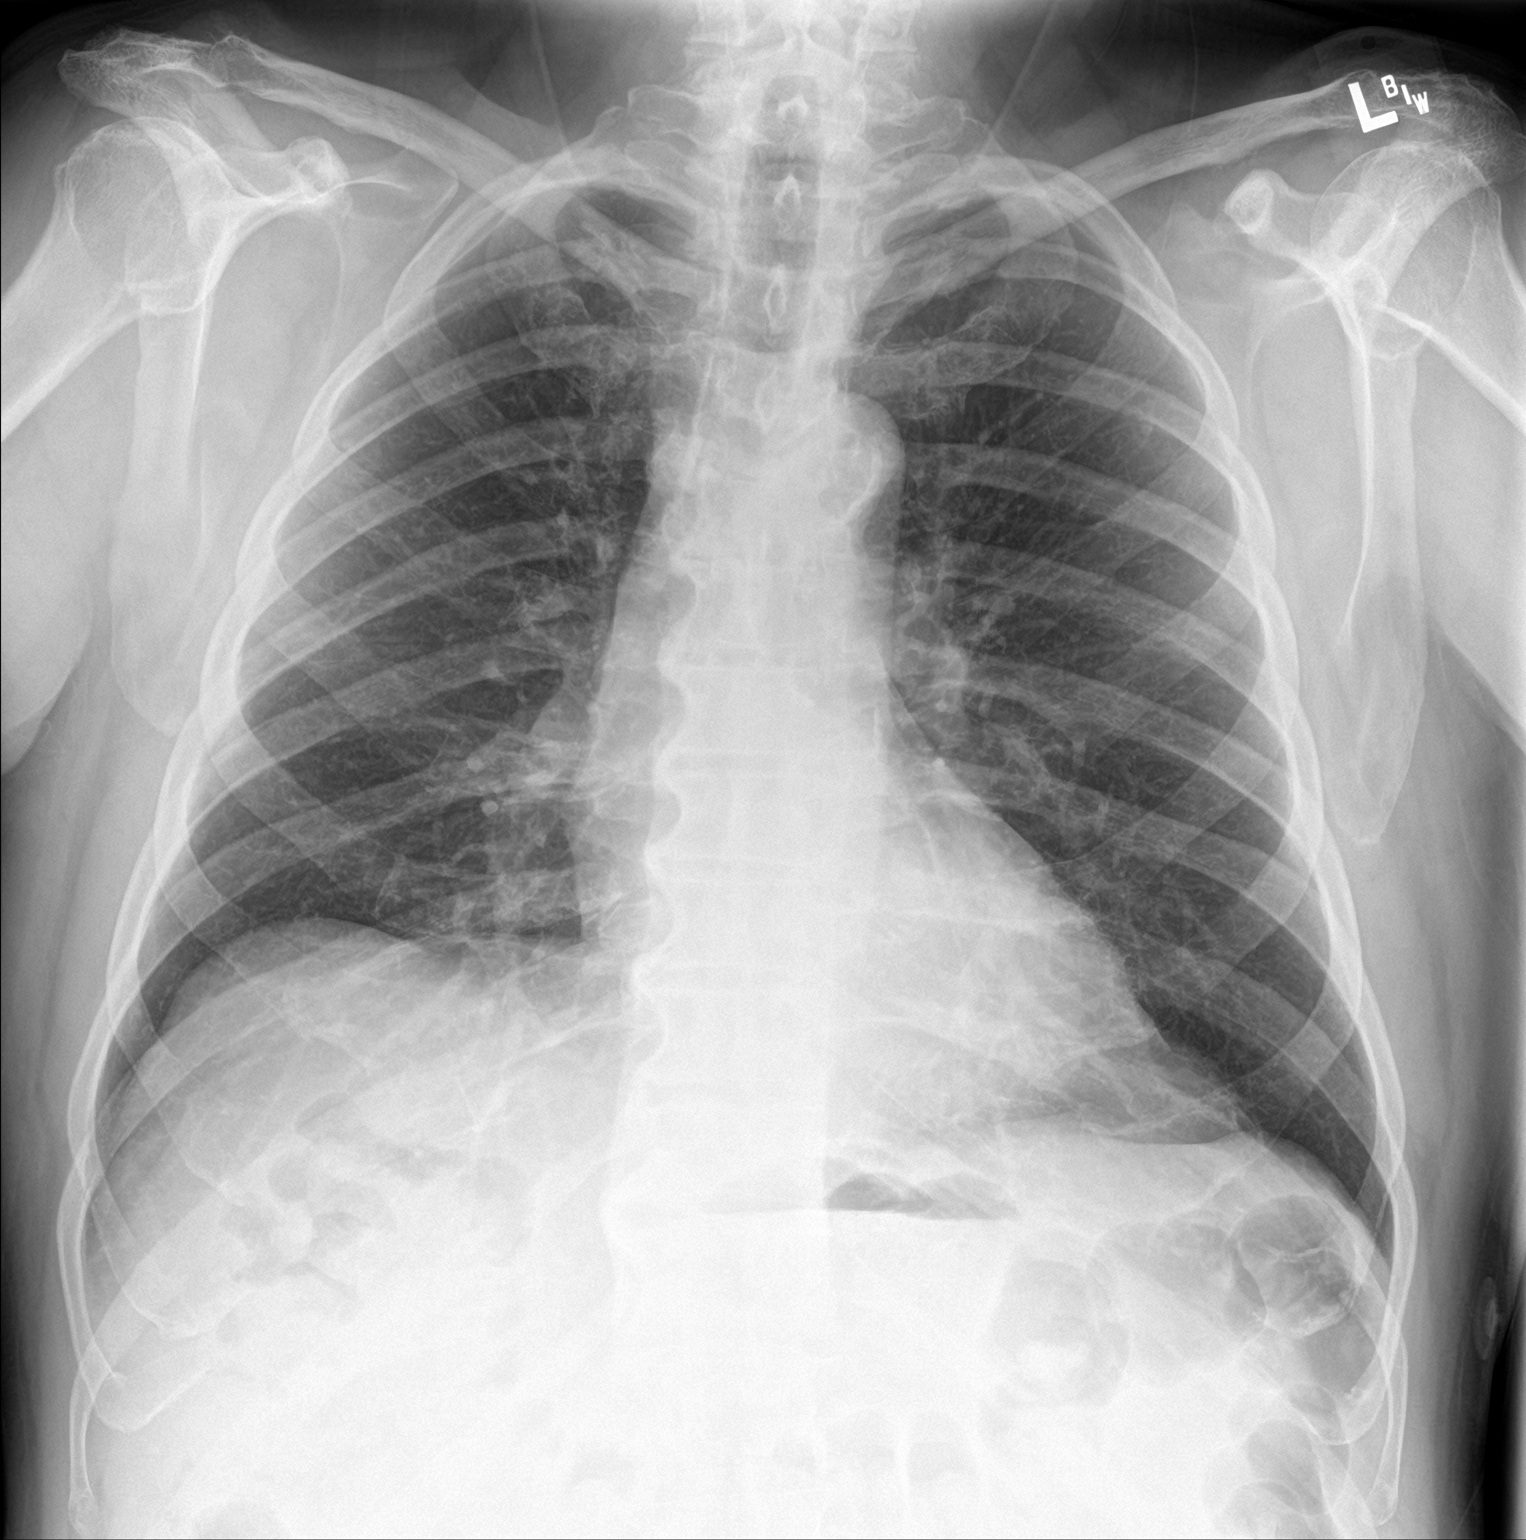

[chest lat]
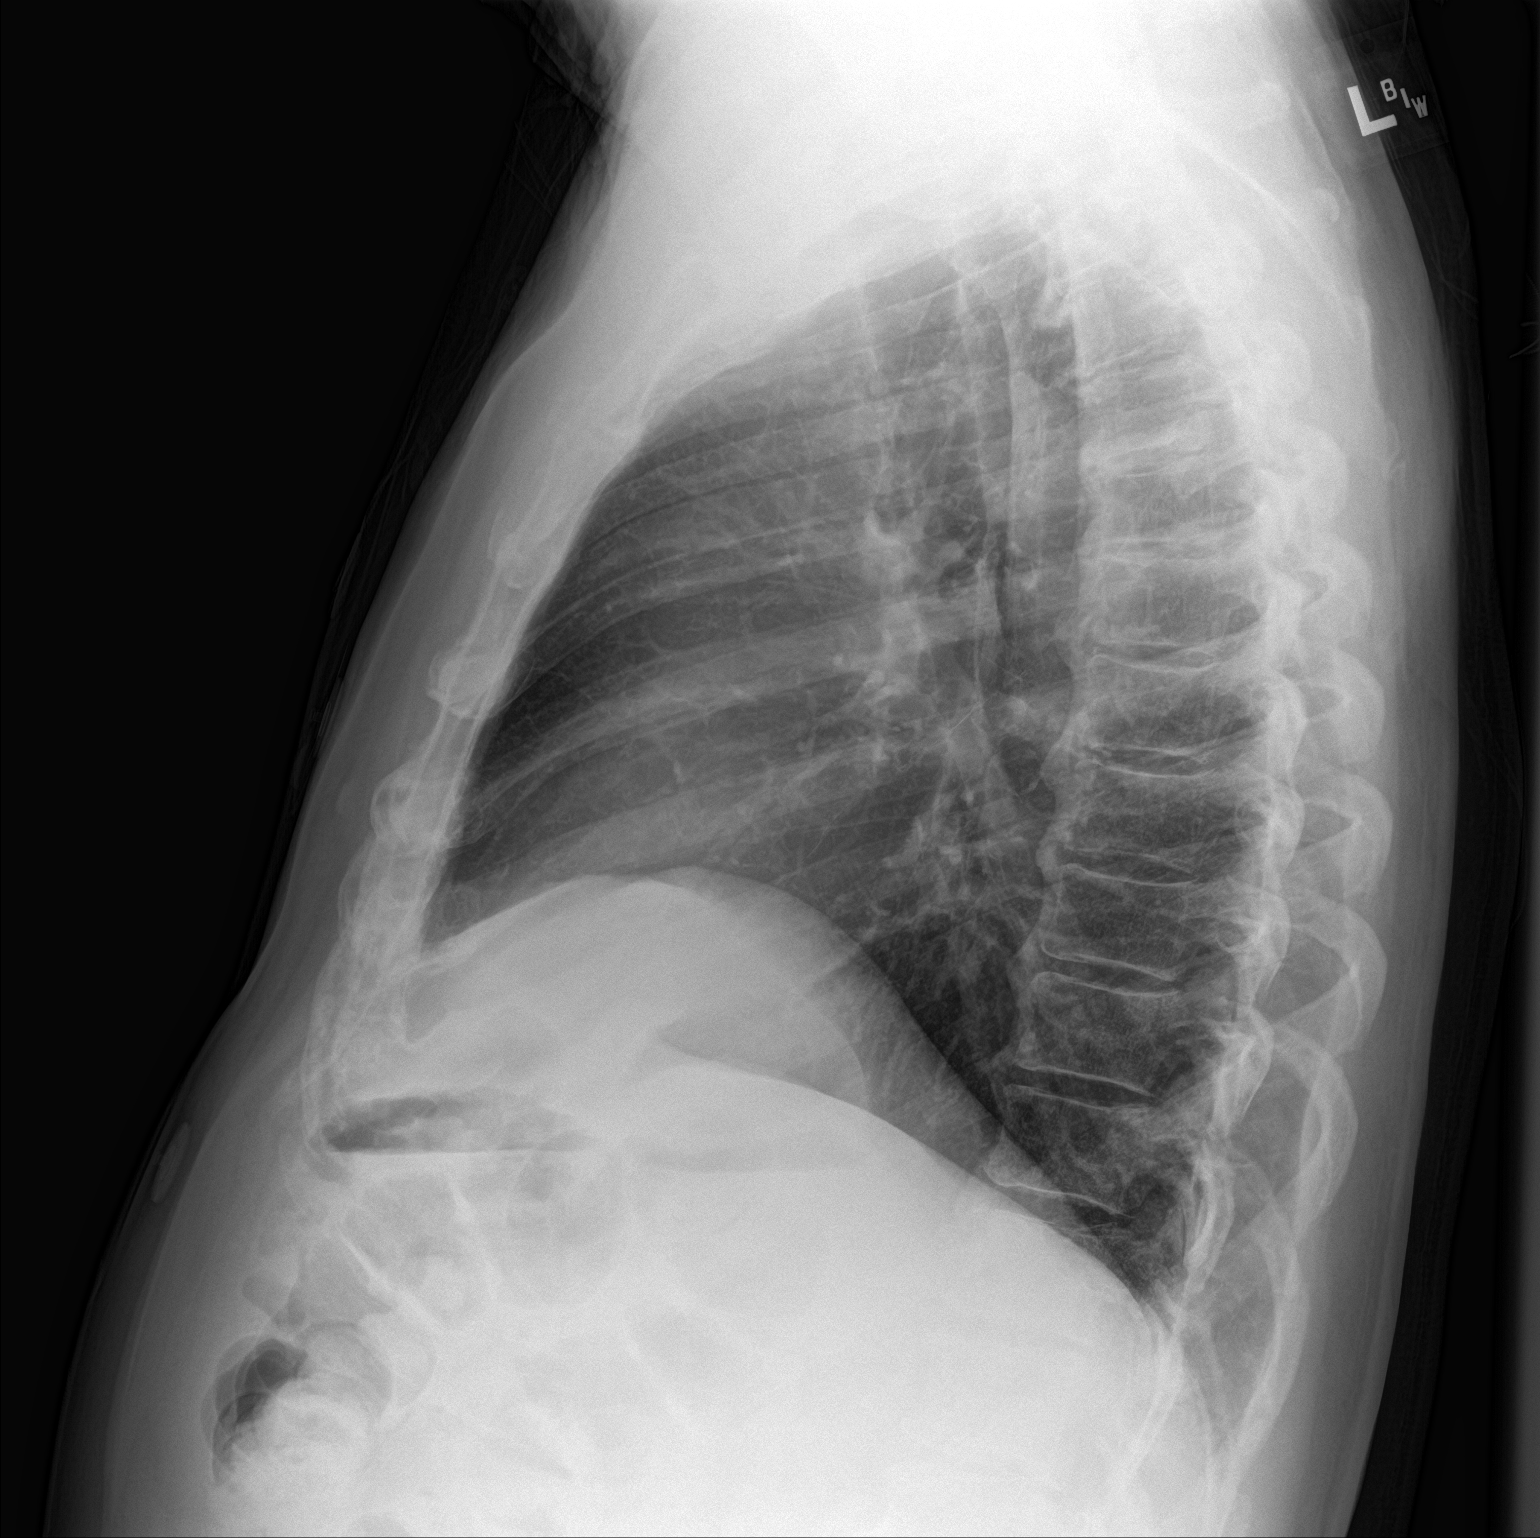

[2 of 2 positions shown; findings below may reference images not displayed]

FINDINGS: Normal mediastinum and cardiac silhouette. Normal pulmonary
vasculature. No evidence of effusion, infiltrate, or pneumothorax.
No acute bony abnormality. Degenerative osteophytosis of the
thoracic spine.
IMPRESSION: No acute cardiopulmonary process.

## 2017-09-05 ENCOUNTER — Telehealth: Payer: Self-pay | Admitting: Internal Medicine

## 2017-09-05 NOTE — Telephone Encounter (Signed)
This appt was requested by his wife.   She had called in c/o dizziness.   I triaged her and scheduled her an appt then she asked me if I could schedule her husband an appt for Friday with Dr. Quay Burow because her daughter is off of work and can bring him in.  She said he wanted to see Dr. Quay Burow regarding a headache and neck and shoulder pain.     I scheduled him with Dr. Quay Burow for Friday at 2:45 09/08/17.    His wife wrote down this appt and will have his daughter bring him in.

## 2017-09-07 NOTE — Progress Notes (Signed)
Subjective:    Patient ID: Matthew [name], male    DOB: 1930-07-24, 84 y.o.   MRN: [id number]  HPI The patient is here for an acute visit.  His wife and daughter are with him.  He has dementia and is not a great historian.  His wife and daughter supplement his history.   Headache:  He has chronic headaches.  They started years ago.  No one is exactly sure how long they have been going on, but they are getting more severe.  He states they are in the posterior head and come up to the top of the head.  He is not able to describe the type of pain it is.  He denies frontal or temple head pain.  He takes two tramadol for the pain, but it only helps minimally.    Neck and shoulder pain:  He has chronic pain in both shoulders and his neck  He denies any pain radiating into his arm or numbness/tingling in his arms.  He denies weakness in her arms or hands.    He has had a cervical fusion.  S/p rigth shoulder surgery.      Medications and allergies reviewed with patient and updated if appropriate.  Patient Active Problem List   Diagnosis Date Noted  . Chronic neck pain 09/08/2017  . GERD (gastroesophageal reflux disease) 04/01/2017  . Rectal bleeding 04/01/2017  . Chronic headaches 04/01/2017  . Mild aortic stenosis 05/12/2015  . Constipation 05/12/2015  . Essential hypertension   . Chest pain 04/30/2015  . CKD (chronic kidney disease) stage 3, GFR 30-59 ml/min (HCC) 04/30/2015  . Pancreatic cyst 02/12/2015  . Hereditary and idiopathic peripheral neuropathy 11/07/2014  . Moderate dementia without behavioral disturbance 11/02/2009  . SKIN CANCER, HX OF 11/02/2009  . ANXIETY 02/03/2007  . HEMORRHOIDS, INTERNAL 02/03/2007  . ARTHRITIS 02/03/2007  . SLEEP APNEA 02/03/2007  . Hyperlipidemia 11/13/2006  . PVD 11/13/2006  . PROSTATE CANCER, HX OF 11/13/2006  . COLONIC POLYPS, HYPERPLASTIC 12/24/2003  . HIATAL HERNIA 12/24/2003  . Diverticulosis of large intestine 12/24/2003    Current  Outpatient Medications on File Prior to Visit  Medication Sig Dispense Refill  . aspirin EC 81 MG tablet Take 81 mg by mouth daily.    . bisacodyl (DULCOLAX) 5 MG EC tablet Take 5 mg by mouth daily as needed for moderate constipation.    . dimenhyDRINATE (DRAMAMINE) 50 MG tablet Take 50 mg by mouth every 8 (eight) hours as needed for nausea.    [name] donepezil (ARICEPT) 10 MG tablet Take 1 tablet (10 mg total) by mouth at bedtime. 90 tablet 3  . lidocaine (XYLOCAINE) 5 % ointment Apply 1 application topically as needed for moderate pain. Prescription faxed to [place], 12 refills    . memantine (NAMENDA) 10 MG tablet Take 1 tablet (10 mg total) by mouth 2 (two) times daily. 180 tablet 4  . mometasone (NASONEX) 50 MCG/ACT nasal spray Place 2 sprays into the nose daily. 17 g 12  . omeprazole (PRILOSEC) 40 MG capsule Take 1 capsule (40 mg total) by mouth daily. 90 capsule 2  . polyethylene glycol (MIRALAX / GLYCOLAX) packet Take 17 g by mouth daily as needed (constipation).     . pregabalin (LYRICA) 75 MG capsule Take 1 capsule (75 mg total) by mouth 2 (two) times daily. 60 capsule 5  . ranitidine (ZANTAC) 150 MG tablet TAKE 1 TABLET BY MOUTH AT BEDTIME 90 tablet 0  .  simvastatin (ZOCOR) 40 MG tablet Take 1 tablet (40 mg total) by mouth daily at 6 PM. -- Office visit needed for further refills 90 tablet 0  . traMADol (ULTRAM) 50 MG tablet TAKE 1 TABLET(50 MG) BY MOUTH THREE TIMES DAILY AS NEEDED FOR MODERATE PAIN OR SEVERE PAIN 90 tablet 0   No current facility-administered medications on file prior to visit.     Past Medical History:  Diagnosis Date  . Alzheimer's dementia    "don't know what stage but it's not early" (04/30/2015)  . Anxiety   . Arthritis   . CAD (coronary artery disease)    a.  mild non obstructive CAD on cath 02/2011  . Cataract    NOS  . Chronic kidney disease   . Diverticulosis of colon   . Dysphagia    PMH of  . Hemorrhoids, internal   . Hiatal hernia    . History of colon polyps 2005   hyperplastic; Dr Matthew [name]  . Hyperlipidemia   . Hypertension   . Impotence of organic origin   . Metabolic syndrome X   . Prostate cancer ([place])    Dr Matthew [name]  . PVD (peripheral vascular disease) ([place])   . Schatzki's ring   . Skin cancer     Past Surgical History:  Procedure Laterality Date  . APPENDECTOMY  1950  . BACK SURGERY    . CERVICAL FUSION    . COLONOSCOPY W/ POLYPECTOMY  2005   Dr Matthew [name]  . EUS N/A 01/15/2015   Procedure: UPPER ENDOSCOPIC ULTRASOUND (EUS) RADIAL;  Surgeon: Matthew Banister, MD;  Location: [facility];  Service: Endoscopy;  Laterality: N/A;  . LEFT HEART CATHETERIZATION WITH CORONARY ANGIOGRAM N/A 03/02/2012   Procedure: LEFT HEART CATHETERIZATION WITH CORONARY ANGIOGRAM;  Surgeon: Matthew Dresser, MD;  Location[facility] CATH LAB;  Service: Cardiovascular;  Laterality: N/A;  . [place]; no chemo or radiation  . SHOULDER SURGERY     right  . TONSILLECTOMY      Social History   Socioeconomic History  . Marital status: Married    Spouse name: Matthew [name]  . Number of children: 4  . Years of education: 31  . Highest education level: Not on file  Occupational History  . Occupation: Retired    Comment: TEFL teacher  Social Needs  . Financial resource strain: Not hard at all  . Food insecurity:    Worry: Never true    Inability: Never true  . Transportation needs:    Medical: No    Non-medical: No  Tobacco Use  . Smoking status: Never Smoker  . Smokeless tobacco: Never Used  Substance and Sexual Activity  . Alcohol use: No    Alcohol/week: 0.0 standard drinks  . Drug use: No  . Sexual activity: Not on file  Lifestyle  . Physical activity:    Days per week: 0 days    Minutes per session: 0 min  . Stress: Not at all  Relationships  . Social connections:    Talks on phone: More than three times a week    Gets together: More than three times a week    Attends religious  service: Not on file    Active member of club or organization: Not on file    Attends meetings of clubs or organizations: Not on file    Relationship status: Married  Other Topics Concern  . Not on file  Social History Narrative   Lives at home  with wife.      Family History  Problem Relation Age of Onset  . Lung cancer Father        smoker  . Throat cancer Father   . Kidney disease Father        nephrectomy & partial nephrectomy  . Breast cancer Mother   . Dementia Mother   . Heart attack Mother 51  . Prostate cancer Brother   . Heart disease Brother        x 3 (one brother with CAD in the 48s, another age 28s, third one 81s CABG)  . Diabetes Maternal Grandmother   . Heart attack Maternal Grandmother        in 31s  . Stroke Neg Hx     Review of Systems  Constitutional: Negative for fever.  Eyes: Negative for visual disturbance.  Gastrointestinal: Negative for nausea.       No fecal incontinence  Genitourinary:       No urinary incontinence  Musculoskeletal: Positive for neck pain. Negative for gait problem.  Neurological: Positive for headaches. Negative for dizziness, weakness (in arms or legs), light-headedness and numbness.       Objective:   Vitals:   09/08/17 1456  BP: 126/74  Pulse: (!) 52  Resp: 16  Temp: 97.7 F (36.5 C)  SpO2: 97%   BP Readings from Last 3 Encounters:  09/08/17 126/74  08/12/17 134/66  03/31/17 116/72   Wt Readings from Last 3 Encounters:  09/08/17 196 lb 12.8 oz (89.3 kg)  03/31/17 197 lb (89.4 kg)  02/22/17 200 lb 9.6 oz (91 kg)   Body mass index is 29.06 kg/m.   Physical Exam  Constitutional: He appears well-developed and well-nourished.  Non-toxic appearance. He does not appear ill. No distress.  HENT:  Head: Normocephalic and atraumatic.  Musculoskeletal: Normal range of motion. He exhibits tenderness (posterior c-spine and paravertebral muscles in the neck and upper back).  Neurological: He is alert. He has normal  strength. No cranial nerve deficit or sensory deficit. Coordination and gait normal.  Skin: Skin is warm and dry.        Assessment & Plan:    See Problem List for Assessment and Plan of chronic medical problems.

## 2017-09-08 ENCOUNTER — Ambulatory Visit (INDEPENDENT_AMBULATORY_CARE_PROVIDER_SITE_OTHER)
Admission: RE | Admit: 2017-09-08 | Discharge: 2017-09-08 | Disposition: A | Discharge status: Home / Self Care | Payer: Medicare Other | Source: Ambulatory Visit | Attending: Internal Medicine | Admitting: Internal Medicine

## 2017-09-08 ENCOUNTER — Ambulatory Visit: Payer: Medicare Other | Admitting: Internal Medicine

## 2017-09-08 ENCOUNTER — Encounter: Payer: Self-pay | Admitting: Internal Medicine

## 2017-09-08 VITALS — BP 126/74 | HR 52 | Temp 97.7°F | Resp 16 | Ht 69.0 in | Wt 196.8 lb

## 2017-09-08 DIAGNOSIS — R51 Headache: Secondary | ICD-10-CM | POA: Diagnosis not present

## 2017-09-08 DIAGNOSIS — M542 Cervicalgia: Secondary | ICD-10-CM

## 2017-09-08 DIAGNOSIS — G8929 Other chronic pain: Secondary | ICD-10-CM

## 2017-09-08 DIAGNOSIS — G4489 Other headache syndrome: Secondary | ICD-10-CM | POA: Diagnosis not present

## 2017-09-08 NOTE — Patient Instructions (Addendum)
Have an x-ray of your neck today.    An MRI of the neck and brain was ordered.    An orthopedic referral was also ordered.

## 2017-09-09 MED ORDER — DIAZEPAM 5 MG PO TABS: ORAL_TABLET | ORAL | 0 refills | Status: DC

## 2017-09-09 NOTE — Assessment & Plan Note (Signed)
Ongoing for years - has had c-spine fusion in past Possibly arthritic in nature Will get xray MRI of c-spine - may be causing this chronic headaches Refer to ortho Continue tramadol for pain - can take tylenol with tramadol

## 2017-09-09 NOTE — Assessment & Plan Note (Signed)
Ongoing for years Posterior headaches - possible related to c-spine disease  MRI of s-spine and brain ordered Continue tramadol as needed- take tylenol with tramadol Referred to ortho Has discussed with his neurologist in the past - advised to discuss it with him again at his next visit

## 2017-09-11 ENCOUNTER — Encounter: Payer: Self-pay | Admitting: Internal Medicine

## 2017-09-14 ENCOUNTER — Other Ambulatory Visit: Payer: Self-pay | Admitting: Internal Medicine

## 2017-09-15 NOTE — Telephone Encounter (Signed)
[place] Controlled Substance Database checked. Last filled on 08/11/17  Last OV 03/31/17

## 2017-09-19 ENCOUNTER — Other Ambulatory Visit: Payer: Self-pay | Admitting: Internal Medicine

## 2017-09-20 IMAGING — CT CT ABDOMEN WO/W CM
3 of 10 series · 13 of 46 positions shown, 17 images · IV contrast (ISOVUE 300)
Comparison: 12/19/2014

CLINICAL DATA: Follow-up pancreatic cystic

EXAM:
CT ABDOMEN WITHOUT AND WITH CONTRAST
TECHNIQUE: Multidetector CT imaging of the abdomen was performed following the
standard protocol before and following the bolus administration of
intravenous contrast.
CONTRAST:  80mL [id number] IOPAMIDOL ([id number]) INJECTION 61%

[Series 5: venous phase 5.0 b30f · axial · portal-venous · 0.79mm/px · z∈[-221,-126]mm · 2 of 58 slices shown, 5 images]
[im 20/58  soft-tissue]
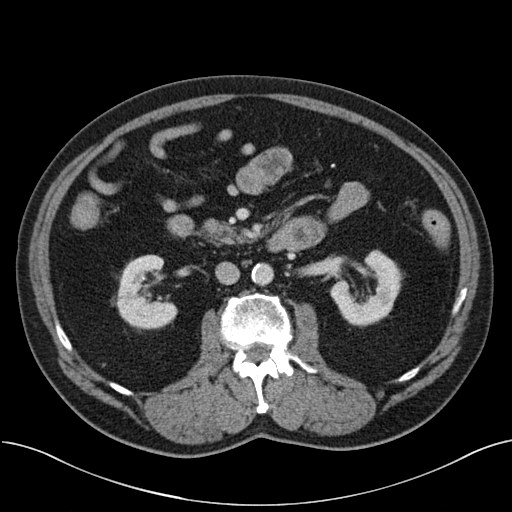
[im 20/58  lung]
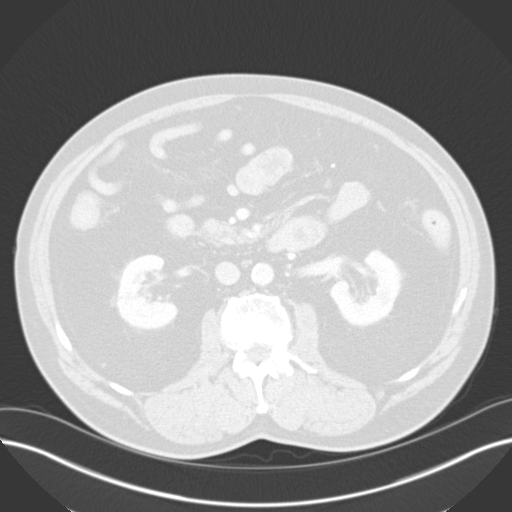
[im 20/58  bone]
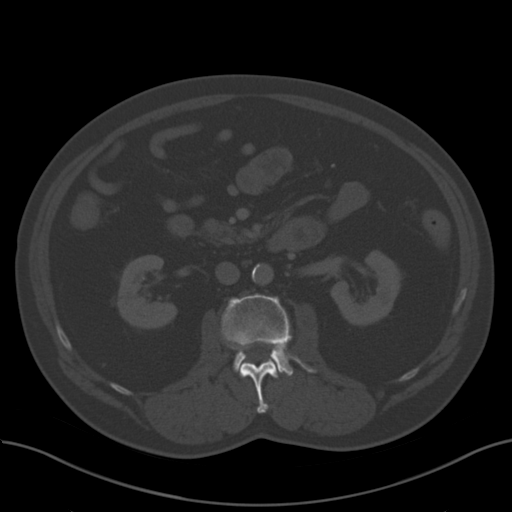
[im 39/58  soft-tissue]
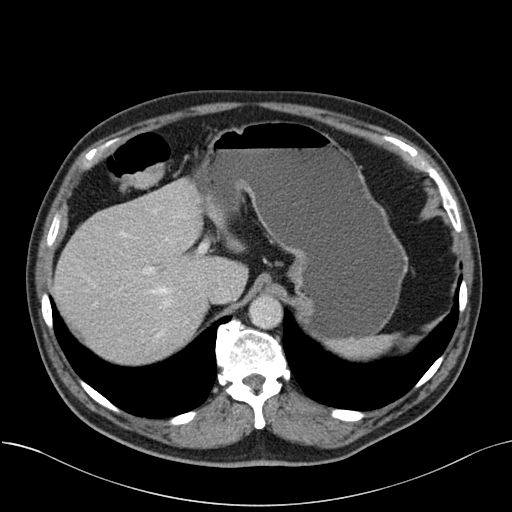
[im 39/58  lung]
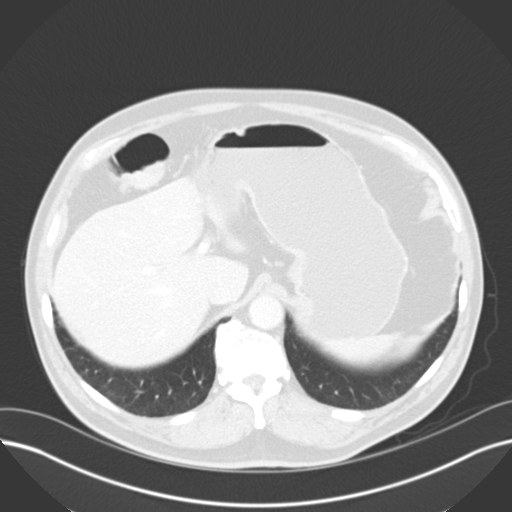

[Series 7: thins · axial · 0.79mm/px · z∈[-291,-61]mm · 8 of 145 slices shown]
[im 15/145  soft-tissue]
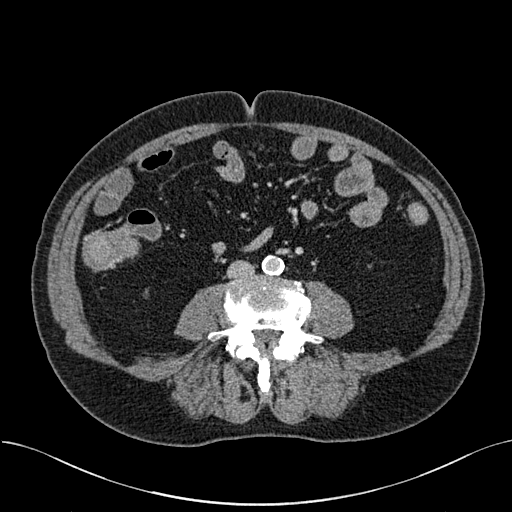
[im 29/145  soft-tissue]
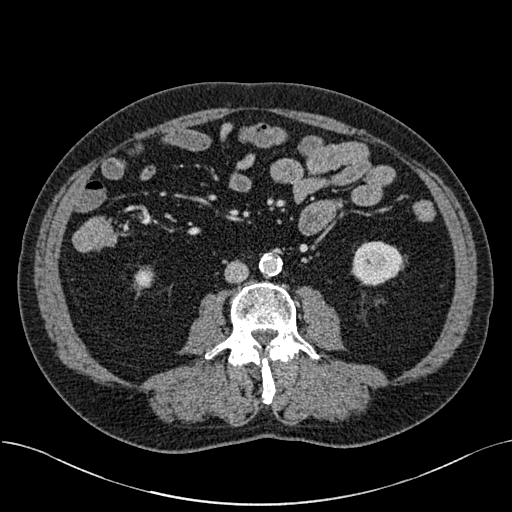
[im 44/145  soft-tissue]
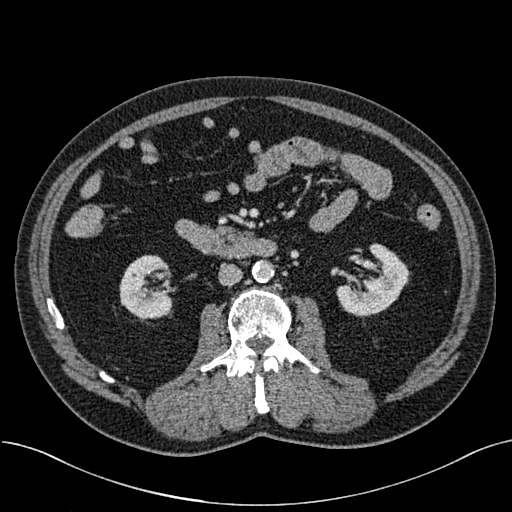
[im 58/145  soft-tissue]
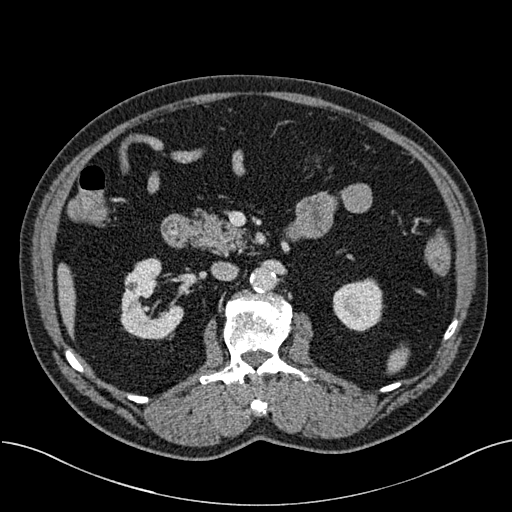
[im 87/145  soft-tissue]
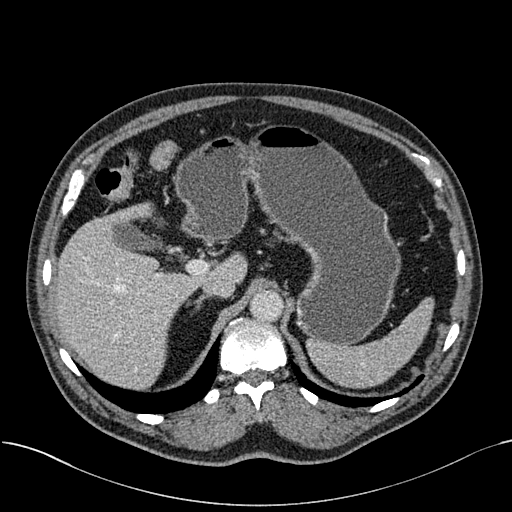
[im 101/145  soft-tissue]
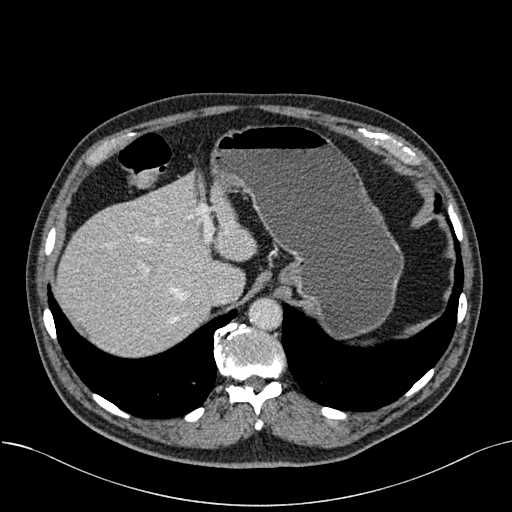
[im 116/145  soft-tissue]
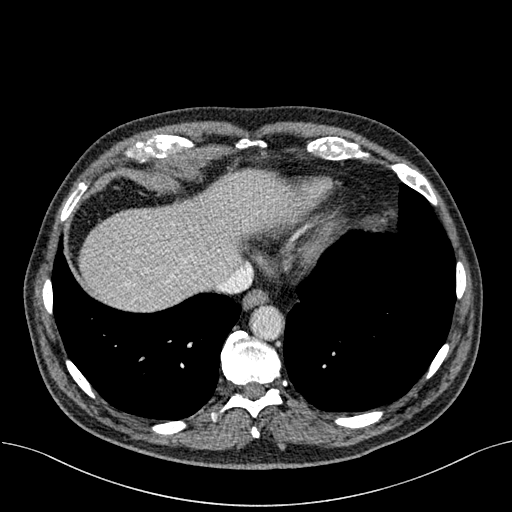
[im 130/145  soft-tissue]
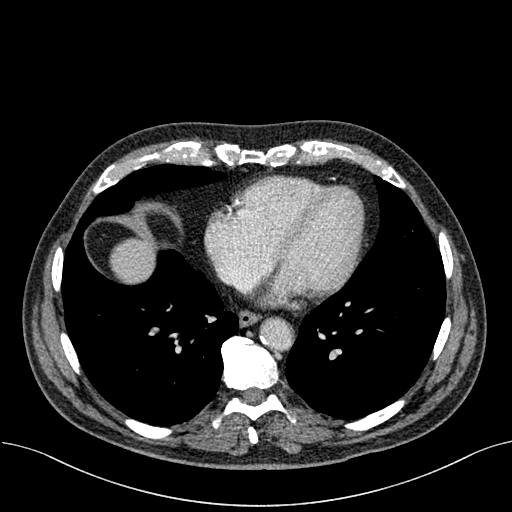

[Series 9: coronal arterial · coronal · arterial · 0.55mm/px · 3 of 105 slices shown, 4 images]
[im 27/105  soft-tissue]
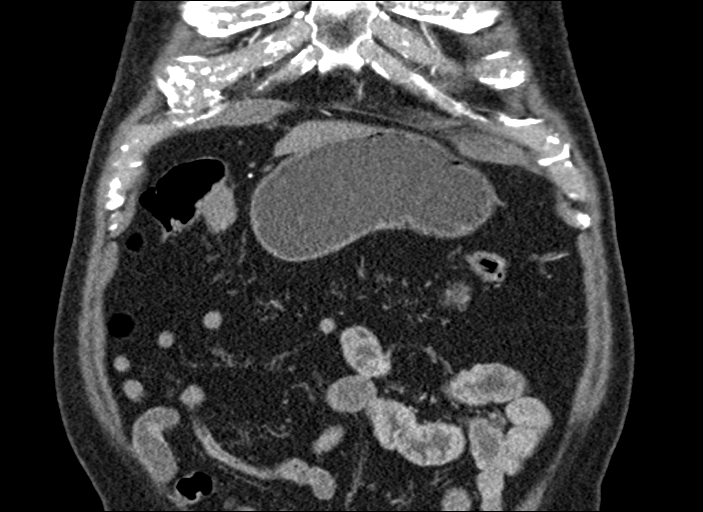
[im 53/105  soft-tissue]
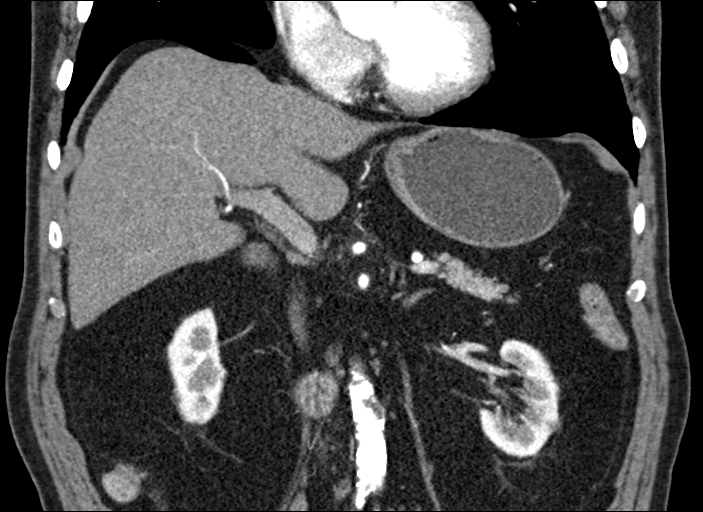
[im 53/105  bone]
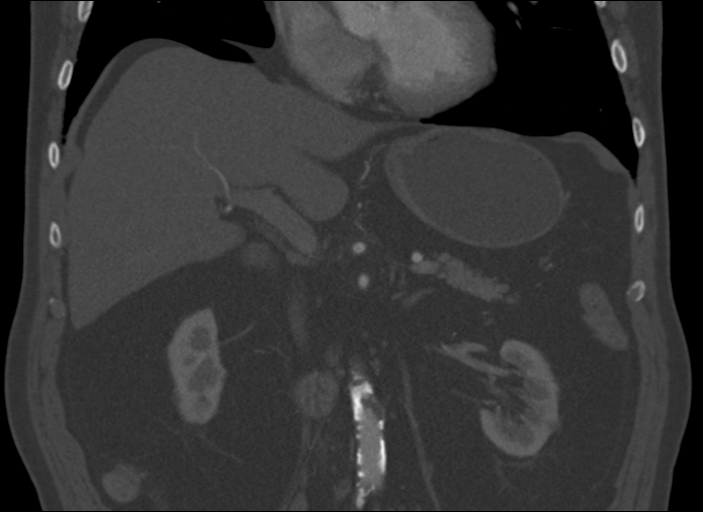
[im 79/105  soft-tissue]
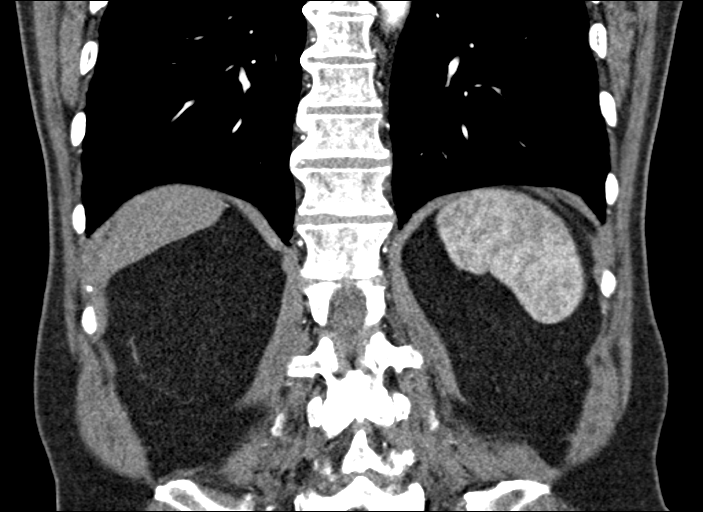

[13 of 46 positions shown; findings below may reference images not displayed]

FINDINGS: Lower chest:  Lung bases are clear.

Hepatobiliary: Liver is within normal limits. No
suspicious/enhancing hepatic lesions.

Gallbladder is within normal limits. No intrahepatic or extrahepatic
ductal dilatation.

Pancreas: Pancreas is notable for a 2.3 x 2.7 x 2.0 cm cystic lesion
along the pancreatic uncinate process (series 3/ image 57),
unchanged. Faint internal septations are suspected. Differential
considerations include a pseudocyst or IPMN. No dilatation of the
main pancreatic duct. No pancreatic atrophy.

Spleen: Within normal limits.

Adrenals/Urinary Tract: Adrenal glands are within normal limits.

Multiple nonobstructing bilateral renal calculi measuring 2-3 mm.
Tiny bilateral renal cysts measuring up to 8 mm on the right (series
5/ image 36). No hydronephrosis.

Stomach/Bowel: Stomach is within normal limits.

Visualized bowel is unremarkable.

Vascular/Lymphatic: No evidence of abdominal aortic aneurysm.
Atherosclerotic calcifications of the abdominal aorta and branch
vessels.

No suspicious abdominal lymphadenopathy

Other: No abdominal ascites.

Musculoskeletal: Degenerative changes of the visualized
thoracolumbar spine.
IMPRESSION: 2.3 x 2.7 x 2.0 cm cystic lesion along the pancreatic uncinate
process, unchanged. Differential considerations include a pseudocyst
or IPMN.

Six month stability has been demonstrated. Additional follow-up CT
or MRI is suggested at six-month intervals for at least a total of 2
years from initial CT.

This recommendation follows ACR consensus guidelines: Managing
Incidental Findings on Abdominal CT: White Paper of the ACR
Incidental Findings Committee. [HOSPITAL] 4696;[DATE]

## 2017-09-22 ENCOUNTER — Telehealth: Payer: Self-pay

## 2017-09-22 NOTE — Telephone Encounter (Signed)
Copied from [place] [id number]. Topic: Quick Communication - Other Results >> Sep 22, 2017  9:21 AM Scherrie Gerlach wrote: Pt had an xray on 8/30 of the neck and has not heard anything.  A mychart message was sent by the dr, but wife states the do not get on mychart that often.  Prefers to get a call back.

## 2017-09-22 NOTE — Telephone Encounter (Signed)
LVM for pt to call back for results.

## 2017-09-22 NOTE — Telephone Encounter (Signed)
Pt aware of results 

## 2017-09-27 ENCOUNTER — Other Ambulatory Visit: Payer: Self-pay | Admitting: Diagnostic Neuroimaging

## 2017-10-05 ENCOUNTER — Ambulatory Visit
Admission: RE | Admit: 2017-10-05 | Discharge: 2017-10-05 | Disposition: A | Discharge status: Home / Self Care | Payer: Medicare Other | Source: Ambulatory Visit | Attending: Internal Medicine | Admitting: Internal Medicine

## 2017-10-05 ENCOUNTER — Telehealth: Payer: Self-pay | Admitting: Internal Medicine

## 2017-10-05 DIAGNOSIS — R51 Headache: Secondary | ICD-10-CM | POA: Diagnosis not present

## 2017-10-05 DIAGNOSIS — M4322 Fusion of spine, cervical region: Secondary | ICD-10-CM | POA: Diagnosis not present

## 2017-10-05 DIAGNOSIS — M542 Cervicalgia: Secondary | ICD-10-CM

## 2017-10-05 DIAGNOSIS — G4489 Other headache syndrome: Secondary | ICD-10-CM

## 2017-10-05 DIAGNOSIS — G8929 Other chronic pain: Secondary | ICD-10-CM

## 2017-10-05 DIAGNOSIS — M4712 Other spondylosis with myelopathy, cervical region: Secondary | ICD-10-CM

## 2017-10-05 NOTE — Telephone Encounter (Signed)
Matthew [name], from [facility] calling to report MRI results from 10/05/17. Results available in Epic. IMPRESSION: 1. C3-4 degenerative cord compression with mild T2 hyperintensity. There is impingement of both foramina at this level. 2. C4-5 and C5-6 ACDF with solid arthrodesis. 3. C7-T1 left facet ankylosis.

## 2017-10-05 NOTE — Telephone Encounter (Signed)
LVM for pt to call back for results.

## 2017-10-05 NOTE — Telephone Encounter (Signed)
MRI of his spine shows some arthritic changes at C3-4 that is causing some impingement -this needs to be evaluated further --  he was referred to ortho - does he have an appointment?

## 2017-10-06 NOTE — Addendum Note (Signed)
Addended by: [name] on: 10/06/2017 05:12 PM   Modules accepted: Orders

## 2017-10-06 NOTE — Telephone Encounter (Signed)
LVM 2nd time for pt to call back for results.

## 2017-10-06 NOTE — Telephone Encounter (Signed)
The MRI of his brain showed age-related changes, no acute changes and no explanation for his headaches.  Referral for orthopedics placed.

## 2017-10-06 NOTE — Telephone Encounter (Signed)
Spoke with pts wife and notified her of results. He had an appointment on 9/18 per [facility] and he canceled the appointment. She wants another referral put in for Dr. Carl Best. That referral was closed. Also wife wanted to know about the head MRI. She is aware you are out of the office incase she does not get results until Monday.

## 2017-10-09 NOTE — Telephone Encounter (Signed)
Pts wife aware of results below.

## 2017-10-09 NOTE — Telephone Encounter (Signed)
Pt wife called back returning the call to get pt results

## 2017-10-09 NOTE — Telephone Encounter (Signed)
LVM for pt to call back in regards to note below.  

## 2017-10-11 ENCOUNTER — Telehealth: Payer: Self-pay | Admitting: Diagnostic Neuroimaging

## 2017-10-11 NOTE — Telephone Encounter (Signed)
Called wife, on DPR who asked if patient can drive. This RN advised her that Dr Leta Baptist stated in Feb he should not be driving. She stated she renewed his DL so he'd "have some identification", and  "now he wants to drive". This RN repeated that Dr Leta Baptist stated he should not drive, and they would have received a AVS stating the same. Advised her that this RN is certain Dr Leta Baptist would not change his mind on that point.  She stated she doesn't remember, but her children have said he shouldn't drive per the dr. The patient then spoke on phone and asked this RN what she was doing. This RN advised that what she is advising his wife is to keep him safe. She asked if she could get a copy of Dr Gladstone Lighter note. This RN asked about his [facility] POA; wife stated he doesn't have one, but she "does everything for him".  This RN advised a release of information must be signed first.  She then stated he saw his PCP for the headaches he "always complains about". The PCP ordered two scans, and wife states the PCP said the MRI of brain doesn't show dementia. This RN advised that an MRI is not the only tool physicians use to diagnose dementia.  She said he is being referred to a dr because he needs surgery on his neck. This RN advised that his headaches may be due to the issue in his neck.  The wife then stated she didn't want him to drive because of all the traffic. This RN advised her there are many reasons he shouldn't drive including possibly getting lost and forgetting driving laws, signs and signals, as well as heavy traffic. The wife agreed, verbalized understanding, thanked this RN for return call.

## 2017-10-11 NOTE — Telephone Encounter (Signed)
Pts wife [name] requesting a call to discuss if the pt is allowed to drive, also stating she has a few questions she'd like to discus with the RN

## 2017-10-12 ENCOUNTER — Ambulatory Visit: Payer: Medicare Other

## 2017-10-27 ENCOUNTER — Other Ambulatory Visit: Payer: Self-pay | Admitting: Internal Medicine

## 2017-10-27 NOTE — Telephone Encounter (Signed)
Last refill was 09/28/17 Last OV was 09/08/2017 Next OV is not listed

## 2017-11-08 NOTE — Progress Notes (Signed)
Subjective:    Patient ID: Matthew [name], male    DOB: June 10, 1930, 84 y.o.   MRN: [id number]  HPI    Medications and allergies reviewed with patient and updated if appropriate.  Patient Active Problem List   Diagnosis Date Noted  . Chronic neck pain 09/08/2017  . GERD (gastroesophageal reflux disease) 04/01/2017  . Rectal bleeding 04/01/2017  . Chronic headaches 04/01/2017  . Mild aortic stenosis 05/12/2015  . Constipation 05/12/2015  . Essential hypertension   . Chest pain 04/30/2015  . CKD (chronic kidney disease) stage 3, GFR 30-59 ml/min (HCC) 04/30/2015  . Pancreatic cyst 02/12/2015  . Hereditary and idiopathic peripheral neuropathy 11/07/2014  . Moderate dementia without behavioral disturbance ([place]) 11/02/2009  . SKIN CANCER, HX OF 11/02/2009  . ANXIETY 02/03/2007  . HEMORRHOIDS, INTERNAL 02/03/2007  . ARTHRITIS 02/03/2007  . SLEEP APNEA 02/03/2007  . Hyperlipidemia 11/13/2006  . PVD 11/13/2006  . PROSTATE CANCER, HX OF 11/13/2006  . COLONIC POLYPS, HYPERPLASTIC 12/24/2003  . HIATAL HERNIA 12/24/2003  . Diverticulosis of large intestine 12/24/2003    Current Outpatient Medications on File Prior to Visit  Medication Sig Dispense Refill  . aspirin EC 81 MG tablet Take 81 mg by mouth daily.    . bisacodyl (DULCOLAX) 5 MG EC tablet Take 5 mg by mouth daily as needed for moderate constipation.    . diazepam (VALIUM) 5 MG tablet Take one pill one hour prior to MRI, may repeat x 1 if needed 2 tablet 0  . dimenhyDRINATE (DRAMAMINE) 50 MG tablet Take 50 mg by mouth every 8 (eight) hours as needed for nausea.    [name] donepezil (ARICEPT) 10 MG tablet Take 1 tablet (10 mg total) by mouth at bedtime. 90 tablet 3  . lidocaine (XYLOCAINE) 5 % ointment Apply 1 application topically as needed for moderate pain. Prescription faxed to [place], 12 refills    . memantine (NAMENDA) 10 MG tablet Take 1 tablet (10 mg total) by mouth 2 (two) times daily. 180 tablet 4  .  mometasone (NASONEX) 50 MCG/ACT nasal spray Place 2 sprays into the nose daily. 17 g 12  . omeprazole (PRILOSEC) 40 MG capsule TAKE 1 CAPSULE BY MOUTH EVERY DAY 90 capsule 2  . polyethylene glycol (MIRALAX / GLYCOLAX) packet Take 17 g by mouth daily as needed (constipation).     . pregabalin (LYRICA) 75 MG capsule Take 1 capsule (75 mg total) by mouth 2 (two) times daily. 60 capsule 5  . ranitidine (ZANTAC) 150 MG tablet TAKE 1 TABLET BY MOUTH AT BEDTIME 90 tablet 0  . simvastatin (ZOCOR) 40 MG tablet TAKE 1 TABLET BY MOUTH DAILY AT 6 PM- OFFICE VISIT NEEDED FOR FURTHER REFILLS 90 tablet 0  . traMADol (ULTRAM) 50 MG tablet TAKE 1 TABLET(50 MG) BY MOUTH THREE TIMES DAILY AS NEEDED FOR MODERATE PAIN OR SEVERE PAIN 90 tablet 0   No current facility-administered medications on file prior to visit.     Past Medical History:  Diagnosis Date  . Alzheimer's dementia    "don't know what stage but it's not early" (04/30/2015)  . Anxiety   . Arthritis   . CAD (coronary artery disease)    a.  mild non obstructive CAD on cath 02/2011  . Cataract    NOS  . Chronic kidney disease   . Diverticulosis of colon   . Dysphagia    PMH of  . Hemorrhoids, internal   . Hiatal hernia   .  History of colon polyps 2005   hyperplastic; Dr [name]  . Hyperlipidemia   . Hypertension   . Impotence of organic origin   . Metabolic syndrome X   . Prostate cancer ([place])    Dr [name]  . PVD (peripheral vascular disease) ([place])   . Schatzki's ring   . Skin cancer     Past Surgical History:  Procedure Laterality Date  . APPENDECTOMY  1950  . BACK SURGERY    . CERVICAL FUSION    . COLONOSCOPY W/ POLYPECTOMY  2005   Dr [name]  . EUS N/A 01/15/2015   Procedure: UPPER ENDOSCOPIC ULTRASOUND (EUS) RADIAL;  Surgeon: [name], MD;  Location: [facility];  Service: Endoscopy;  Laterality: N/A;  . LEFT HEART CATHETERIZATION WITH CORONARY ANGIOGRAM N/A 03/02/2012   Procedure: LEFT HEART CATHETERIZATION WITH  CORONARY ANGIOGRAM;  Surgeon: [name], MD;  Location: [facility] CATH LAB;  Service: Cardiovascular;  Laterality: N/A;  . [place]; no chemo or radiation  . SHOULDER SURGERY     right  . TONSILLECTOMY      Social History   Socioeconomic History  . Marital status: Married    Spouse name: [name]  . Number of children: 4  . Years of education: 18  . Highest education level: Not on file  Occupational History  . Occupation: Retired    Comment: TEFL teacher  Social Needs  . Financial resource strain: Not hard at all  . Food insecurity:    Worry: Never true    Inability: Never true  . Transportation needs:    Medical: No    Non-medical: No  Tobacco Use  . Smoking status: Never Smoker  . Smokeless tobacco: Never Used  Substance and Sexual Activity  . Alcohol use: No    Alcohol/week: 0.0 standard drinks  . Drug use: No  . Sexual activity: Not on file  Lifestyle  . Physical activity:    Days per week: 0 days    Minutes per session: 0 min  . Stress: Not at all  Relationships  . Social connections:    Talks on phone: More than three times a week    Gets together: More than three times a week    Attends religious service: Not on file    Active member of club or organization: Not on file    Attends meetings of clubs or organizations: Not on file    Relationship status: Married  Other Topics Concern  . Not on file  Social History Narrative   Lives at home with wife.      Family History  Problem Relation Age of Onset  . Lung cancer Father        smoker  . Throat cancer Father   . Kidney disease Father        nephrectomy & partial nephrectomy  . Breast cancer Mother   . Dementia Mother   . Heart attack Mother 31  . Prostate cancer Brother   . Heart disease Brother        x 3 (one brother with CAD in the 11s, another age 1s, third one 106s CABG)  . Diabetes Maternal Grandmother   . Heart attack Maternal Grandmother        in 32s   . Stroke Neg Hx     Review of Systems     Objective:  There were no vitals filed for this visit. BP Readings from Last 3 Encounters:  09/08/17  126/74  08/12/17 134/66  03/31/17 116/72   Wt Readings from Last 3 Encounters:  09/08/17 196 lb 12.8 oz (89.3 kg)  03/31/17 197 lb (89.4 kg)  02/22/17 200 lb 9.6 oz (91 kg)   There is no height or weight on file to calculate BMI.   Physical Exam         Assessment & Plan:    See Problem List for Assessment and Plan of chronic medical problems.   This encounter was created in error - please disregard.

## 2017-11-09 ENCOUNTER — Encounter: Payer: Medicare Other | Admitting: Internal Medicine

## 2017-11-23 ENCOUNTER — Other Ambulatory Visit: Payer: Self-pay | Admitting: Internal Medicine

## 2017-11-27 ENCOUNTER — Other Ambulatory Visit: Payer: Self-pay | Admitting: Internal Medicine

## 2017-11-27 NOTE — Telephone Encounter (Signed)
Last refill was 10/28/17 Last OV was 09/08/17 for acute and 03/31/17 for routine Next OV 11/30/17

## 2017-11-29 NOTE — Progress Notes (Signed)
Subjective:    Patient ID: Matthew [name], male    DOB: [birth year], 84 y.o.   MRN: [id number]  HPI The patient is here for an acute visit.  For at least two years he has pain over his "left breast" or left chest.  He was seen in the ED two years ago for this pain and had a cardiac stress test and everything was normal.  He continues to complain of pain in this area.  He does have dementia and is a poor historian regarding how often he feels it.  He does state that year he is tender when he pushes on it.  He can reproduce the pain here today.  Hyperlipidemia: He is taking his medication daily. He is compliant with a low fat/cholesterol diet. He is not exercising regularly. He denies myalgias.   Peripheral neuropathy:  He is taking lyrica and tramadol daily.  His pain is controlled.   Dementia:  He is taking aricept and namenda. He no longer sees neurology.  Per his daughter and wife there is been no major changes in his memory since his last visit.  He is sleeping good and eating good.  GERD:  He is taking his medication daily as prescribed.  He denies any GERD symptoms and feels his GERD is well controlled.   CKD: He is not following with nephrology.  He does not take any over-the-counter pain medication except for Tylenol.  chronic neck pain, headaches: He continues to have chronic neck pain.  He cervical fusion years ago.  He is taking tramadol daily, which does help.  He has chronic headaches related to the neck pain for which he has seen neurology and neurology just recommended continuing the tramadol.   Medications and allergies reviewed with patient and updated if appropriate.  Patient Active Problem List   Diagnosis Date Noted  . Chronic neck pain 09/08/2017  . GERD (gastroesophageal reflux disease) 04/01/2017  . Rectal bleeding 04/01/2017  . Chronic headaches 04/01/2017  . Mild aortic stenosis 05/12/2015  . Constipation 05/12/2015  . Essential hypertension   . Chest pain  04/30/2015  . CKD (chronic kidney disease) stage 3, GFR 30-59 ml/min (HCC) 04/30/2015  . Pancreatic cyst 02/12/2015  . Hereditary and idiopathic peripheral neuropathy 11/07/2014  . Moderate dementia without behavioral disturbance ([place]) 11/02/2009  . SKIN CANCER, HX OF 11/02/2009  . ANXIETY 02/03/2007  . HEMORRHOIDS, INTERNAL 02/03/2007  . ARTHRITIS 02/03/2007  . SLEEP APNEA 02/03/2007  . Hyperlipidemia 11/13/2006  . PVD 11/13/2006  . PROSTATE CANCER, HX OF 11/13/2006  . COLONIC POLYPS, HYPERPLASTIC 12/24/2003  . HIATAL HERNIA 12/24/2003  . Diverticulosis of large intestine 12/24/2003    Current Outpatient Medications on File Prior to Visit  Medication Sig Dispense Refill  . aspirin EC 81 MG tablet Take 81 mg by mouth daily.    . bisacodyl (DULCOLAX) 5 MG EC tablet Take 5 mg by mouth daily as needed for moderate constipation.    . diazepam (VALIUM) 5 MG tablet Take one pill one hour prior to MRI, may repeat x 1 if needed 2 tablet 0  . dimenhyDRINATE (DRAMAMINE) 50 MG tablet Take 50 mg by mouth every 8 (eight) hours as needed for nausea.    [name] donepezil (ARICEPT) 10 MG tablet Take 1 tablet (10 mg total) by mouth at bedtime. 90 tablet 3  . lidocaine (XYLOCAINE) 5 % ointment Apply 1 application topically as needed for moderate pain. Prescription faxed to [facility], 12 refills    .  memantine (NAMENDA) 10 MG tablet Take 1 tablet (10 mg total) by mouth 2 (two) times daily. 180 tablet 4  . mometasone (NASONEX) 50 MCG/ACT nasal spray Place 2 sprays into the nose daily. 17 g 12  . omeprazole (PRILOSEC) 40 MG capsule TAKE 1 CAPSULE BY MOUTH EVERY DAY 90 capsule 2  . polyethylene glycol (MIRALAX / GLYCOLAX) packet Take 17 g by mouth daily as needed (constipation).     . pregabalin (LYRICA) 75 MG capsule Take 1 capsule (75 mg total) by mouth 2 (two) times daily. 60 capsule 5  . ranitidine (ZANTAC) 150 MG tablet TAKE 1 TABLET BY MOUTH AT BEDTIME 90 tablet 1  . simvastatin (ZOCOR) 40  MG tablet TAKE 1 TABLET BY MOUTH DAILY AT 6 PM- OFFICE VISIT NEEDED FOR FURTHER REFILLS 90 tablet 0  . traMADol (ULTRAM) 50 MG tablet TAKE 1 TABLET(50 MG) BY MOUTH THREE TIMES DAILY AS NEEDED FOR MODERATE PAIN OR SEVERE PAIN 90 tablet 0   No current facility-administered medications on file prior to visit.     Past Medical History:  Diagnosis Date  . Alzheimer's dementia ([place])    "don't know what stage but it's not early" (04/30/2015)  . Anxiety   . Arthritis   . CAD (coronary artery disease)    a.  mild non obstructive CAD on cath 02/2011  . Cataract    NOS  . Chronic kidney disease   . Diverticulosis of colon   . Dysphagia    PMH of  . Hemorrhoids, internal   . Hiatal hernia   . History of colon polyps 2005   hyperplastic; Dr [name]  . Hyperlipidemia   . Hypertension   . Impotence of organic origin   . Metabolic syndrome X   . Prostate cancer ([place])    Dr [name]  . PVD (peripheral vascular disease) ([place])   . Schatzki's ring   . Skin cancer     Past Surgical History:  Procedure Laterality Date  . APPENDECTOMY  1950  . BACK SURGERY    . CERVICAL FUSION    . COLONOSCOPY W/ POLYPECTOMY  2005   Dr [name]  . EUS N/A 01/15/2015   Procedure: UPPER ENDOSCOPIC ULTRASOUND (EUS) RADIAL;  Surgeon: [name], MD;  Location: [facility] ENDOSCOPY;  Service: Endoscopy;  Laterality: N/A;  . LEFT HEART CATHETERIZATION WITH CORONARY ANGIOGRAM N/A 03/02/2012   Procedure: LEFT HEART CATHETERIZATION WITH CORONARY ANGIOGRAM;  Surgeon: [name], MD;  Location: [facility] CATH LAB;  Service: Cardiovascular;  Laterality: N/A;  . [place]; no chemo or radiation  . SHOULDER SURGERY     right  . TONSILLECTOMY      Social History   Socioeconomic History  . Marital status: Married    Spouse name: [name]  . Number of children: 4  . Years of education: 42  . Highest education level: Not on file  Occupational History  . Occupation: Retired    Comment:  TEFL teacher  Social Needs  . Financial resource strain: Not hard at all  . Food insecurity:    Worry: Never true    Inability: Never true  . Transportation needs:    Medical: No    Non-medical: No  Tobacco Use  . Smoking status: Never Smoker  . Smokeless tobacco: Never Used  Substance and Sexual Activity  . Alcohol use: No    Alcohol/week: 0.0 standard drinks  . Drug use: No  . Sexual activity: Not on file  Lifestyle  . Physical activity:    Days per week: 0 days    Minutes per session: 0 min  . Stress: Not at all  Relationships  . Social connections:    Talks on phone: More than three times a week    Gets together: More than three times a week    Attends religious service: Not on file    Active member of club or organization: Not on file    Attends meetings of clubs or organizations: Not on file    Relationship status: Married  Other Topics Concern  . Not on file  Social History Narrative   Lives at home with wife.      Family History  Problem Relation Age of Onset  . Lung cancer Father        smoker  . Throat cancer Father   . Kidney disease Father        nephrectomy & partial nephrectomy  . Breast cancer Mother   . Dementia Mother   . Heart attack Mother 65  . Prostate cancer Brother   . Heart disease Brother        x 3 (one brother with CAD in the 2s, another age 70s, third one 46s CABG)  . Diabetes Maternal Grandmother   . Heart attack Maternal Grandmother        in 19s  . Stroke Neg Hx     Review of Systems  Constitutional: Negative for chills and fever.  Respiratory: Negative for cough, shortness of breath and wheezing.   Cardiovascular: Negative for chest pain, palpitations and leg swelling.  Musculoskeletal: Positive for neck pain.  Neurological: Positive for headaches. Negative for dizziness and light-headedness.       Objective:   Vitals:   11/30/17 0905  BP: 110/64  Pulse: 69  Resp: 16  Temp: 97.8 F (36.6 C)  SpO2:  97%   BP Readings from Last 3 Encounters:  11/30/17 110/64  09/08/17 126/74  08/12/17 134/66   Wt Readings from Last 3 Encounters:  11/30/17 197 lb (89.4 kg)  09/08/17 196 lb 12.8 oz (89.3 kg)  03/31/17 197 lb (89.4 kg)   Body mass index is 29.09 kg/m.   Physical Exam    Constitutional: Appears well-developed and well-nourished. No distress.  HENT:  Head: Normocephalic and atraumatic.  Neck: Neck supple. No tracheal deviation present. No thyromegaly present.  No cervical lymphadenopathy Cardiovascular: Normal rate, regular rhythm and normal heart sounds.   No murmur heard. No carotid bruit .  No edema Pulmonary/Chest: Left upper chest pain reproducible with palpation near shoulder-not increased with movement of left arm.  Effort normal and breath sounds normal. No respiratory distress. No has no wheezes. No rales.  Skin: Skin is warm and dry. Not diaphoretic.  Psychiatric: Normal mood and affect. Behavior is normal.       Assessment & Plan:    See Problem List for Assessment and Plan of chronic medical problems.

## 2017-11-30 ENCOUNTER — Other Ambulatory Visit (INDEPENDENT_AMBULATORY_CARE_PROVIDER_SITE_OTHER): Payer: Medicare Other

## 2017-11-30 ENCOUNTER — Ambulatory Visit: Payer: Medicare Other | Admitting: Internal Medicine

## 2017-11-30 ENCOUNTER — Encounter: Payer: Self-pay | Admitting: Internal Medicine

## 2017-11-30 VITALS — BP 110/64 | HR 69 | Temp 97.8°F | Resp 16 | Ht 69.0 in | Wt 197.0 lb

## 2017-11-30 DIAGNOSIS — G8929 Other chronic pain: Secondary | ICD-10-CM

## 2017-11-30 DIAGNOSIS — M542 Cervicalgia: Secondary | ICD-10-CM

## 2017-11-30 DIAGNOSIS — E7849 Other hyperlipidemia: Secondary | ICD-10-CM

## 2017-11-30 DIAGNOSIS — Z23 Encounter for immunization: Secondary | ICD-10-CM | POA: Diagnosis not present

## 2017-11-30 DIAGNOSIS — N183 Chronic kidney disease, stage 3 unspecified: Secondary | ICD-10-CM

## 2017-11-30 DIAGNOSIS — F0390 Unspecified dementia without behavioral disturbance: Secondary | ICD-10-CM | POA: Diagnosis not present

## 2017-11-30 DIAGNOSIS — I10 Essential (primary) hypertension: Secondary | ICD-10-CM

## 2017-11-30 DIAGNOSIS — K219 Gastro-esophageal reflux disease without esophagitis: Secondary | ICD-10-CM | POA: Diagnosis not present

## 2017-11-30 DIAGNOSIS — R739 Hyperglycemia, unspecified: Secondary | ICD-10-CM

## 2017-11-30 DIAGNOSIS — F03B0 Unspecified dementia, moderate, without behavioral disturbance, psychotic disturbance, mood disturbance, and anxiety: Secondary | ICD-10-CM

## 2017-11-30 DIAGNOSIS — G609 Hereditary and idiopathic neuropathy, unspecified: Secondary | ICD-10-CM

## 2017-11-30 DIAGNOSIS — R079 Chest pain, unspecified: Secondary | ICD-10-CM

## 2017-11-30 LAB — HEMOGLOBIN A1C: HEMOGLOBIN A1C: 5.9 % (ref 4.6–6.5)

## 2017-11-30 LAB — CBC WITH DIFFERENTIAL/PLATELET
BASOS PCT: 1.1 % (ref 0.0–3.0)
Basophils Absolute: 0.1 10*3/uL (ref 0.0–0.1)
EOS PCT: 1.9 % (ref 0.0–5.0)
Eosinophils Absolute: 0.1 10*3/uL (ref 0.0–0.7)
HCT: 43.9 % (ref 39.0–52.0)
Hemoglobin: 14.8 g/dL (ref 13.0–17.0)
LYMPHS ABS: 1.6 10*3/uL (ref 0.7–4.0)
Lymphocytes Relative: 26 % (ref 12.0–46.0)
MCHC: 33.8 g/dL (ref 30.0–36.0)
MCV: 89.5 fl (ref 78.0–100.0)
MONO ABS: 0.6 10*3/uL (ref 0.1–1.0)
Monocytes Relative: 9 % (ref 3.0–12.0)
NEUTROS PCT: 62 % (ref 43.0–77.0)
Neutro Abs: 3.8 10*3/uL (ref 1.4–7.7)
Platelets: 163 10*3/uL (ref 150.0–400.0)
RBC: 4.91 Mil/uL (ref 4.22–5.81)
RDW: 13.6 % (ref 11.5–15.5)
WBC: 6.1 10*3/uL (ref 4.0–10.5)

## 2017-11-30 LAB — COMPREHENSIVE METABOLIC PANEL
ALT: 11 U/L (ref 0–53)
AST: 14 U/L (ref 0–37)
Albumin: 4.4 g/dL (ref 3.5–5.2)
Alkaline Phosphatase: 39 U/L (ref 39–117)
BUN: 19 mg/dL (ref 6–23)
CHLORIDE: 106 meq/L (ref 96–112)
CO2: 29 meq/L (ref 19–32)
Calcium: 9.5 mg/dL (ref 8.4–10.5)
Creatinine, Ser: 1.58 mg/dL — ABNORMAL HIGH (ref 0.40–1.50)
GFR: 44.43 mL/min — ABNORMAL LOW (ref >60.00)
GLUCOSE: 95 mg/dL (ref 70–99)
POTASSIUM: 4 meq/L (ref 3.5–5.1)
Sodium: 143 mEq/L (ref 135–145)
Total Bilirubin: 0.6 mg/dL (ref 0.2–1.2)
Total Protein: 7 g/dL (ref 6.0–8.3)

## 2017-11-30 NOTE — Patient Instructions (Addendum)
  Tests ordered today. Your results will be released to MyChart (or called to you) after review, usually within 72hours after test completion. If any changes need to be made, you will be notified at that same time.  Flu immunization administered today.    Medications reviewed and updated.  Changes include :   none    Please followup in 6 months   

## 2017-11-30 NOTE — Assessment & Plan Note (Signed)
Stable Independent of ADLs Eating well, sleeping well No behavioral disturbance Continue Aricept and Namenda

## 2017-11-30 NOTE — Assessment & Plan Note (Signed)
Chronic neck pain s/p cervical fusion years ago Has headaches related to neck pain Refer to ortho Taking tramadol - will continue

## 2017-11-30 NOTE — Assessment & Plan Note (Signed)
a1c

## 2017-11-30 NOTE — Assessment & Plan Note (Signed)
Lipids have been well controlled Continue statin 

## 2017-11-30 NOTE — Assessment & Plan Note (Signed)
cmp

## 2017-11-30 NOTE — Assessment & Plan Note (Signed)
He had intermittent left-sided chest pain for over 2 years.  Work-up 2 years ago for cardiac cause negative-stress test negative Pain is reproducible and I can reproduce it on his exam today Likely musculoskeletal Will discuss with Matthew [name] be coming from his neck Continue tramadol and Lyrica

## 2017-11-30 NOTE — Assessment & Plan Note (Signed)
BP well controlled Current regimen effective and well tolerated Continue current medications at current doses cmp  

## 2017-11-30 NOTE — Assessment & Plan Note (Signed)
GERD controlled Continue daily medication  

## 2017-11-30 NOTE — Assessment & Plan Note (Signed)
Pain controlled with Lyrica and tramadol Continue above

## 2017-12-01 ENCOUNTER — Encounter: Payer: Self-pay | Admitting: Internal Medicine

## 2017-12-02 ENCOUNTER — Encounter: Payer: Self-pay | Admitting: Internal Medicine

## 2017-12-14 DIAGNOSIS — M542 Cervicalgia: Secondary | ICD-10-CM | POA: Diagnosis not present

## 2018-01-18 DIAGNOSIS — L570 Actinic keratosis: Secondary | ICD-10-CM | POA: Diagnosis not present

## 2018-01-18 DIAGNOSIS — Z85820 Personal history of malignant melanoma of skin: Secondary | ICD-10-CM | POA: Diagnosis not present

## 2018-01-18 DIAGNOSIS — C44612 Basal cell carcinoma of skin of right upper limb, including shoulder: Secondary | ICD-10-CM | POA: Diagnosis not present

## 2018-01-18 DIAGNOSIS — L853 Xerosis cutis: Secondary | ICD-10-CM | POA: Diagnosis not present

## 2018-01-18 DIAGNOSIS — L821 Other seborrheic keratosis: Secondary | ICD-10-CM | POA: Diagnosis not present

## 2018-02-07 ENCOUNTER — Other Ambulatory Visit: Payer: Self-pay | Admitting: Internal Medicine

## 2018-02-14 NOTE — Progress Notes (Signed)
Subjective:    Patient ID: Matthew [name], male    DOB: 08-Nov-1930, 85 y.o.   MRN: [id number]  HPI The patient is here for an acute visit.  He is here with his wife and 2 daughters to discuss some of his pain.  He has dementia and they provide the history.  He has hand and knee arthritis.  He has seen orthopedics in the past.  He is currently taking tramadol and Lyrica.  His pain is not always controlled.  His wife was wondering what else could be done to help with his pain.  He is fairly sedentary.   Eat well.  Not exercising much.  Sleeping well.   He no longer drives and is not able to drive.   Medications and allergies reviewed with patient and updated if appropriate.  Patient Active Problem List   Diagnosis Date Noted  . Primary osteoarthritis involving multiple joints 02/15/2018  . Hyperglycemia 11/30/2017  . Chronic neck pain 09/08/2017  . GERD (gastroesophageal reflux disease) 04/01/2017  . Rectal bleeding 04/01/2017  . Chronic headaches 04/01/2017  . Mild aortic stenosis 05/12/2015  . Constipation 05/12/2015  . Essential hypertension   . Left-sided chest pain 04/30/2015  . CKD (chronic kidney disease) stage 3, GFR 30-59 ml/min (HCC) 04/30/2015  . Pancreatic cyst 02/12/2015  . Hereditary and idiopathic peripheral neuropathy 11/07/2014  . Moderate dementia without behavioral disturbance ([place]) 11/02/2009  . SKIN CANCER, HX OF 11/02/2009  . ANXIETY 02/03/2007  . HEMORRHOIDS, INTERNAL 02/03/2007  . ARTHRITIS 02/03/2007  . SLEEP APNEA 02/03/2007  . Hyperlipidemia 11/13/2006  . PVD 11/13/2006  . PROSTATE CANCER, HX OF 11/13/2006  . COLONIC POLYPS, HYPERPLASTIC 12/24/2003  . HIATAL HERNIA 12/24/2003  . Diverticulosis of large intestine 12/24/2003    Current Outpatient Medications on File Prior to Visit  Medication Sig Dispense Refill  . aspirin EC 81 MG tablet Take 81 mg by mouth daily.    . bisacodyl (DULCOLAX) 5 MG EC tablet Take 5 mg by mouth daily as needed for  moderate constipation.    . dimenhyDRINATE (DRAMAMINE) 50 MG tablet Take 50 mg by mouth every 8 (eight) hours as needed for nausea.    [name] donepezil (ARICEPT) 10 MG tablet Take 1 tablet (10 mg total) by mouth at bedtime. 90 tablet 3  . lidocaine (XYLOCAINE) 5 % ointment Apply 1 application topically as needed for moderate pain. Prescription faxed to [place], 12 refills    . memantine (NAMENDA) 10 MG tablet Take 1 tablet (10 mg total) by mouth 2 (two) times daily. 180 tablet 4  . mometasone (NASONEX) 50 MCG/ACT nasal spray Place 2 sprays into the nose daily. 17 g 12  . polyethylene glycol (MIRALAX / GLYCOLAX) packet Take 17 g by mouth daily as needed (constipation).      No current facility-administered medications on file prior to visit.     Past Medical History:  Diagnosis Date  . Alzheimer's dementia ([place])    "don't know what stage but it's not early" (04/30/2015)  . Anxiety   . Arthritis   . CAD (coronary artery disease)    a.  mild non obstructive CAD on cath 02/2011  . Cataract    NOS  . Chronic kidney disease   . Diverticulosis of colon   . Dysphagia    PMH of  . Hemorrhoids, internal   . Hiatal hernia   . History of colon polyps 2005   hyperplastic; Dr [name]  . Hyperlipidemia   .  Hypertension   . Impotence of organic origin   . Metabolic syndrome X   . Prostate cancer ([place])    Dr [name]  . PVD (peripheral vascular disease) ([place])   . Schatzki's ring   . Skin cancer     Past Surgical History:  Procedure Laterality Date  . APPENDECTOMY  1950  . BACK SURGERY    . CERVICAL FUSION    . COLONOSCOPY W/ POLYPECTOMY  2005   Dr [name]  . EUS N/A 01/15/2015   Procedure: UPPER ENDOSCOPIC ULTRASOUND (EUS) RADIAL;  Surgeon: [name], MD;  Location: [facility] ENDOSCOPY;  Service: Endoscopy;  Laterality: N/A;  . LEFT HEART CATHETERIZATION WITH CORONARY ANGIOGRAM N/A 03/02/2012   Procedure: LEFT HEART CATHETERIZATION WITH CORONARY ANGIOGRAM;  Surgeon:  [name], MD;  Location: [facility] CATH LAB;  Service: Cardiovascular;  Laterality: N/A;  . [place]; no chemo or radiation  . SHOULDER SURGERY     right  . TONSILLECTOMY      Social History   Socioeconomic History  . Marital status: Married    Spouse name: [name]  . Number of children: 4  . Years of education: 25  . Highest education level: Not on file  Occupational History  . Occupation: Retired    Comment: TEFL teacher  Social Needs  . Financial resource strain: Not hard at all  . Food insecurity:    Worry: Never true    Inability: Never true  . Transportation needs:    Medical: No    Non-medical: No  Tobacco Use  . Smoking status: Never Smoker  . Smokeless tobacco: Never Used  Substance and Sexual Activity  . Alcohol use: No    Alcohol/week: 0.0 standard drinks  . Drug use: No  . Sexual activity: Not on file  Lifestyle  . Physical activity:    Days per week: 0 days    Minutes per session: 0 min  . Stress: Not at all  Relationships  . Social connections:    Talks on phone: More than three times a week    Gets together: More than three times a week    Attends religious service: Not on file    Active member of club or organization: Not on file    Attends meetings of clubs or organizations: Not on file    Relationship status: Married  Other Topics Concern  . Not on file  Social History Narrative   Lives at home with wife.      Family History  Problem Relation Age of Onset  . Lung cancer Father        smoker  . Throat cancer Father   . Kidney disease Father        nephrectomy & partial nephrectomy  . Breast cancer Mother   . Dementia Mother   . Heart attack Mother 38  . Prostate cancer Brother   . Heart disease Brother        x 3 (one brother with CAD in the 60s, another age 69s, third one 29s CABG)  . Diabetes Maternal Grandmother   . Heart attack Maternal Grandmother        in 12s  . Stroke Neg Hx      Review of Systems     Objective:   Vitals:   02/15/18 0952  BP: 108/60  Pulse: (!) 58  Resp: 16  Temp: 97.9 F (36.6 C)  SpO2: 96%   BP Readings from Last 3  Encounters:  02/15/18 108/60  11/30/17 110/64  09/08/17 126/74   Wt Readings from Last 3 Encounters:  02/15/18 205 lb 12.8 oz (93.4 kg)  11/30/17 197 lb (89.4 kg)  09/08/17 196 lb 12.8 oz (89.3 kg)   Body mass index is 30.39 kg/m.   Physical Exam         Assessment & Plan:    15 minutes were spent face-to-face with the patient, over 50% of which was spent counseling regarding treatment for his osteoarthritis   See Problem List for Assessment and Plan of chronic medical problems.

## 2018-02-15 ENCOUNTER — Encounter: Payer: Self-pay | Admitting: Internal Medicine

## 2018-02-15 ENCOUNTER — Ambulatory Visit: Payer: Medicare Other | Admitting: Internal Medicine

## 2018-02-15 VITALS — BP 108/60 | HR 58 | Temp 97.9°F | Resp 16 | Ht 69.0 in | Wt 205.8 lb

## 2018-02-15 DIAGNOSIS — M150 Primary generalized (osteo)arthritis: Secondary | ICD-10-CM | POA: Diagnosis not present

## 2018-02-15 DIAGNOSIS — M8949 Other hypertrophic osteoarthropathy, multiple sites: Secondary | ICD-10-CM | POA: Insufficient documentation

## 2018-02-15 DIAGNOSIS — M159 Polyosteoarthritis, unspecified: Secondary | ICD-10-CM

## 2018-02-15 MED ORDER — PREGABALIN 75 MG PO CAPS: 75.0000 mg | ORAL_CAPSULE | Freq: Two times a day (BID) | ORAL | 1 refills | Status: DC

## 2018-02-15 MED ORDER — TRAMADOL HCL 50 MG PO TABS: ORAL_TABLET | ORAL | 1 refills | Status: DC

## 2018-02-15 MED ORDER — SIMVASTATIN 40 MG PO TABS: 40.0000 mg | ORAL_TABLET | Freq: Every day | ORAL | 1 refills | Status: DC

## 2018-02-15 MED ORDER — OMEPRAZOLE 40 MG PO CPDR: DELAYED_RELEASE_CAPSULE | ORAL | 1 refills | Status: DC

## 2018-02-15 NOTE — Assessment & Plan Note (Signed)
He has arthritis of his neck, knee and hands-those are what causes most of his pain Currently on tramadol and he can take up to 3 a day Also taking Lyrica twice daily Will continue above Advised adding Tylenol at least twice daily Discussed topical medications such as Aspercreme or Biofreeze

## 2018-03-22 ENCOUNTER — Other Ambulatory Visit: Payer: Self-pay | Admitting: Diagnostic Neuroimaging

## 2018-07-26 DIAGNOSIS — L821 Other seborrheic keratosis: Secondary | ICD-10-CM | POA: Diagnosis not present

## 2018-07-26 DIAGNOSIS — Z85828 Personal history of other malignant neoplasm of skin: Secondary | ICD-10-CM | POA: Diagnosis not present

## 2018-07-26 DIAGNOSIS — Z85820 Personal history of malignant melanoma of skin: Secondary | ICD-10-CM | POA: Diagnosis not present

## 2018-07-26 DIAGNOSIS — D225 Melanocytic nevi of trunk: Secondary | ICD-10-CM | POA: Diagnosis not present

## 2018-08-11 ENCOUNTER — Other Ambulatory Visit: Payer: Self-pay

## 2018-08-11 ENCOUNTER — Emergency Department (HOSPITAL_COMMUNITY)
Admission: EM | Admit: 2018-08-11 | Discharge: 2018-08-11 | Disposition: A | Discharge status: Home / Self Care | Payer: Medicare Other | Attending: Emergency Medicine | Admitting: Emergency Medicine

## 2018-08-11 ENCOUNTER — Encounter (HOSPITAL_COMMUNITY): Payer: Self-pay

## 2018-08-11 DIAGNOSIS — Z7982 Long term (current) use of aspirin: Secondary | ICD-10-CM | POA: Insufficient documentation

## 2018-08-11 DIAGNOSIS — F0280 Dementia in other diseases classified elsewhere without behavioral disturbance: Secondary | ICD-10-CM | POA: Diagnosis not present

## 2018-08-11 DIAGNOSIS — I129 Hypertensive chronic kidney disease with stage 1 through stage 4 chronic kidney disease, or unspecified chronic kidney disease: Secondary | ICD-10-CM | POA: Diagnosis not present

## 2018-08-11 DIAGNOSIS — N183 Chronic kidney disease, stage 3 (moderate): Secondary | ICD-10-CM | POA: Insufficient documentation

## 2018-08-11 DIAGNOSIS — G309 Alzheimer's disease, unspecified: Secondary | ICD-10-CM | POA: Diagnosis not present

## 2018-08-11 DIAGNOSIS — Z79899 Other long term (current) drug therapy: Secondary | ICD-10-CM | POA: Diagnosis not present

## 2018-08-11 DIAGNOSIS — I2510 Atherosclerotic heart disease of native coronary artery without angina pectoris: Secondary | ICD-10-CM | POA: Diagnosis not present

## 2018-08-11 DIAGNOSIS — M25562 Pain in left knee: Secondary | ICD-10-CM | POA: Insufficient documentation

## 2018-08-11 NOTE — ED Triage Notes (Signed)
patint complains of left knee pain and swelling for the past few days, no trauma. Pain with any ROM. ALSO DEVELOPED REDNESS TO LEFT SIDE OF NECK, NO ITCHING, NO BURNING

## 2018-08-11 NOTE — ED Provider Notes (Signed)
[facility] EMERGENCY DEPARTMENT Provider Note   CSN: [id number] Arrival date & time: 08/11/18  1138    History   Chief Complaint No chief complaint on file.   HPI Matthew [name] is a 85 y.o. male.     HPI Patient has Alzheimer's dementia.  His daughter is the primary historian.  She reports he has been well.  He started developing left knee pain about 2 days ago.  No traumatic injuries.  He started having a lot of pain with ambulation and hobbling on it.  It also had swelling and she noted redness.  That however does look better today.  He did get some pain relief with tramadol which he takes on an as-needed basis for pain issues. Past Medical History:  Diagnosis Date  . Alzheimer's dementia ([place])    "don't know what stage but it's not early" (04/30/2015)  . Anxiety   . Arthritis   . CAD (coronary artery disease)    a.  mild non obstructive CAD on cath 02/2011  . Cataract    NOS  . Chronic kidney disease   . Diverticulosis of colon   . Dysphagia    PMH of  . Hemorrhoids, internal   . Hiatal hernia   . History of colon polyps 2005   hyperplastic; Dr [name]  . Hyperlipidemia   . Hypertension   . Impotence of organic origin   . Metabolic syndrome X   . Prostate cancer ([place])    Dr [name]  . PVD (peripheral vascular disease) ([place])   . Schatzki's ring   . Skin cancer     Patient Active Problem List   Diagnosis Date Noted  . Primary osteoarthritis involving multiple joints 02/15/2018  . Hyperglycemia 11/30/2017  . Chronic neck pain 09/08/2017  . GERD (gastroesophageal reflux disease) 04/01/2017  . Rectal bleeding 04/01/2017  . Chronic headaches 04/01/2017  . Mild aortic stenosis 05/12/2015  . Constipation 05/12/2015  . Essential hypertension   . Left-sided chest pain 04/30/2015  . CKD (chronic kidney disease) stage 3, GFR 30-59 ml/min (H[facility]) 04/30/2015  . Pancreatic cyst 02/12/2015  . Hereditary and idiopathic peripheral neuropathy  11/07/2014  . Moderate dementia without behavioral disturbance ([place]) 11/02/2009  . SKIN CANCER, HX OF 11/02/2009  . ANXIETY 02/03/2007  . HEMORRHOIDS, INTERNAL 02/03/2007  . ARTHRITIS 02/03/2007  . SLEEP APNEA 02/03/2007  . Hyperlipidemia 11/13/2006  . PVD 11/13/2006  . PROSTATE CANCER, HX OF 11/13/2006  . COLONIC POLYPS, HYPERPLASTIC 12/24/2003  . HIATAL HERNIA 12/24/2003  . Diverticulosis of large intestine 12/24/2003    Past Surgical History:  Procedure Laterality Date  . APPENDECTOMY  1950  . BACK SURGERY    . CERVICAL FUSION    . COLONOSCOPY W/ POLYPECTOMY  2005   Dr [name]  . EUS N/A 01/15/2015   Procedure: UPPER ENDOSCOPIC ULTRASOUND (EUS) RADIAL;  Surgeon: [name], MD;  Location: [facility] ENDOSCOPY;  Service: Endoscopy;  Laterality: N/A;  . LEFT HEART CATHETERIZATION WITH CORONARY ANGIOGRAM N/A 03/02/2012   Procedure: LEFT HEART CATHETERIZATION WITH CORONARY ANGIOGRAM;  Surgeon: [name], MD;  Location: [facility] CATH LAB;  Service: Cardiovascular;  Laterality: N/A;  . [place]; no chemo or radiation  . SHOULDER SURGERY     right  . TONSILLECTOMY          Home Medications    Prior to Admission medications   Medication Sig Start Date End Date Taking? Authorizing Provider  aspirin EC 81 MG  tablet Take 81 mg by mouth daily.    [provider]  bisacodyl (DULCOLAX) 5 MG EC tablet Take 5 mg by mouth daily as needed for moderate constipation.    [provider]  dimenhyDRINATE (DRAMAMINE) 50 MG tablet Take 50 mg by mouth every 8 (eight) hours as needed for nausea.    [provider]  donepezil (ARICEPT) 10 MG tablet Take 1 tablet (10 mg total) by mouth at bedtime. 02/22/17   Penumalli, Earlean Polka, MD  lidocaine (XYLOCAINE) 5 % ointment Apply 1 application topically as needed for moderate pain. Prescription faxed to [place], 12 refills 02/19/15   [provider]  memantine (NAMENDA) 10 MG tablet  Take 1 tablet (10 mg total) by mouth 2 (two) times daily. 02/22/17   Penumalli, Earlean Polka, MD  mometasone (NASONEX) 50 MCG/ACT nasal spray Place 2 sprays into the nose daily. 08/12/17   Tasia Catchings, Amy V, PA-C  omeprazole (PRILOSEC) 40 MG capsule TAKE 1 CAPSULE BY MOUTH EVERY DAY 02/15/18   Burns, Claudina Lick, MD  polyethylene glycol (MIRALAX / GLYCOLAX) packet Take 17 g by mouth daily as needed (constipation).     [provider]  pregabalin (LYRICA) 75 MG capsule Take 1 capsule (75 mg total) by mouth 2 (two) times daily. 02/15/18   [name], MD  simvastatin (ZOCOR) 40 MG tablet Take 1 tablet (40 mg total) by mouth daily at 6 PM. 02/15/18   Burns, Claudina Lick, MD  traMADol (ULTRAM) 50 MG tablet TAKE 1 TABLET(50 MG) BY MOUTH THREE TIMES DAILY AS NEEDED FOR MODERATE PAIN OR SEVERE PAIN 02/15/18   [name], MD    Family History Family History  Problem Relation Age of Onset  . Lung cancer Father        smoker  . Throat cancer Father   . Kidney disease Father        nephrectomy & partial nephrectomy  . Breast cancer Mother   . Dementia Mother   . Heart attack Mother 31  . Prostate cancer Brother   . Heart disease Brother        x 3 (one brother with CAD in the 70s, another age 52s, third one 23s CABG)  . Diabetes Maternal Grandmother   . Heart attack Maternal Grandmother        in 27s  . Stroke Neg Hx     Social History Social History   Tobacco Use  . Smoking status: Never Smoker  . Smokeless tobacco: Never Used  Substance Use Topics  . Alcohol use: No    Alcohol/week: 0.0 standard drinks  . Drug use: No     Allergies   Patient has no known allergies.   Review of Systems Review of Systems 10 Systems reviewed and are negative for acute change except as noted in the HPI. Review of systems is as per discussion with the patient's daughter.  Physical Exam Updated Vital Signs BP 124/75 (BP Location: Right Arm)   Pulse 65   Temp 97.9 F (36.6 C) (Oral)   Resp 16   SpO2 96%    Physical Exam Constitutional:      Comments: Patient is alert and pleasant and interactive.  No acute distress.  HENT:     Head: Normocephalic and atraumatic.  Eyes:     Extraocular Movements: Extraocular movements intact.  Cardiovascular:     Rate and Rhythm: Normal rate and regular rhythm.  Pulmonary:     Effort: Pulmonary effort  is normal.     Breath sounds: Normal breath sounds.  Abdominal:     General: There is no distension.     Palpations: Abdomen is soft.     Tenderness: There is no abdominal tenderness. There is no guarding.     Comments: No inguinal lymphadenopathy, no streaking erythema up the medial thigh.  Groin area is clean and dry without rash.  Musculoskeletal:     Comments: At this time, the knees do objectively appear symmetric to me possibly slight effusion of the left but very mild.  No erythema at this time.  Mild to moderate arthritic changes but still maintaining fair bony contours of the knee.  Calf is soft and nontender.  There is no swelling of the lower legs or feet.  Feet are in good condition without wounds or erythema.  Distal pulses are 2+ and symmetric.  Patient does have some lateral joint line pain with deep forced flexion of the left knee.  Neurological:     General: No focal deficit present.     Comments: Patient is alert and interactive cheerful.  Cooperative all movements are coordinated and symmetric.  Psychiatric:        Mood and Affect: Mood normal.      ED Treatments / Results  Labs (all labs ordered are listed, but only abnormal results are displayed) Labs Reviewed - No data to display  EKG None  Radiology No results found.  Procedures Procedures (including critical care time)  Medications Ordered in ED Medications - No data to display   Initial Impression / Assessment and Plan / ED Course  I have reviewed the triage vital signs and the nursing notes.  Pertinent labs & imaging results that were available during my care of  the patient were reviewed by me and considered in my medical decision making (see chart for details).       Patient is alert and nontoxic.  He is clinically well in appearance.  At this time, knee exam is fairly benign.  He does have reproducible pain with deep flexion and pain that is exacerbated by ambulation.  I have high suspicion for arthritic pain.  Daughter is counseled on using acetaminophen and tramadol for pain control along with icing and elevation and a knee sleeve.  Turn precautions have been reviewed.  Plan is for follow-up with orthopedics.  Final Clinical Impressions(s) / ED Diagnoses   Final diagnoses:  Acute pain of left knee    ED Discharge Orders    None       Charlesetta Shanks, MD 08/11/18 1300

## 2018-08-11 NOTE — ED Notes (Signed)
Pt discharge paperwork reviewed. Pt verbalized understanding of instructions. Pt unable to sign d/t signature pad not working. Pt discharged.

## 2018-08-11 NOTE — Discharge Instructions (Signed)
1.  Call orthopedics on Monday to schedule a follow-up appointment.  You may benefit from a joint injection. 2.  Elevate the knee and ice as much as possible over the next couple of days.  Make sure there is good padding between ice packs in your skin. 3.  Wear a knee sleeve when you are up and walking about. 4.  Take 2 extra strength Tylenol every 6 hours as needed for pain control.  You may add 1-2 tramadol tablets as needed. 5.  Return to the emergency department if you develop redness, increased swelling, increasing pain despite treatment, fever chills or other concerning symptoms.

## 2018-10-01 ENCOUNTER — Other Ambulatory Visit: Payer: Self-pay | Admitting: Internal Medicine

## 2018-10-01 NOTE — Telephone Encounter (Signed)
Last OV 02/15/18 Next OV NA Last RF 07/16/18

## 2018-10-29 ENCOUNTER — Other Ambulatory Visit: Payer: Self-pay | Admitting: Internal Medicine

## 2018-10-30 ENCOUNTER — Other Ambulatory Visit: Payer: Self-pay | Admitting: Internal Medicine

## 2018-10-31 NOTE — Telephone Encounter (Signed)
His last follow up was 11/30/17 Last RF 06/14/18

## 2018-11-11 ENCOUNTER — Other Ambulatory Visit: Payer: Self-pay | Admitting: Internal Medicine

## 2018-11-12 ENCOUNTER — Other Ambulatory Visit: Payer: Self-pay | Admitting: Internal Medicine

## 2018-12-11 ENCOUNTER — Other Ambulatory Visit: Payer: Self-pay | Admitting: Internal Medicine

## 2019-01-02 ENCOUNTER — Other Ambulatory Visit: Payer: Self-pay | Admitting: Internal Medicine

## 2019-01-03 NOTE — Telephone Encounter (Signed)
Done erx 

## 2019-01-10 ENCOUNTER — Other Ambulatory Visit: Payer: Self-pay | Admitting: Internal Medicine

## 2019-01-16 NOTE — Progress Notes (Signed)
Virtual Visit via Video Note  I connected with Matthew [name] on 01/17/19 at 10:45 AM EST by a video enabled telemedicine application and verified that I am speaking with the correct person using two identifiers.   I discussed the limitations of evaluation and management by telemedicine and the availability of in person appointments. The patient expressed understanding and agreed to proceed.  Present for the visit:  Myself, Dr Billey Gosling, Matthew [name], his wife Matthew [name] and his daugher.  The patient is currently at home and I am in the office.    No referring provider.    History of Present Illness: He is here for follow up of his chronic medical conditions.   His daughter provides most of the history secondary to his dementia.   He has different family members present at all times to help care for him and his wife.  He is eating well and drinking plenty of fluids.  Someone is preparing and giving him his medications.  Knee, neck osteoarthritis:  He takes tramadol and lyrica.  He has seen ortho in the past.  His pain at this point is controlled.  His family gives him his medication.  Peripheral neuropathy:  He is taking lyrica and tramadol.  At one point they were waiting for the Lyrica to be refilled and he was only taking the Lyrica once a day.  He did not have any additional complaints of pain so they have been continuing the Lyrica once a day.  Hyperlipidemia: He is taking his medication daily. He is compliant with a low fat/cholesterol diet. He denies myalgias.   GERD: His daughter stopped his stomach medication and he has not had any complaints of reflux or heartburn.  CKD: He does not take any NSAIDs.  He is eating and drinking well.  Moderated dementia w/o behavioral disturbance:  He is no longer taking aricept and namenda.  He has not seen neurology recently.  His family is unsure with him not taking the medication has made things any worse or not, but he does seem to feel better  overall being off some of the medication.    ROS: Not able to be obtained secondary to patient's dementia  Social History   Socioeconomic History  . Marital status: Married    Spouse name: [name]  . Number of children: 4  . Years of education: 50  . Highest education level: Not on file  Occupational History  . Occupation: Retired    Comment: TEFL teacher  Tobacco Use  . Smoking status: Never Smoker  . Smokeless tobacco: Never Used  Substance and Sexual Activity  . Alcohol use: No    Alcohol/week: 0.0 standard drinks  . Drug use: No  . Sexual activity: Not on file  Other Topics Concern  . Not on file  Social History Narrative   Lives at home with wife.     Social Determinants of Health   Financial Resource Strain:   . Difficulty of Paying Living Expenses: Not on file  Food Insecurity:   . Worried About Charity fundraiser in the Last Year: Not on file  . Ran Out of Food in the Last Year: Not on file  Transportation Needs:   . Lack of Transportation (Medical): Not on file  . Lack of Transportation (Non-Medical): Not on file  Physical Activity:   . Days of Exercise per Week: Not on file  . Minutes of Exercise per Session: Not on file  Stress:   .  Feeling of Stress : Not on file  Social Connections:   . Frequency of Communication with Friends and Family: Not on file  . Frequency of Social Gatherings with Friends and Family: Not on file  . Attends Religious Services: Not on file  . Active Member of Clubs or Organizations: Not on file  . Attends Archivist Meetings: Not on file  . Marital Status: Not on file     Observations/Objective: Appears well in NAD Breathing normally Skin appears warm and dry Mood and affect normal  Assessment and Plan:  See Problem List for Assessment and Plan of chronic medical problems.   Follow Up Instructions:    I discussed the assessment and treatment plan with the patient. The patient was provided an  opportunity to ask questions and all were answered. The patient agreed with the plan and demonstrated an understanding of the instructions.   The patient was advised to call back or seek an in-person evaluation if the symptoms worsen or if the condition fails to improve as anticipated.  Follow up in 6 months  [name], MD

## 2019-01-17 ENCOUNTER — Ambulatory Visit (INDEPENDENT_AMBULATORY_CARE_PROVIDER_SITE_OTHER): Payer: Medicare Other | Admitting: Internal Medicine

## 2019-01-17 ENCOUNTER — Encounter: Payer: Self-pay | Admitting: Internal Medicine

## 2019-01-17 DIAGNOSIS — N1831 Chronic kidney disease, stage 3a: Secondary | ICD-10-CM

## 2019-01-17 DIAGNOSIS — F0390 Unspecified dementia without behavioral disturbance: Secondary | ICD-10-CM

## 2019-01-17 DIAGNOSIS — M8949 Other hypertrophic osteoarthropathy, multiple sites: Secondary | ICD-10-CM

## 2019-01-17 DIAGNOSIS — E7849 Other hyperlipidemia: Secondary | ICD-10-CM

## 2019-01-17 DIAGNOSIS — F03B0 Unspecified dementia, moderate, without behavioral disturbance, psychotic disturbance, mood disturbance, and anxiety: Secondary | ICD-10-CM

## 2019-01-17 DIAGNOSIS — K219 Gastro-esophageal reflux disease without esophagitis: Secondary | ICD-10-CM

## 2019-01-17 DIAGNOSIS — G609 Hereditary and idiopathic neuropathy, unspecified: Secondary | ICD-10-CM

## 2019-01-17 DIAGNOSIS — M159 Polyosteoarthritis, unspecified: Secondary | ICD-10-CM

## 2019-01-17 DIAGNOSIS — I10 Essential (primary) hypertension: Secondary | ICD-10-CM | POA: Diagnosis not present

## 2019-01-17 MED ORDER — DOCUSATE SODIUM 100 MG PO CAPS: 100.0000 mg | ORAL_CAPSULE | ORAL | 0 refills | Status: DC

## 2019-01-17 MED ORDER — PREGABALIN 75 MG PO CAPS: 75.0000 mg | ORAL_CAPSULE | Freq: Every day | ORAL | 0 refills | Status: DC

## 2019-01-17 NOTE — Assessment & Plan Note (Signed)
Chronic, stable Arthritis in neck, knees and hands Currently on tramadol typically twice a day, but can take it up to 4 times a day Takes Tylenol as well Overall pain controlled Continue

## 2019-01-17 NOTE — Assessment & Plan Note (Signed)
His daughter stopped the omeprazole and he has been doing fine without it No complaints of GERD or heartburn Continue to monitor off medication

## 2019-01-17 NOTE — Assessment & Plan Note (Signed)
Chronic Has not had blood work done in just over a year secondary to [employer] pandemic Kidney function has always been stable and he is drinking a good amount of fluids and eating well Not taking any NSAIDs Currently on less medication, which is better for his kidneys We will hold off on blood work until after [employer] pandemic has improved I fully hopefully return to office in 6 months-family agrees with plan

## 2019-01-17 NOTE — Assessment & Plan Note (Signed)
Chronic, stable Continue statin Has not had blood work done recently secondary to [employer] pandemic We will do blood work at his next visit-hopefully in 6 months

## 2019-01-17 NOTE — Assessment & Plan Note (Signed)
Memory still poor No longer taking Namenda or Aricept Family thinks he does feel better being on less medication-unsure if memory is worse since stopping medication Continue to monitor off medication

## 2019-01-17 NOTE — Assessment & Plan Note (Signed)
Chronic, stable, controlled Currently taking Lyrica 75 mg once daily Taking tramadol up to every 6 hours if needed-typically taking it twice a day Taking Tylenol twice daily or as needed Pain seems to be well controlled with current regimen Continue

## 2019-01-31 ENCOUNTER — Telehealth: Payer: Self-pay | Admitting: Internal Medicine

## 2019-01-31 NOTE — Telephone Encounter (Signed)
Patient's daughter Shauna Hugh called and would like to speak with Dr. Quay Burow or her CMA about getting a medication to help pt sleep at night. Please return Ms. Diane's call, thanks.

## 2019-01-31 NOTE — Telephone Encounter (Signed)
Prescription of a new medication-requires a phone visit to discuss options and possible side effects

## 2019-01-31 NOTE — Progress Notes (Signed)
Virtual Visit via Video Note  I connected with Matthew [name] on 01/31/19 at 10:15 AM EST by a video enabled telemedicine application and verified that I am speaking with the correct person using two identifiers.   I discussed the limitations of evaluation and management by telemedicine and the availability of in person appointments. The patient expressed understanding and agreed to proceed.  Present for the visit:  Myself, Dr Billey Gosling, Tonna Corner and his daughter Suzi Roots.  The patient is currently at home and I am in the office.  His daughter is the one who provides the history due to his dementia.  No referring provider.    History of Present Illness: This is an acute visit for sleep difficulties.  Patient overall states he feels well and has no concerns.  He states he is sleeping well and eating well.  His daughter is concerned because he is often up to 3 times at night.  She does use a baby monitor and she is concerned about his safety.  He may get up and attempt to shave.  He is having more confusion with some of his activities.  The other day he went to brush his teeth, but he put toothpaste on his razor was about to brush his teeth with it.  Likely his daughter was there to catch him.  They try to never leave him alone, but nighttime is difficult.  She sleeps down the hall, but wants him to be able to get better sleep.  The other day he was sitting on the couch and putting his sock in his wallet and then he tried putting his wallet in a sock and eventually he put his cookie in his wallet.  He has had no more behavior like this.  His daughter's concern is him hurting himself at night.  She does not feel that he has any overall depression or anxiety.   Social History   Socioeconomic History  . Marital status: Married    Spouse name: [name]  . Number of children: 4  . Years of education: 76  . Highest education level: Not on file  Occupational History  . Occupation: Retired   Comment: TEFL teacher  Tobacco Use  . Smoking status: Never Smoker  . Smokeless tobacco: Never Used  Substance and Sexual Activity  . Alcohol use: No    Alcohol/week: 0.0 standard drinks  . Drug use: No  . Sexual activity: Not on file  Other Topics Concern  . Not on file  Social History Narrative   Lives at home with wife.     Social Determinants of Health   Financial Resource Strain:   . Difficulty of Paying Living Expenses: Not on file  Food Insecurity:   . Worried About Charity fundraiser in the Last Year: Not on file  . Ran Out of Food in the Last Year: Not on file  Transportation Needs:   . Lack of Transportation (Medical): Not on file  . Lack of Transportation (Non-Medical): Not on file  Physical Activity:   . Days of Exercise per Week: Not on file  . Minutes of Exercise per Session: Not on file  Stress:   . Feeling of Stress : Not on file  Social Connections:   . Frequency of Communication with Friends and Family: Not on file  . Frequency of Social Gatherings with Friends and Family: Not on file  . Attends Religious Services: Not on file  . Active Member of Clubs or  Organizations: Not on file  . Attends Archivist Meetings: Not on file  . Marital Status: Not on file     Observations/Objective: Appears well in NAD Breathing normally Mood appears normal  Assessment and Plan:  See Problem List for Assessment and Plan of chronic medical problems.   Follow Up Instructions:    I discussed the assessment and treatment plan with the patient. The patient was provided an opportunity to ask questions and all were answered. The patient agreed with the plan and demonstrated an understanding of the instructions.   The patient was advised to call back or seek an in-person evaluation if the symptoms worsen or if the condition fails to improve as anticipated.    [name], MD

## 2019-01-31 NOTE — Telephone Encounter (Signed)
Appointment made

## 2019-01-31 NOTE — Telephone Encounter (Signed)
Do you want to make a virtual appointment?

## 2019-02-01 ENCOUNTER — Encounter: Payer: Self-pay | Admitting: Internal Medicine

## 2019-02-01 ENCOUNTER — Ambulatory Visit (INDEPENDENT_AMBULATORY_CARE_PROVIDER_SITE_OTHER): Payer: Medicare Other | Admitting: Internal Medicine

## 2019-02-01 DIAGNOSIS — G479 Sleep disorder, unspecified: Secondary | ICD-10-CM

## 2019-02-01 MED ORDER — TRAZODONE HCL 50 MG PO TABS: 25.0000 mg | ORAL_TABLET | Freq: Every evening | ORAL | 5 refills | Status: DC | PRN

## 2019-02-01 NOTE — Assessment & Plan Note (Addendum)
New problem He is experiencing sleep difficulties related to his dementia Getting up a few times at night and there is a concern about safety His daughter lives with him and uses a baby monitor, but cannot walk from all the time We will try trazodone 25 mg nightly to see if that helps.  Advised that if it does not work she can increase it to 50 mg at night Discussed possible side effects If this does not work we can consider another medication She will update me via [employer]

## 2019-02-07 ENCOUNTER — Other Ambulatory Visit: Payer: Self-pay | Admitting: Internal Medicine

## 2019-02-08 NOTE — Telephone Encounter (Signed)
Last OV 01/17/19 Next OV NA Last RF 10/31/18

## 2019-02-09 ENCOUNTER — Other Ambulatory Visit: Payer: Self-pay | Admitting: Internal Medicine

## 2019-03-08 ENCOUNTER — Other Ambulatory Visit: Payer: Self-pay | Admitting: Internal Medicine

## 2019-03-08 MED ORDER — ESCITALOPRAM OXALATE 5 MG PO TABS: 5.0000 mg | ORAL_TABLET | Freq: Every day | ORAL | 5 refills | Status: DC

## 2019-03-22 ENCOUNTER — Other Ambulatory Visit: Payer: Self-pay | Admitting: Internal Medicine

## 2019-03-22 MED ORDER — ESCITALOPRAM OXALATE 10 MG PO TABS: 10.0000 mg | ORAL_TABLET | Freq: Every day | ORAL | 1 refills | Status: DC

## 2019-05-02 ENCOUNTER — Other Ambulatory Visit: Payer: Self-pay | Admitting: Internal Medicine

## 2019-05-02 MED ORDER — CLONIDINE HCL 0.1 MG PO TABS: 0.1000 mg | ORAL_TABLET | Freq: Every day | ORAL | 5 refills | Status: DC

## 2019-05-02 MED ORDER — ESCITALOPRAM OXALATE 20 MG PO TABS: 20.0000 mg | ORAL_TABLET | Freq: Every day | ORAL | 1 refills | Status: DC

## 2019-05-19 ENCOUNTER — Other Ambulatory Visit: Payer: Self-pay | Admitting: Internal Medicine

## 2019-05-19 NOTE — Telephone Encounter (Signed)
Done erx 

## 2019-05-24 ENCOUNTER — Encounter: Payer: Self-pay | Admitting: Internal Medicine

## 2019-05-24 NOTE — Telephone Encounter (Signed)
Daughter Levander Campion [phone]) dropped off forms.  She is requesting 1 day a week for intermittent leave.   &We have set up an appointment with Dr.Burns for Monday to discuss and discuss the referral request.

## 2019-05-26 NOTE — Progress Notes (Signed)
Subjective:    Patient ID: Matthew [name], male    DOB: 1930/10/21, 86 y.o.   MRN: [id number]  HPI The patient is here for an acute visit.  His daughter is with him today and provides some of the history.  He has dementia and is not a good historian.  Chest pain:  In the past his cardiac w/u has been negative.  He had a negative myocardial perfusion test in 02/2012 and 2007 he had a cardiac catheterization in 2014 that showed minimal CAD.  He had an Echo 04/2015 - EF 60-65%, grade 1 DD, mild AS.  He does not have it daily.  Not associated with eating.  He has it in the morning when he wakes up, walks into the family room and sits on the couch and complains of it.  He intermittently has had chest pain for the past several years.  His daughter does not think that it is escalating in nature.  His daughter does not think it is associated with ambulating.  She estimates he gets it or complains of it once every 2-3 weeks.  He is unable to tell me how long it lasts.  His daughter thinks taking tramadol resolves the pain quickly.  He does have chronic neck pain.  He is taking all of his medications as prescribed.  His daughter gives him his medications.  Medications and allergies reviewed with patient and updated if appropriate.  Patient Active Problem List   Diagnosis Date Noted  . Sleep difficulties 02/01/2019  . Primary osteoarthritis involving multiple joints 02/15/2018  . Hyperglycemia 11/30/2017  . Chronic neck pain 09/08/2017  . GERD (gastroesophageal reflux disease) 04/01/2017  . Rectal bleeding 04/01/2017  . Chronic headaches 04/01/2017  . Mild aortic stenosis 05/12/2015  . Constipation 05/12/2015  . Left-sided chest pain 04/30/2015  . CKD (chronic kidney disease) stage 3, GFR 30-59 ml/min 04/30/2015  . Pancreatic cyst 02/12/2015  . Hereditary and idiopathic peripheral neuropathy 11/07/2014  . Moderate dementia without behavioral disturbance ([place]) 11/02/2009  . SKIN CANCER, HX OF  11/02/2009  . HEMORRHOIDS, INTERNAL 02/03/2007  . ARTHRITIS 02/03/2007  . SLEEP APNEA 02/03/2007  . Hyperlipidemia 11/13/2006  . PVD 11/13/2006  . PROSTATE CANCER, HX OF 11/13/2006  . COLONIC POLYPS, HYPERPLASTIC 12/24/2003  . HIATAL HERNIA 12/24/2003  . Diverticulosis of large intestine 12/24/2003    Current Outpatient Medications on File Prior to Visit  Medication Sig Dispense Refill  . aspirin EC 81 MG tablet Take 81 mg by mouth daily.    . cloNIDine (CATAPRES) 0.1 MG tablet Take 1 tablet (0.1 mg total) by mouth at bedtime. 30 tablet 5  . escitalopram (LEXAPRO) 20 MG tablet Take 1 tablet (20 mg total) by mouth daily. 90 tablet 1  . lidocaine (XYLOCAINE) 5 % ointment Apply 1 application topically as needed for moderate pain. Prescription faxed to [facility], 12 refills    . mometasone (NASONEX) 50 MCG/ACT nasal spray Place 2 sprays into the nose daily. 17 g 12  . pregabalin (LYRICA) 75 MG capsule TAKE 1 CAPSULE(75 MG) BY MOUTH TWICE DAILY 180 capsule 0  . simvastatin (ZOCOR) 40 MG tablet TAKE 1 TABLET(40 MG) BY MOUTH DAILY AT 6 PM 90 tablet 1  . traMADol (ULTRAM) 50 MG tablet TAKE 1 TABLET(50 MG) BY MOUTH EVERY 6 HOURS AS NEEDED FOR MODERATE PAIN 360 tablet 0   No current facility-administered medications on file prior to visit.    Past Medical History:  Diagnosis Date  .  Alzheimer's dementia ([place])    "don't know what stage but it's not early" (04/30/2015)  . Anxiety   . Arthritis   . CAD (coronary artery disease)    a.  mild non obstructive CAD on cath 02/2011  . Cataract    NOS  . Chronic kidney disease   . Diverticulosis of colon   . Dysphagia    PMH of  . Hemorrhoids, internal   . Hiatal hernia   . History of colon polyps 2005   hyperplastic; Dr [name]  . Hyperlipidemia   . Hypertension   . Impotence of organic origin   . Metabolic syndrome X   . Prostate cancer ([place])    Dr [name]  . PVD (peripheral vascular disease) ([place])   . Schatzki's  ring   . Skin cancer     Past Surgical History:  Procedure Laterality Date  . APPENDECTOMY  1950  . BACK SURGERY    . CERVICAL FUSION    . COLONOSCOPY W/ POLYPECTOMY  2005   Dr [name]  . EUS N/A 01/15/2015   Procedure: UPPER ENDOSCOPIC ULTRASOUND (EUS) RADIAL;  Surgeon: [name], MD;  Location: [facility] ENDOSCOPY;  Service: Endoscopy;  Laterality: N/A;  . LEFT HEART CATHETERIZATION WITH CORONARY ANGIOGRAM N/A 03/02/2012   Procedure: LEFT HEART CATHETERIZATION WITH CORONARY ANGIOGRAM;  Surgeon: [name], MD;  Location: [facility] CATH LAB;  Service: Cardiovascular;  Laterality: N/A;  . [place]; no chemo or radiation  . SHOULDER SURGERY     right  . TONSILLECTOMY      Social History   Socioeconomic History  . Marital status: Married    Spouse name: [name]  . Number of children: 4  . Years of education: 73  . Highest education level: Not on file  Occupational History  . Occupation: Retired    Comment: TEFL teacher  Tobacco Use  . Smoking status: Never Smoker  . Smokeless tobacco: Never Used  Substance and Sexual Activity  . Alcohol use: No    Alcohol/week: 0.0 standard drinks  . Drug use: No  . Sexual activity: Not on file  Other Topics Concern  . Not on file  Social History Narrative   Lives at home with wife.     Social Determinants of Health   Financial Resource Strain:   . Difficulty of Paying Living Expenses:   Food Insecurity:   . Worried About Charity fundraiser in the Last Year:   . Arboriculturist in the Last Year:   Transportation Needs:   . Film/video editor (Medical):   [name] Lack of Transportation (Non-Medical):   Physical Activity:   . Days of Exercise per Week:   . Minutes of Exercise per Session:   Stress:   . Feeling of Stress :   Social Connections:   . Frequency of Communication with Friends and Family:   . Frequency of Social Gatherings with Friends and Family:   . Attends Religious Services:     . Active Member of Clubs or Organizations:   . Attends Archivist Meetings:   [name] Marital Status:     Family History  Problem Relation Age of Onset  . Lung cancer Father        smoker  . Throat cancer Father   . Kidney disease Father        nephrectomy & partial nephrectomy  . Breast cancer Mother   . Dementia Mother   . Heart  attack Mother 63  . Prostate cancer Brother   . Heart disease Brother        x 3 (one brother with CAD in the 2s, another age 32s, third one 46s CABG)  . Diabetes Maternal Grandmother   . Heart attack Maternal Grandmother        in 66s  . Stroke Neg Hx     Review of Systems  Constitutional: Negative for fever.  Respiratory: Negative for cough, shortness of breath and wheezing.   Cardiovascular: Positive for chest pain. Negative for palpitations and leg swelling.  Gastrointestinal:       No gerd  Musculoskeletal: Positive for neck pain.  Neurological: Positive for headaches (occ).       Objective:   Vitals:   05/27/19 1108  BP: 118/62  Pulse: (!) 58  Resp: 16  Temp: 98 F (36.7 C)  SpO2: 98%   BP Readings from Last 3 Encounters:  05/27/19 118/62  08/11/18 124/75  02/15/18 108/60   Wt Readings from Last 3 Encounters:  05/27/19 178 lb (80.7 kg)  02/15/18 205 lb 12.8 oz (93.4 kg)  11/30/17 197 lb (89.4 kg)   Body mass index is 26.29 kg/m.   Physical Exam Constitutional:      General: He is not in acute distress.    Appearance: Normal appearance. He is not ill-appearing.  HENT:     Head: Normocephalic and atraumatic.  Cardiovascular:     Rate and Rhythm: Normal rate and regular rhythm.     Heart sounds: Murmur (2/6 systolic) present.  Pulmonary:     Effort: Pulmonary effort is normal. No respiratory distress.     Breath sounds: Normal breath sounds. No wheezing or rales.  Chest:     Chest wall: No tenderness.  Musculoskeletal:     Right lower leg: No edema.     Left lower leg: No edema.  Skin:    General: Skin  is warm and dry.  Neurological:     Mental Status: He is alert.            Assessment & Plan:    See Problem List for Assessment and Plan of chronic medical problems.    This visit occurred during the SARS-CoV-2 public health emergency.  Safety protocols were in place, including screening questions prior to the visit, additional usage of staff PPE, and extensive cleaning of exam room while observing appropriate contact time as indicated for disinfecting solutions.

## 2019-05-27 ENCOUNTER — Other Ambulatory Visit: Payer: Self-pay

## 2019-05-27 ENCOUNTER — Ambulatory Visit (INDEPENDENT_AMBULATORY_CARE_PROVIDER_SITE_OTHER): Payer: Medicare Other | Admitting: Internal Medicine

## 2019-05-27 ENCOUNTER — Encounter: Payer: Self-pay | Admitting: Internal Medicine

## 2019-05-27 DIAGNOSIS — G609 Hereditary and idiopathic neuropathy, unspecified: Secondary | ICD-10-CM | POA: Diagnosis not present

## 2019-05-27 DIAGNOSIS — F03B0 Unspecified dementia, moderate, without behavioral disturbance, psychotic disturbance, mood disturbance, and anxiety: Secondary | ICD-10-CM

## 2019-05-27 DIAGNOSIS — G479 Sleep disorder, unspecified: Secondary | ICD-10-CM

## 2019-05-27 DIAGNOSIS — F0390 Unspecified dementia without behavioral disturbance: Secondary | ICD-10-CM

## 2019-05-27 DIAGNOSIS — E7849 Other hyperlipidemia: Secondary | ICD-10-CM | POA: Diagnosis not present

## 2019-05-27 DIAGNOSIS — F419 Anxiety disorder, unspecified: Secondary | ICD-10-CM

## 2019-05-27 DIAGNOSIS — R079 Chest pain, unspecified: Secondary | ICD-10-CM

## 2019-05-27 MED ORDER — SIMVASTATIN 40 MG PO TABS: ORAL_TABLET | ORAL | 3 refills | Status: DC

## 2019-05-27 NOTE — Assessment & Plan Note (Signed)
Chronic Has seen neurology in the past Currently not on any medication Memory has slowly declined Occasionally wanders in the middle the night No behavioral disturbances

## 2019-05-27 NOTE — Patient Instructions (Addendum)
Continue to monitor the chest pain -- if it increases we may need to consider a cardiology referral.    Continue current medications.

## 2019-05-27 NOTE — Assessment & Plan Note (Signed)
Acute on chronic For a few years Work-up in the past has been negative Last stress test 2017, cardiac catheterization 2014 which showed minimal CAD  Chest wall nontender to palpation Still sounds somewhat musculoskeletal in nature-possibly related to chronic neck pain Discussed options with his daughter-at this point they will just monitor They will let me know if the pain persists or continues and we can consider cardiology referral

## 2019-05-27 NOTE — Assessment & Plan Note (Signed)
Chronic Controlled, stable Continue current dose of medication lexapro 20 mg

## 2019-05-27 NOTE — Assessment & Plan Note (Signed)
Chronic Stable, controlled Taking Lyrica once daily Takes tramadol only as needed, which is not often Takes Tylenol with the tramadol Overall controlled Continue

## 2019-05-27 NOTE — Assessment & Plan Note (Signed)
Chronic. Continue simvastatin. 

## 2019-05-27 NOTE — Assessment & Plan Note (Signed)
Chronic Taking clonidine nightly-helps a little, but not completely effective-still wakes up in the middle the night and wander some Discussed with his daughter that we can try a different medication if they would like-they will let me know

## 2019-06-20 ENCOUNTER — Encounter: Payer: Self-pay | Admitting: Internal Medicine

## 2019-06-20 MED ORDER — CLONIDINE HCL 0.2 MG PO TABS: 0.2000 mg | ORAL_TABLET | Freq: Every day | ORAL | 1 refills | Status: DC

## 2019-08-12 DIAGNOSIS — Z0279 Encounter for issue of other medical certificate: Secondary | ICD-10-CM

## 2019-08-29 ENCOUNTER — Other Ambulatory Visit: Payer: Self-pay | Admitting: Internal Medicine

## 2019-09-16 ENCOUNTER — Other Ambulatory Visit: Payer: Self-pay | Admitting: Internal Medicine

## 2019-10-02 ENCOUNTER — Encounter: Payer: Self-pay | Admitting: Internal Medicine

## 2019-10-16 DIAGNOSIS — M6281 Muscle weakness (generalized): Secondary | ICD-10-CM | POA: Diagnosis not present

## 2019-10-16 DIAGNOSIS — F0280 Dementia in other diseases classified elsewhere without behavioral disturbance: Secondary | ICD-10-CM | POA: Diagnosis not present

## 2019-10-16 DIAGNOSIS — G9009 Other idiopathic peripheral autonomic neuropathy: Secondary | ICD-10-CM | POA: Diagnosis not present

## 2019-10-16 DIAGNOSIS — N189 Chronic kidney disease, unspecified: Secondary | ICD-10-CM | POA: Diagnosis not present

## 2019-10-21 DIAGNOSIS — F419 Anxiety disorder, unspecified: Secondary | ICD-10-CM | POA: Diagnosis not present

## 2019-10-21 DIAGNOSIS — I739 Peripheral vascular disease, unspecified: Secondary | ICD-10-CM | POA: Diagnosis not present

## 2019-10-21 DIAGNOSIS — E785 Hyperlipidemia, unspecified: Secondary | ICD-10-CM | POA: Diagnosis not present

## 2019-10-21 DIAGNOSIS — G8929 Other chronic pain: Secondary | ICD-10-CM | POA: Diagnosis not present

## 2019-10-21 DIAGNOSIS — N189 Chronic kidney disease, unspecified: Secondary | ICD-10-CM | POA: Diagnosis not present

## 2019-10-21 DIAGNOSIS — F0390 Unspecified dementia without behavioral disturbance: Secondary | ICD-10-CM | POA: Diagnosis not present

## 2019-10-21 DIAGNOSIS — G629 Polyneuropathy, unspecified: Secondary | ICD-10-CM | POA: Diagnosis not present

## 2019-10-21 DIAGNOSIS — M1990 Unspecified osteoarthritis, unspecified site: Secondary | ICD-10-CM | POA: Diagnosis not present

## 2019-10-21 DIAGNOSIS — Z9181 History of falling: Secondary | ICD-10-CM | POA: Diagnosis not present

## 2019-10-23 DIAGNOSIS — F419 Anxiety disorder, unspecified: Secondary | ICD-10-CM | POA: Diagnosis not present

## 2019-10-23 DIAGNOSIS — F0280 Dementia in other diseases classified elsewhere without behavioral disturbance: Secondary | ICD-10-CM | POA: Diagnosis not present

## 2019-11-06 DIAGNOSIS — F0280 Dementia in other diseases classified elsewhere without behavioral disturbance: Secondary | ICD-10-CM | POA: Diagnosis not present

## 2019-11-06 DIAGNOSIS — W19XXXA Unspecified fall, initial encounter: Secondary | ICD-10-CM | POA: Diagnosis not present

## 2019-11-06 DIAGNOSIS — M6281 Muscle weakness (generalized): Secondary | ICD-10-CM | POA: Diagnosis not present

## 2019-12-19 ENCOUNTER — Encounter (HOSPITAL_COMMUNITY): Payer: Self-pay

## 2019-12-19 ENCOUNTER — Emergency Department (HOSPITAL_COMMUNITY): Payer: Medicare Other

## 2019-12-19 ENCOUNTER — Emergency Department (HOSPITAL_COMMUNITY)
Admission: EM | Admit: 2019-12-19 | Discharge: 2019-12-19 | Disposition: A | Discharge status: Home / Self Care | Payer: Medicare Other | Attending: Emergency Medicine | Admitting: Emergency Medicine

## 2019-12-19 ENCOUNTER — Other Ambulatory Visit: Payer: Self-pay

## 2019-12-19 DIAGNOSIS — I129 Hypertensive chronic kidney disease with stage 1 through stage 4 chronic kidney disease, or unspecified chronic kidney disease: Secondary | ICD-10-CM | POA: Diagnosis not present

## 2019-12-19 DIAGNOSIS — Z8546 Personal history of malignant neoplasm of prostate: Secondary | ICD-10-CM | POA: Insufficient documentation

## 2019-12-19 DIAGNOSIS — Z85828 Personal history of other malignant neoplasm of skin: Secondary | ICD-10-CM | POA: Insufficient documentation

## 2019-12-19 DIAGNOSIS — S0101XA Laceration without foreign body of scalp, initial encounter: Secondary | ICD-10-CM | POA: Diagnosis not present

## 2019-12-19 DIAGNOSIS — I2510 Atherosclerotic heart disease of native coronary artery without angina pectoris: Secondary | ICD-10-CM | POA: Diagnosis not present

## 2019-12-19 DIAGNOSIS — Z23 Encounter for immunization: Secondary | ICD-10-CM | POA: Insufficient documentation

## 2019-12-19 DIAGNOSIS — N183 Chronic kidney disease, stage 3 unspecified: Secondary | ICD-10-CM | POA: Diagnosis not present

## 2019-12-19 DIAGNOSIS — Z79899 Other long term (current) drug therapy: Secondary | ICD-10-CM | POA: Insufficient documentation

## 2019-12-19 DIAGNOSIS — W010XXA Fall on same level from slipping, tripping and stumbling without subsequent striking against object, initial encounter: Secondary | ICD-10-CM | POA: Diagnosis not present

## 2019-12-19 DIAGNOSIS — Y9389 Activity, other specified: Secondary | ICD-10-CM | POA: Diagnosis not present

## 2019-12-19 DIAGNOSIS — G309 Alzheimer's disease, unspecified: Secondary | ICD-10-CM | POA: Insufficient documentation

## 2019-12-19 DIAGNOSIS — W19XXXA Unspecified fall, initial encounter: Secondary | ICD-10-CM

## 2019-12-19 DIAGNOSIS — S0191XA Laceration without foreign body of unspecified part of head, initial encounter: Secondary | ICD-10-CM | POA: Diagnosis present

## 2019-12-19 DIAGNOSIS — Z7982 Long term (current) use of aspirin: Secondary | ICD-10-CM | POA: Diagnosis not present

## 2019-12-19 MED ORDER — LIDOCAINE-EPINEPHRINE (PF) 2 %-1:200000 IJ SOLN
10.0000 mL | Freq: Once | INTRAMUSCULAR | Status: AC
  Administered 2019-12-19: 10 mL
  Filled 2019-12-19: qty 20

## 2019-12-19 MED ORDER — ACETAMINOPHEN 325 MG PO TABS
650.0000 mg | ORAL_TABLET | Freq: Once | ORAL | Status: AC
  Administered 2019-12-19: 650 mg via ORAL
  Filled 2019-12-19: qty 2

## 2019-12-19 MED ORDER — TETANUS-DIPHTH-ACELL PERTUSSIS 5-2.5-18.5 LF-MCG/0.5 IM SUSY
0.5000 mL | PREFILLED_SYRINGE | Freq: Once | INTRAMUSCULAR | Status: AC
  Administered 2019-12-19: 0.5 mL via INTRAMUSCULAR
  Filled 2019-12-19: qty 0.5

## 2019-12-19 NOTE — ED Triage Notes (Signed)
Pt arrived via [facility] from [facility]. Pt has a hx of dementia. Pt swung at a staff member, lost his balance, and fell. No LOC. Pt has a lac to the left forehead. Pt usually takes aspirin. Pt only complains of a headache, no neck pain.   Vitals BP: 112/66 HR: 51 99% RA CBG: 127 Temp: 97.8 RR: 16

## 2019-12-19 NOTE — ED Provider Notes (Signed)
[facility] DEPT Provider Note   CSN: [id number] Arrival date & time: 12/19/19  7628     History Chief Complaint  Patient presents with  . Fall    Matthew [name] is a 86 y.o. male.  Pt presents to the ED today with a fall and lac to head.  Pt has a hx of dementia and swung at a staff member.  He lost his balance and fell.  He did not have a loc.  No blood thinners.  He denies any pain other than a headache.        Past Medical History:  Diagnosis Date  . Alzheimer's dementia ([place])    "don't know what stage but it's not early" (04/30/2015)  . Anxiety   . Arthritis   . CAD (coronary artery disease)    a.  mild non obstructive CAD on cath 02/2011  . Cataract    NOS  . Chronic kidney disease   . Diverticulosis of colon   . Dysphagia    PMH of  . Hemorrhoids, internal   . Hiatal hernia   . History of colon polyps 2005   hyperplastic; Dr [name]  . Hyperlipidemia   . Hypertension   . Impotence of organic origin   . Metabolic syndrome X   . Prostate cancer ([place])    Dr [name]  . PVD (peripheral vascular disease) ([place])   . Schatzki's ring   . Skin cancer     Patient Active Problem List   Diagnosis Date Noted  . Sleep difficulties 02/01/2019  . Primary osteoarthritis involving multiple joints 02/15/2018  . Hyperglycemia 11/30/2017  . Chronic neck pain 09/08/2017  . GERD (gastroesophageal reflux disease) 04/01/2017  . Rectal bleeding 04/01/2017  . Chronic headaches 04/01/2017  . Mild aortic stenosis 05/12/2015  . Constipation 05/12/2015  . Left-sided chest pain 04/30/2015  . CKD (chronic kidney disease) stage 3, GFR 30-59 ml/min (H[facility]) 04/30/2015  . Pancreatic cyst 02/12/2015  . Hereditary and idiopathic peripheral neuropathy 11/07/2014  . Moderate dementia without behavioral disturbance ([place]) 11/02/2009  . SKIN CANCER, HX OF 11/02/2009  . Anxiety 02/03/2007  . HEMORRHOIDS, INTERNAL 02/03/2007  . ARTHRITIS 02/03/2007  . SLEEP  APNEA 02/03/2007  . Hyperlipidemia 11/13/2006  . PVD 11/13/2006  . PROSTATE CANCER, HX OF 11/13/2006  . COLONIC POLYPS, HYPERPLASTIC 12/24/2003  . HIATAL HERNIA 12/24/2003  . Diverticulosis of large intestine 12/24/2003    Past Surgical History:  Procedure Laterality Date  . APPENDECTOMY  1950  . BACK SURGERY    . CERVICAL FUSION    . COLONOSCOPY W/ POLYPECTOMY  2005   Dr [name]  . EUS N/A 01/15/2015   Procedure: UPPER ENDOSCOPIC ULTRASOUND (EUS) RADIAL;  Surgeon: [name], MD;  Location: [facility] ENDOSCOPY;  Service: Endoscopy;  Laterality: N/A;  . LEFT HEART CATHETERIZATION WITH CORONARY ANGIOGRAM N/A 03/02/2012   Procedure: LEFT HEART CATHETERIZATION WITH CORONARY ANGIOGRAM;  Surgeon: [name], MD;  Location[facility] CATH LAB;  Service: Cardiovascular;  Laterality: N/A;  . [place]; no chemo or radiation  . SHOULDER SURGERY     right  . TONSILLECTOMY         Family History  Problem Relation Age of Onset  . Lung cancer Father        smoker  . Throat cancer Father   . Kidney disease Father        nephrectomy & partial nephrectomy  . Breast cancer Mother   . Dementia  Mother   . Heart attack Mother 48  . Prostate cancer Brother   . Heart disease Brother        x 3 (one brother with CAD in the 85s, another age 42s, third one 57s CABG)  . Diabetes Maternal Grandmother   . Heart attack Maternal Grandmother        in 51s  . Stroke Neg Hx     Social History   Tobacco Use  . Smoking status: Never Smoker  . Smokeless tobacco: Never Used  Vaping Use  . Vaping Use: Never used  Substance Use Topics  . Alcohol use: No    Alcohol/week: 0.0 standard drinks  . Drug use: No    Home Medications Prior to Admission medications   Medication Sig Start Date End Date Taking? Authorizing Provider  aspirin EC 81 MG tablet Take 81 mg by mouth daily.    [provider]  cloNIDine (CATAPRES) 0.2 MG tablet TAKE 1 TABLET(0.2 MG) BY MOUTH AT  BEDTIME 09/17/19   Burns, Claudina Lick, MD  escitalopram (LEXAPRO) 20 MG tablet TAKE 1 TABLET(20 MG) BY MOUTH DAILY 09/17/19   Burns, Claudina Lick, MD  lidocaine (XYLOCAINE) 5 % ointment Apply 1 application topically as needed for moderate pain. Prescription faxed to [facility], 12 refills 02/19/15   [provider]  mometasone (NASONEX) 50 MCG/ACT nasal spray Place 2 sprays into the nose daily. 08/12/17   Tasia Catchings, Amy V, PA-C  pregabalin (LYRICA) 75 MG capsule TAKE 1 CAPSULE(75 MG) BY MOUTH TWICE DAILY 05/19/19   Biagio Borg, MD  simvastatin (ZOCOR) 40 MG tablet TAKE 1 TABLET(40 MG) BY MOUTH DAILY AT 6 PM 05/27/19   Burns, Claudina Lick, MD  traMADol (ULTRAM) 50 MG tablet Take 1 tablet (50 mg total) by mouth every 8 (eight) hours as needed. 08/29/19   [name], MD    Allergies    Patient has no known allergies.  Review of Systems   Review of Systems  Skin: Positive for wound.  Neurological: Positive for headaches.  All other systems reviewed and are negative.   Physical Exam Updated Vital Signs BP (!) 116/58   Pulse 62   Temp 98.3 F (36.8 C) (Axillary)   Resp 16   SpO2 99%   Physical Exam Vitals and nursing note reviewed.  HENT:     Head: Normocephalic.      Comments: Lac to posterior head    Right Ear: External ear normal.     Left Ear: External ear normal.     Nose: Nose normal.     Mouth/Throat:     Mouth: Mucous membranes are moist.     Pharynx: Oropharynx is clear.  Eyes:     Extraocular Movements: Extraocular movements intact.     Conjunctiva/sclera: Conjunctivae normal.     Pupils: Pupils are equal, round, and reactive to light.  Cardiovascular:     Rate and Rhythm: Normal rate and regular rhythm.     Pulses: Normal pulses.     Heart sounds: Normal heart sounds.  Pulmonary:     Effort: Pulmonary effort is normal.     Breath sounds: Normal breath sounds.  Abdominal:     General: Abdomen is flat. Bowel sounds are normal.     Palpations: Abdomen is soft.   Musculoskeletal:        General: Normal range of motion.     Cervical back: Normal range of motion and neck supple.  Skin:  General: Skin is warm.     Capillary Refill: Capillary refill takes less than 2 seconds.  Neurological:     General: No focal deficit present.     Mental Status: He is alert. Mental status is at baseline.  Psychiatric:        Mood and Affect: Mood normal.        Behavior: Behavior normal.     ED Results / Procedures / Treatments   Labs (all labs ordered are listed, but only abnormal results are displayed) Labs Reviewed - No data to display  EKG None  Radiology DG Chest 2 View  Result Date: 12/19/2019 CLINICAL DATA:  Fall EXAM: CHEST - 2 VIEW COMPARISON:  2017 FINDINGS: Stable elevation of the right hemidiaphragm. No consolidation or edema. No pleural effusion or pneumothorax. Stable cardiomediastinal contours with normal heart size. Calcified plaque along the aortic arch. Included osseous structures are intact. IMPRESSION: No acute process in the chest. Electronically Signed   By: Macy Mis M.D.   On: 12/19/2019 08:48   DG Pelvis 1-2 Views  Result Date: 12/19/2019 CLINICAL DATA:  Fall EXAM: PELVIS - 1-2 VIEW COMPARISON:  None. FINDINGS: There is no evidence of pelvic fracture or diastasis. No pelvic bone lesions are seen. Surgical clips in the pelvis bilaterally. IMPRESSION: Negative. Electronically Signed   By: Franchot Gallo M.D.   On: 12/19/2019 08:51   CT Head Wo Contrast  Result Date: 12/19/2019 CLINICAL DATA:  Fall EXAM: CT HEAD WITHOUT CONTRAST TECHNIQUE: Contiguous axial images were obtained from the base of the skull through the vertex without intravenous contrast. COMPARISON:  None. FINDINGS: Brain: There is no acute intracranial hemorrhage, mass effect, or edema. Gray-white differentiation is preserved. There is no extra-axial fluid collection. Prominence of the ventricles and sulci reflects mild generalized parenchymal volume loss. Patchy  hypoattenuation in the supratentorial white matter is nonspecific but probably reflects mild chronic microvascular ischemic changes. Vascular: There is atherosclerotic calcification at the skull base. Skull: Calvarium is unremarkable. Sinuses/Orbits: Paranasal sinus mucosal thickening. No significant orbital finding. Other: Left scalp laceration. IMPRESSION: No evidence of acute intracranial injury. Chronic/nonemergent findings detailed above. Electronically Signed   By: Macy Mis M.D.   On: 12/19/2019 08:54   CT Cervical Spine Wo Contrast  Result Date: 12/19/2019 CLINICAL DATA:  Fall EXAM: CT CERVICAL SPINE WITHOUT CONTRAST TECHNIQUE: Multidetector CT imaging of the cervical spine was performed without intravenous contrast. Multiplanar CT image reconstructions were also generated. COMPARISON:  None. FINDINGS: Alignment: No significant listhesis. Skull base and vertebrae: No acute cervical spine fracture. There is fusion of the C4-C6 vertebral bodies. Soft tissues and spinal canal: No prevertebral fluid or swelling. No visible canal hematoma. Disc levels: Multilevel degenerative changes are present including disc space narrowing, endplate osteophytes, and facet and uncovertebral hypertrophy. There is marked canal stenosis at C3-C4. Upper chest: No apical lung mass. Other: None. IMPRESSION: No acute cervical spine fracture. Electronically Signed   By: Macy Mis M.D.   On: 12/19/2019 08:59    Procedures .Marland KitchenLaceration Repair  Date/Time: 12/19/2019 7:26 AM Performed by: Isla Pence, MD Authorized by: Isla Pence, MD   Consent:    Consent obtained:  Emergent situation Universal protocol:    Patient identity confirmed:  Arm band Anesthesia:    Anesthesia method:  Local infiltration   Local anesthetic:  Lidocaine 2% WITH epi Laceration details:    Location:  Scalp   Scalp location:  L temporal   Length (cm):  8 Exploration:    Hemostasis  achieved with:  Epinephrine   Contaminated:  no   Treatment:    Area cleansed with:  Povidone-iodine   Amount of cleaning:  Standard   Irrigation solution:  Sterile water   Debridement:  None Skin repair:    Repair method:  Staples   Number of staples:  7 Approximation:    Approximation:  Close Repair type:    Repair type:  Simple Post-procedure details:    Dressing:  Non-adherent dressing   Procedure completion:  Tolerated   (including critical care time)  Medications Ordered in ED Medications  acetaminophen (TYLENOL) tablet 650 mg (has no administration in time range)  lidocaine-EPINEPHrine (XYLOCAINE W/EPI) 2 %-1:200000 (PF) injection 10 mL (10 mLs Infiltration Given by Other 12/19/19 0731)  Tdap (BOOSTRIX) injection 0.5 mL (0.5 mLs Intramuscular Given 12/19/19 8016)    ED Course  I have reviewed the triage vital signs and the nursing notes.  Pertinent labs & imaging results that were available during my care of the patient were reviewed by me and considered in my medical decision making (see chart for details).    MDM Rules/Calculators/A&P                          CTs and Xrays are negative.  Pt is able to ambulate.  He is stable for d/c.  His daughter will take him back to the facility.  Sutures to be removed in 7 to 10 dys.  Return if worse.  Final Clinical Impression(s) / ED Diagnoses Final diagnoses:  Laceration of scalp, initial encounter  Fall, initial encounter    Rx / DC Orders ED Discharge Orders    None       Isla Pence, MD 12/19/19 [phone]

## 2019-12-19 NOTE — ED Notes (Signed)
Pt discharged from this ED in stable condition at this time. All discharge instructions and follow up care reviewed with pt daughter with no further questions at this time. Pt ambulatory with steady gait, clear speech.

## 2019-12-19 NOTE — Discharge Instructions (Signed)
Sutures to be removed in 7 to 10 days.

## 2020-03-27 ENCOUNTER — Telehealth: Payer: Self-pay | Admitting: Internal Medicine

## 2020-03-27 NOTE — Progress Notes (Signed)
  Chronic Care Management   Outreach Note  03/27/2020 Name: HORACIO WERTH MRN: [id number] DOB: 1930/09/05  Referred by: [name], MD Reason for referral : No chief complaint on file.   An unsuccessful telephone outreach was attempted today. The patient was referred to the pharmacist for assistance with care management and care coordination.   Follow Up Plan:   Carley Perdue [employer] Scheduler

## 2020-04-14 ENCOUNTER — Telehealth: Payer: Self-pay | Admitting: Internal Medicine

## 2020-04-14 NOTE — Progress Notes (Signed)
  Chronic Care Management   Outreach Note  04/14/2020 Name: Matthew [name] MRN: [id number] DOB: 1930/10/07  Referred by: [name], MD Reason for referral : No chief complaint on file.   A second unsuccessful telephone outreach was attempted today. The patient was referred to pharmacist for assistance with care management and care coordination.  Follow Up Plan:   Carley Perdue [employer] Scheduler

## 2020-06-12 ENCOUNTER — Other Ambulatory Visit: Payer: Self-pay | Admitting: Internal Medicine

## 2020-07-21 ENCOUNTER — Telehealth: Payer: Self-pay | Admitting: Internal Medicine

## 2020-07-21 NOTE — Chronic Care Management (AMB) (Signed)
  Chronic Care Management   Outreach Note  07/21/2020 Name: DANEN LAPAGLIA MRN: [id number] DOB: October 05, 1930  Referred by: [name], MD Reason for referral : No chief complaint on file.   Third unsuccessful telephone outreach was attempted today. The patient was referred to the pharmacist for assistance with care management and care coordination.   Follow Up Plan:   Lauretta Grill [employer] Scheduler

## 2020-07-29 ENCOUNTER — Telehealth: Payer: Self-pay | Admitting: Internal Medicine

## 2020-07-29 NOTE — Telephone Encounter (Signed)
LVM for pt to rtn my call to schedule AWV with [facility]. Please schedule this appt if pt comes to the office or calls.

## 2020-09-16 ENCOUNTER — Telehealth: Payer: Self-pay | Admitting: Internal Medicine

## 2020-09-16 NOTE — Telephone Encounter (Signed)
LVM for pt to rtn my call to schedule AWV with [facility]. Please schedule this appt if pt calls the office.  °

## 2021-12-20 ENCOUNTER — Emergency Department (HOSPITAL_COMMUNITY)
Admission: EM | Admit: 2021-12-20 | Discharge: 2021-12-20 | Disposition: A | Discharge status: Skilled Nursing Facility | Payer: Medicare Other | Attending: Emergency Medicine | Admitting: Emergency Medicine

## 2021-12-20 ENCOUNTER — Emergency Department (HOSPITAL_COMMUNITY): Payer: Medicare Other

## 2021-12-20 DIAGNOSIS — Y92129 Unspecified place in nursing home as the place of occurrence of the external cause: Secondary | ICD-10-CM | POA: Diagnosis not present

## 2021-12-20 DIAGNOSIS — W19XXXA Unspecified fall, initial encounter: Secondary | ICD-10-CM | POA: Insufficient documentation

## 2021-12-20 DIAGNOSIS — S0990XA Unspecified injury of head, initial encounter: Secondary | ICD-10-CM | POA: Diagnosis present

## 2021-12-20 DIAGNOSIS — T148XXA Other injury of unspecified body region, initial encounter: Secondary | ICD-10-CM

## 2021-12-20 DIAGNOSIS — S0003XA Contusion of scalp, initial encounter: Secondary | ICD-10-CM | POA: Diagnosis not present

## 2021-12-20 DIAGNOSIS — Z7982 Long term (current) use of aspirin: Secondary | ICD-10-CM | POA: Insufficient documentation

## 2021-12-20 DIAGNOSIS — S50319A Abrasion of unspecified elbow, initial encounter: Secondary | ICD-10-CM | POA: Diagnosis not present

## 2021-12-20 MED ORDER — HALOPERIDOL LACTATE 5 MG/ML IJ SOLN: 5.0000 mg | Freq: Once | INTRAMUSCULAR | Status: DC

## 2021-12-20 NOTE — ED Notes (Signed)
THis RN entered the room to meet pt and found him standing at the end of the bed tangled up in wires.  He was semi-redirectable but required some physical coercion to return to bed.  I decided to place him in room closer to desk and then his daughter showed up and has been helpful in settling him.  I am not sure how well he will be still for a scan or what he will do if daughter leaves.

## 2021-12-20 NOTE — ED Notes (Incomplete)
Family declined CT scan and is ready to take him home.

## 2021-12-20 NOTE — ED Notes (Signed)
Patient's family declined to have CT scan done. MD Kathrynn Humble made aware. Pt's family is ready to take him home.

## 2021-12-20 NOTE — Discharge Instructions (Addendum)
We saw you in the ER after you had a fall.  Family has declined CT scan of the brain, C-spine.  Matthew [name] was observed in the ER for 4 hours, there is no change in his mental status.  He has some wounds that will need close attention to ensure they heal properly.  There are simple skin abrasions, you may apply Neosporin to those wounds.

## 2021-12-20 NOTE — ED Triage Notes (Signed)
Pt BIB EMS from [facility], for unwitnessed fall. Unsure of LOC. Abrasion to top of scalp. Recently on antibiotics for UTI. Alert to himself at baseline, hx of dementia.

## 2021-12-23 ENCOUNTER — Telehealth: Payer: Self-pay

## 2021-12-23 NOTE — Telephone Encounter (Signed)
Patient was transferred to SNF so no TCM call done at this time.  He has also not seen Dr .Quay Burow since 05/17/19 for an acute visit and she is no longer listed as his PCP currently.

## 2021-12-25 NOTE — ED Provider Notes (Signed)
[facility] EMERGENCY DEPARTMENT Provider Note   CSN: [id number] Arrival date & time: 12/20/21  1732     History  Chief Complaint  Patient presents with   Lytle Michaels    Matthew [name] is a 88 y.o. male.  HPI    88 y/o M comes in to the ER with cc of fall. Pt resides at a SNF and has hx of ckd. Daughter came to the bedside later on.  Pt is alert to self. States not sure how he fell. Has injury to elbow and scalp. Not on thinners.   Home Medications Prior to Admission medications   Medication Sig Start Date End Date Taking? Authorizing Provider  acetaminophen (TYLENOL) 500 MG tablet Take 250 mg by mouth 2 (two) times daily. (0900, 1600)   Yes [provider]  aspirin EC 81 MG tablet Take 81 mg by mouth in the morning. (0900)   Yes [provider]  cloNIDine (CATAPRES) 0.2 MG tablet TAKE 1 TABLET(0.2 MG) BY MOUTH AT BEDTIME Patient taking differently: Take 0.2 mg by mouth at bedtime. (2000) 09/17/19  Yes Burns, Claudina Lick, MD  Emollient (AQUAPHOR ADVANCED THERAPY) 41 % OINT Apply 1 application  topically at bedtime. (2000) to bilateral lower legs and feet   Yes [provider]  escitalopram (LEXAPRO) 20 MG tablet TAKE 1 TABLET(20 MG) BY MOUTH DAILY Patient taking differently: Take 20 mg by mouth in the morning. (0900) 09/17/19  Yes Burns, Claudina Lick, MD  LORazepam (ATIVAN) 1 MG tablet Take 1 mg by mouth 2 (two) times daily as needed for anxiety (agitation).   Yes [provider]  melatonin 5 MG TABS Take 5 mg by mouth at bedtime. (2000)   Yes [provider]  polyethylene glycol powder (GLYCOLAX/MIRALAX) 17 GM/SCOOP powder Take 17 g by mouth in the morning. (0900)   Yes [provider]  pregabalin (LYRICA) 75 MG capsule TAKE 1 CAPSULE(75 MG) BY MOUTH TWICE DAILY Patient taking differently: Take 75 mg by mouth 2 (two) times daily. (0900, 1600) 05/19/19  Yes Biagio Borg, MD  simvastatin (ZOCOR) 40 MG tablet TAKE 1 TABLET(40  MG) BY MOUTH DAILY AT 6 PM Patient taking differently: Take 40 mg by mouth at bedtime. (2000) 06/12/20  Yes Burns, Claudina Lick, MD  traMADol (ULTRAM) 50 MG tablet Take 1 tablet (50 mg total) by mouth every 8 (eight) hours as needed. Patient taking differently: Take 50 mg by mouth 2 (two) times daily as needed (pain). 08/29/19  Yes Burns, Claudina Lick, MD  ciprofloxacin (CIPRO) 500 MG tablet Take 500 mg by mouth 2 (two) times daily. 7 day course Patient not taking: Reported on 12/20/2021    [provider]      Allergies    Patient has no known allergies.    Review of Systems   Review of Systems  Physical Exam Updated Vital Signs BP (!) 160/90 (BP Location: Right Arm)   Pulse (!) 39   Temp (!) 97 F (36.1 C) (Axillary)   Resp 19   Ht '5\' 9"'$  (1.753 m)   Wt 80.7 kg   SpO2 99%   BMI 26.27 kg/m  Physical Exam Vitals and nursing note reviewed.  Constitutional:      Appearance: He is well-developed.  HENT:     Head: Atraumatic.  Neck:     Comments: In c-collar Cardiovascular:     Rate and Rhythm: Normal rate.  Pulmonary:     Effort: Pulmonary effort is normal.  Musculoskeletal:        General: Signs of injury present. No deformity.  Skin:    General: Skin is warm.     Findings: Bruising present.  Neurological:     Mental Status: He is alert. Mental status is at baseline.     ED Results / Procedures / Treatments   Labs (all labs ordered are listed, but only abnormal results are displayed) Labs Reviewed - No data to display  EKG None  Radiology No results found.  Procedures Procedures    Medications Ordered in ED Medications - No data to display  ED Course/ Medical Decision Making/ A&P                           Medical Decision Making 88 y/o M with hx of CKD, dementia comes in with cc of fall and pt has scalp abrasion, elbow abrasion. At baseline mental status. No gross deformity.  DDx includes: - Mechanical falls - ICH - Fractures - Contusions - Soft  tissue injury  Daughter at bedside. Daughter provides meaningful history. She states that currently pt is baseline. Ultimately, she made [place] aware that she doesn't think a CT is needed.   She understands that her actions will lead to inadequate medical workup, and that the patient is at risk of complications of missed diagnosis, which includes morbidity and mortality such as subdural hematoma.  Opportunity to change mind given. Discussion witnessed by primary RN. She is comfortable taking her father to SNF provided we can assist her.  Strict ER return precautions have been discussed, and patient is agreeing with the plan and is comfortable with the workup done and the recommendations from the ER.       Final Clinical Impression(s) / ED Diagnoses Final diagnoses:  Fall, initial encounter  Contusion of scalp, initial encounter  Abrasion    Rx / DC Orders ED Discharge Orders     None         Varney Biles, MD 12/25/21 2244

## 2022-03-01 ENCOUNTER — Emergency Department (HOSPITAL_COMMUNITY): Payer: Medicare Other

## 2022-03-01 ENCOUNTER — Other Ambulatory Visit: Payer: Self-pay

## 2022-03-01 ENCOUNTER — Emergency Department (HOSPITAL_COMMUNITY)
Admission: EM | Admit: 2022-03-01 | Discharge: 2022-03-01 | Disposition: A | Discharge status: Skilled Nursing Facility | Payer: Medicare Other | Attending: Emergency Medicine | Admitting: Emergency Medicine

## 2022-03-01 ENCOUNTER — Encounter (HOSPITAL_COMMUNITY): Payer: Self-pay

## 2022-03-01 DIAGNOSIS — F0390 Unspecified dementia without behavioral disturbance: Secondary | ICD-10-CM | POA: Diagnosis not present

## 2022-03-01 DIAGNOSIS — W01198A Fall on same level from slipping, tripping and stumbling with subsequent striking against other object, initial encounter: Secondary | ICD-10-CM | POA: Insufficient documentation

## 2022-03-01 DIAGNOSIS — S40021A Contusion of right upper arm, initial encounter: Secondary | ICD-10-CM | POA: Insufficient documentation

## 2022-03-01 DIAGNOSIS — S0990XA Unspecified injury of head, initial encounter: Secondary | ICD-10-CM | POA: Diagnosis present

## 2022-03-01 DIAGNOSIS — S0093XA Contusion of unspecified part of head, initial encounter: Secondary | ICD-10-CM | POA: Diagnosis not present

## 2022-03-01 DIAGNOSIS — Z7982 Long term (current) use of aspirin: Secondary | ICD-10-CM | POA: Insufficient documentation

## 2022-03-01 NOTE — ED Provider Notes (Addendum)
[place] [facility] Provider Note   CSN: [id number] Arrival date & time: 03/01/22  1025     History  Chief Complaint  Patient presents with   Head Injury    Matthew [name] is a 89 y.o. male.  Patient has a history of dementia.  He fell and hit his head and right elbow  The history is provided by the nursing home. No language interpreter was used.  Fall This is a new problem. The current episode started 12 to 24 hours ago. The problem occurs rarely. The problem has been resolved. Pertinent negatives include no chest pain, no abdominal pain and no headaches. Nothing aggravates the symptoms. Nothing relieves the symptoms. He has tried nothing for the symptoms. The treatment provided no relief.       Home Medications Prior to Admission medications   Medication Sig Start Date End Date Taking? Authorizing Provider  acetaminophen (TYLENOL) 500 MG tablet Take 250 mg by mouth 2 (two) times daily.   Yes [provider]  aspirin EC 81 MG tablet Take 81 mg by mouth in the morning.   Yes [provider]  cloNIDine (CATAPRES) 0.2 MG tablet TAKE 1 TABLET(0.2 MG) BY MOUTH AT BEDTIME Patient taking differently: Take 0.2 mg by mouth at bedtime. 09/17/19  Yes Burns, Claudina Lick, MD  docusate sodium (COLACE) 100 MG capsule Take 100 mg by mouth daily.   Yes [provider]  Emollient (AQUAPHOR ADVANCED THERAPY) 41 % OINT Apply 1 application  topically at bedtime. To bilateral lower legs and feet   Yes [provider]  escitalopram (LEXAPRO) 20 MG tablet TAKE 1 TABLET(20 MG) BY MOUTH DAILY Patient taking differently: Take 20 mg by mouth in the morning. 09/17/19  Yes Burns, Claudina Lick, MD  LORazepam (ATIVAN) 1 MG tablet Take 1 mg by mouth 2 (two) times daily as needed for anxiety (agitation).   Yes [provider]  melatonin 5 MG TABS Take 5 mg by mouth at bedtime.   Yes [provider]  polyethylene glycol powder  (GLYCOLAX/MIRALAX) 17 GM/SCOOP powder Take 17 g by mouth in the morning.   Yes [provider]  pregabalin (LYRICA) 75 MG capsule TAKE 1 CAPSULE(75 MG) BY MOUTH TWICE DAILY Patient taking differently: Take 75 mg by mouth 2 (two) times daily. 05/19/19  Yes Biagio Borg, MD  simvastatin (ZOCOR) 40 MG tablet TAKE 1 TABLET(40 MG) BY MOUTH DAILY AT 6 PM Patient taking differently: Take 40 mg by mouth at bedtime. 06/12/20  Yes Burns, Claudina Lick, MD  traMADol (ULTRAM) 50 MG tablet Take 1 tablet (50 mg total) by mouth every 8 (eight) hours as needed. Patient taking differently: Take 50 mg by mouth 2 (two) times daily as needed (Pain). 08/29/19  Yes [name], MD      Allergies    Patient has no known allergies.    Review of Systems   Review of Systems  Constitutional:  Negative for appetite change and fatigue.  HENT:  Negative for congestion, ear discharge and sinus pressure.        Head injury  Eyes:  Negative for discharge.  Respiratory:  Negative for cough.   Cardiovascular:  Negative for chest pain.  Gastrointestinal:  Negative for abdominal pain and diarrhea.  Genitourinary:  Negative for frequency and hematuria.  Musculoskeletal:  Negative for back pain.  Skin:  Negative for rash.  Neurological:  Negative for seizures and headaches.  Psychiatric/Behavioral:  Negative for  hallucinations.     Physical Exam Updated Vital Signs BP (!) 162/71   Pulse (!) 46   Temp (!) 96.6 F (35.9 C) (Axillary)   Resp 12   Ht 5' 9"$  (1.753 m)   Wt 80 kg   SpO2 100%   BMI 26.05 kg/m  Physical Exam Vitals and nursing note reviewed.  Constitutional:      Appearance: He is well-developed.  HENT:     Head: Normocephalic.     Comments: Contusion to head    Nose: Nose normal.  Eyes:     General: No scleral icterus.    Conjunctiva/sclera: Conjunctivae normal.  Neck:     Thyroid: No thyromegaly.  Cardiovascular:     Rate and Rhythm: Normal rate and regular rhythm.     Heart sounds: No  murmur heard.    No friction rub. No gallop.  Pulmonary:     Breath sounds: No stridor. No wheezing or rales.  Chest:     Chest wall: No tenderness.  Abdominal:     General: There is no distension.     Tenderness: There is no abdominal tenderness. There is no rebound.  Musculoskeletal:     Cervical back: Neck supple.     Comments: Contusion to right arm  Lymphadenopathy:     Cervical: No cervical adenopathy.  Skin:    Findings: No erythema or rash.  Neurological:     Mental Status: He is alert and oriented to person, place, and time.     Motor: No abnormal muscle tone.     Coordination: Coordination normal.  Psychiatric:        Behavior: Behavior normal.     ED Results / Procedures / Treatments   Labs (all labs ordered are listed, but only abnormal results are displayed) Labs Reviewed - No data to display  EKG None  Radiology CT Cervical Spine Wo Contrast  Result Date: 03/01/2022 CLINICAL DATA:  Neck trauma. EXAM: CT CERVICAL SPINE WITHOUT CONTRAST TECHNIQUE: Multidetector CT imaging of the cervical spine was performed without intravenous contrast. Multiplanar CT image reconstructions were also generated. RADIATION DOSE REDUCTION: This exam was performed according to the departmental dose-optimization program which includes automated exposure control, adjustment of the mA and/or kV according to patient size and/or use of iterative reconstruction technique. COMPARISON:  12/19/2019 FINDINGS: Alignment: Normal. Skull base and vertebrae: No acute fracture. No primary bone lesion or focal pathologic process. Soft tissues and spinal canal: No prevertebral fluid or swelling. No visible canal hematoma. Disc levels: Disc space narrowing and anterior fusion C4-6. Osteoarthritis C1-C2. Facet joint degenerative changes from C2-3 through C7-T1. Upper chest: Negative. IMPRESSION: Extensive degenerative changes. No acute traumatic abnormality identified. Electronically Signed   By: Sammie Bench M.D.   On: 03/01/2022 12:57   CT Head Wo Contrast  Result Date: 03/01/2022 CLINICAL DATA:  Head trauma. EXAM: CT HEAD WITHOUT CONTRAST TECHNIQUE: Contiguous axial images were obtained from the base of the skull through the vertex without intravenous contrast. RADIATION DOSE REDUCTION: This exam was performed according to the departmental dose-optimization program which includes automated exposure control, adjustment of the mA and/or kV according to patient size and/or use of iterative reconstruction technique. COMPARISON:  12/19/2019 FINDINGS: Brain: There is periventricular white matter decreased attenuation consistent with small vessel ischemic changes. Ventricles, sulci and cisterns are prominent consistent with age related involutional changes. No acute intracranial hemorrhage, mass effect or shift. No hydrocephalus. There is cavum septum pellucidum and cavum vergae. Vascular: No hyperdense vessel or  unexpected calcification. Skull: No fracture identified. There is right frontal scalp hyperdensity with soft tissue swelling consistent with cephalohematoma. Sinuses/Orbits: No acute finding. IMPRESSION: Atrophy and chronic small vessel ischemic changes. Right frontal cephalohematoma. No acute intracranial process identified. Electronically Signed   By: Sammie Bench M.D.   On: 03/01/2022 12:53   DG Pelvis 1-2 Views  Result Date: 03/01/2022 CLINICAL DATA:  Fall. EXAM: PELVIS - 1-2 VIEW COMPARISON:  Pelvis x-ray dated December 19, 2019. FINDINGS: There is no evidence of pelvic fracture or diastasis. No pelvic bone lesions are seen. IMPRESSION: Negative. Electronically Signed   By: Titus Dubin M.D.   On: 03/01/2022 11:12   DG Elbow Complete Right  Result Date: 03/01/2022 CLINICAL DATA:  Fall.  Weakness EXAM: RIGHT ELBOW - COMPLETE 4 VIEW COMPARISON:  None Available. FINDINGS: There is no evidence of fracture, dislocation, or joint effusion. There is no evidence of arthropathy or other focal  bone abnormality. Soft tissues are unremarkable. Small well corticated density seen along the lateral epicondyle and along the tip of the olecranon, possible accessory ossicles or sequela of old trauma. IMPRESSION: No acute osseous abnormality Electronically Signed   By: Jill Side M.D.   On: 03/01/2022 11:11    Procedures Procedures    Medications Ordered in ED Medications - No data to display  ED Course/ Medical Decision Making/ A&P                             Medical Decision Making Amount and/or Complexity of Data Reviewed Radiology: ordered.  This patient presents to the ED for concern of fall, this involves an extensive number of treatment options, and is a complaint that carries with it a high risk of complications and morbidity.  The differential diagnosis includes head injury   Co morbidities that complicate the patient evaluation  Dementia   Additional history obtained:  Additional history obtained from nursing home External records from outside source obtained and reviewed including hospital records   Lab Tests:  No labs  Imaging Studies ordered:  I ordered imaging studies including CT head and x-ray of right elbow I independently visualized and interpreted imaging which showed negative I agree with the radiologist interpretation   Cardiac Monitoring: / EKG:  The patient was maintained on a cardiac monitor.  I personally viewed and interpreted the cardiac monitored which showed an underlying rhythm of: Normal sinus rhythm   Consultations Obtained: No consultant Problem List / ED Course / Critical interventions / Medication management  Head injury and dementia No medicines given Reevaluation of the patient after these medicines showed that the patient stayed the same I have reviewed the patients home medicines and have made adjustments as needed   Social Determinants of Health:  None   Test / Admission - Considered:  None  X-rays  unremarkable.  Patient has a contusion to forehead and right elbow.  He will follow-up as needed        Final Clinical Impression(s) / ED Diagnoses Final diagnoses:  Injury of head, initial encounter    Rx / DC Orders ED Discharge Orders     None         Milton Ferguson, MD 03/01/22 1743    Milton Ferguson, MD 03/01/22 1744

## 2022-03-01 NOTE — ED Notes (Signed)
Patient cleaned up, new gown and brief applied before discharge.

## 2022-03-01 NOTE — ED Notes (Signed)
[facility] called for transport.  

## 2022-03-01 NOTE — Discharge Instructions (Signed)
Clean abrasions with soap and water twice a day.  Follow-up as needed
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
## 2022-04-19 ENCOUNTER — Other Ambulatory Visit: Payer: Self-pay

## 2022-04-19 ENCOUNTER — Encounter (HOSPITAL_COMMUNITY): Payer: Self-pay

## 2022-04-19 ENCOUNTER — Emergency Department (HOSPITAL_COMMUNITY): Payer: Medicare Other

## 2022-04-19 ENCOUNTER — Emergency Department (HOSPITAL_COMMUNITY)
Admission: EM | Admit: 2022-04-19 | Discharge: 2022-04-19 | Disposition: A | Discharge status: Home / Self Care | Payer: Medicare Other | Attending: Emergency Medicine | Admitting: Emergency Medicine

## 2022-04-19 DIAGNOSIS — I2510 Atherosclerotic heart disease of native coronary artery without angina pectoris: Secondary | ICD-10-CM | POA: Diagnosis not present

## 2022-04-19 DIAGNOSIS — I129 Hypertensive chronic kidney disease with stage 1 through stage 4 chronic kidney disease, or unspecified chronic kidney disease: Secondary | ICD-10-CM | POA: Insufficient documentation

## 2022-04-19 DIAGNOSIS — N189 Chronic kidney disease, unspecified: Secondary | ICD-10-CM | POA: Insufficient documentation

## 2022-04-19 DIAGNOSIS — Z8546 Personal history of malignant neoplasm of prostate: Secondary | ICD-10-CM | POA: Insufficient documentation

## 2022-04-19 DIAGNOSIS — Z85828 Personal history of other malignant neoplasm of skin: Secondary | ICD-10-CM | POA: Diagnosis not present

## 2022-04-19 DIAGNOSIS — S51811A Laceration without foreign body of right forearm, initial encounter: Secondary | ICD-10-CM | POA: Insufficient documentation

## 2022-04-19 DIAGNOSIS — W1830XA Fall on same level, unspecified, initial encounter: Secondary | ICD-10-CM | POA: Insufficient documentation

## 2022-04-19 DIAGNOSIS — Z7982 Long term (current) use of aspirin: Secondary | ICD-10-CM | POA: Diagnosis not present

## 2022-04-19 DIAGNOSIS — Z79899 Other long term (current) drug therapy: Secondary | ICD-10-CM | POA: Insufficient documentation

## 2022-04-19 DIAGNOSIS — G309 Alzheimer's disease, unspecified: Secondary | ICD-10-CM | POA: Insufficient documentation

## 2022-04-19 DIAGNOSIS — W19XXXA Unspecified fall, initial encounter: Secondary | ICD-10-CM

## 2022-04-19 DIAGNOSIS — M25511 Pain in right shoulder: Secondary | ICD-10-CM | POA: Diagnosis present

## 2022-04-19 NOTE — ED Triage Notes (Signed)
Patient BIB [facility] from [facility] with complaint of GLF. Facility reports patient landed on Right shoulder, No LOC, No Blood Thinners. Patient at mental baseline per EMS.

## 2022-04-19 NOTE — ED Provider Notes (Addendum)
[place] EMERGENCY DEPARTMENT AT [facility] Provider Note   CSN: [id number] Arrival date & time: 04/19/22  2027     History  Chief Complaint  Patient presents with   Matthew [name]    Matthew [name] is a 89 y.o. male.   Fall  Patient presents with fall.  From nursing home.  Baseline dementia.  Reported ground-level fall.  Patient will not provide any history.  Does move his extremities.  Will hold his eyes closed however.  Not on blood thinners. for    Past Medical History:  Diagnosis Date   Alzheimer's dementia    "don't know what stage but it's not early" (04/30/2015)   Anxiety    Arthritis    CAD (coronary artery disease)    a.  mild non obstructive CAD on cath 02/2011   Cataract    NOS   Chronic kidney disease    Diverticulosis of colon    Dysphagia    PMH of   Hemorrhoids, internal    Hiatal hernia    History of colon polyps 2005   hyperplastic; Dr Jarold Motto   Hyperlipidemia    Hypertension    Impotence of organic origin    Metabolic syndrome X    Prostate cancer    Dr Aldean Ast   PVD (peripheral vascular disease)    Schatzki's ring    Skin cancer     Home Medications Prior to Admission medications   Medication Sig Start Date End Date Taking? Authorizing Provider  acetaminophen (TYLENOL) 500 MG tablet Take 250 mg by mouth 2 (two) times daily.    [provider]  aspirin EC 81 MG tablet Take 81 mg by mouth in the morning.    [provider]  cloNIDine (CATAPRES) 0.2 MG tablet TAKE 1 TABLET(0.2 MG) BY MOUTH AT BEDTIME Patient taking differently: Take 0.2 mg by mouth at bedtime. 09/17/19   Pincus Sanes, MD  docusate sodium (COLACE) 100 MG capsule Take 100 mg by mouth daily.    [provider]  Emollient (AQUAPHOR ADVANCED THERAPY) 41 % OINT Apply 1 application  topically at bedtime. To bilateral lower legs and feet    [provider]  escitalopram (LEXAPRO) 20 MG tablet TAKE 1 TABLET(20 MG) BY MOUTH DAILY Patient  taking differently: Take 20 mg by mouth in the morning. 09/17/19   Burns, Bobette Mo, MD  LORazepam (ATIVAN) 1 MG tablet Take 1 mg by mouth 2 (two) times daily as needed for anxiety (agitation).    [provider]  melatonin 5 MG TABS Take 5 mg by mouth at bedtime.    [provider]  polyethylene glycol powder (GLYCOLAX/MIRALAX) 17 GM/SCOOP powder Take 17 g by mouth in the morning.    [provider]  pregabalin (LYRICA) 75 MG capsule TAKE 1 CAPSULE(75 MG) BY MOUTH TWICE DAILY Patient taking differently: Take 75 mg by mouth 2 (two) times daily. 05/19/19   Corwin Levins, MD  simvastatin (ZOCOR) 40 MG tablet TAKE 1 TABLET(40 MG) BY MOUTH DAILY AT 6 PM Patient taking differently: Take 40 mg by mouth at bedtime. 06/12/20   Pincus Sanes, MD  traMADol (ULTRAM) 50 MG tablet Take 1 tablet (50 mg total) by mouth every 8 (eight) hours as needed. Patient taking differently: Take 50 mg by mouth 2 (two) times daily as needed (Pain). 08/29/19   Pincus Sanes, MD      Allergies    Patient has no known allergies.    Review  of Systems   Review of Systems  Physical Exam Updated Vital Signs BP (!) 137/52   Pulse (!) 48   Temp 97.6 F (36.4 C) (Axillary)   Resp (!) 9   Ht 5\' 9"  (1.753 m)   Wt 72.6 kg   SpO2 99%   BMI 23.63 kg/m  Physical Exam Vitals reviewed.  HENT:     Head: Atraumatic.  Eyes:     Comments: Patient holding eyes closed.  Cardiovascular:     Rate and Rhythm: Regular rhythm.  Chest:     Chest wall: No tenderness.  Abdominal:     Tenderness: There is no abdominal tenderness.  Musculoskeletal:     Comments: Small skin tear right forearm.  No underlying bony tenderness.  Good range of motion in wrist shoulder and elbow.  No other extremity tenderness.  No lumbar tenderness.  Neurological:     Mental Status: He is alert.     Comments: Moves extremities, holding eyes closed.  Will not follow many commands but did squeeze on each side.     ED Results /  Procedures / Treatments   Labs (all labs ordered are listed, but only abnormal results are displayed) Labs Reviewed - No data to display  EKG None  Radiology DG Elbow Complete Right  Result Date: 04/19/2022 CLINICAL DATA:  Fall EXAM: RIGHT ELBOW - COMPLETE 3+ VIEW COMPARISON:  None Available. FINDINGS: There is no evidence of fracture, dislocation, or joint effusion. There is no evidence of arthropathy or other focal bone abnormality. Soft tissues are unremarkable. IMPRESSION: Negative. Electronically Signed   By: Jasmine Pang M.D.   On: 04/19/2022 21:47   DG Shoulder Right Port  Result Date: 04/19/2022 CLINICAL DATA:  Fall EXAM: RIGHT SHOULDER - 1 VIEW COMPARISON:  None Available. FINDINGS: There is no acute fracture or dislocation identified. Mild degenerative changes affect the shoulder. Calcific tendinitis changes are noted. IMPRESSION: 1. No acute findings. 2. Calcific tendinitis. Electronically Signed   By: Darliss Cheney M.D.   On: 04/19/2022 21:23    Procedures Procedures    Medications Ordered in ED Medications - No data to display  ED Course/ Medical Decision Making/ A&P                             Medical Decision Making Amount and/or Complexity of Data Reviewed Radiology: ordered.   Patient with reported ground-level fall.  No evidence of trauma on head.  Reviewed notes and not on blood thinners.  X-ray reassuring.  Does have skin tear on right forearm.  Appears stable for discharge home with outpatient follow-up.  Blood pressure reassuring.  Initially low but on recheck was normal.  Patient's daughter later in the room.  States that he is at baseline.  No evidence of trauma besides a skin tear.  Will still follow some commands.  She feels comfortable with him going home.  Will discharge.       Final Clinical Impression(s) / ED Diagnoses Final diagnoses:  Fall, initial encounter    Rx / DC Orders ED Discharge Orders     None         Benjiman Core,  MD 04/19/22 2254    Benjiman Core, MD 04/19/22 2311

## 2022-05-25 ENCOUNTER — Emergency Department (HOSPITAL_COMMUNITY): Payer: Medicare Other

## 2022-05-25 ENCOUNTER — Emergency Department (HOSPITAL_COMMUNITY)
Admission: EM | Admit: 2022-05-25 | Discharge: 2022-05-25 | Disposition: A | Discharge status: Home / Self Care | Payer: Medicare Other | Attending: Emergency Medicine | Admitting: Emergency Medicine

## 2022-05-25 ENCOUNTER — Other Ambulatory Visit: Payer: Self-pay

## 2022-05-25 ENCOUNTER — Encounter (HOSPITAL_COMMUNITY): Payer: Self-pay | Admitting: Emergency Medicine

## 2022-05-25 DIAGNOSIS — S59911A Unspecified injury of right forearm, initial encounter: Secondary | ICD-10-CM | POA: Diagnosis present

## 2022-05-25 DIAGNOSIS — F0390 Unspecified dementia without behavioral disturbance: Secondary | ICD-10-CM | POA: Diagnosis not present

## 2022-05-25 DIAGNOSIS — W1830XA Fall on same level, unspecified, initial encounter: Secondary | ICD-10-CM | POA: Diagnosis not present

## 2022-05-25 DIAGNOSIS — Z79899 Other long term (current) drug therapy: Secondary | ICD-10-CM | POA: Insufficient documentation

## 2022-05-25 DIAGNOSIS — Z7982 Long term (current) use of aspirin: Secondary | ICD-10-CM | POA: Diagnosis not present

## 2022-05-25 DIAGNOSIS — N189 Chronic kidney disease, unspecified: Secondary | ICD-10-CM | POA: Diagnosis not present

## 2022-05-25 DIAGNOSIS — I2510 Atherosclerotic heart disease of native coronary artery without angina pectoris: Secondary | ICD-10-CM | POA: Insufficient documentation

## 2022-05-25 DIAGNOSIS — W19XXXA Unspecified fall, initial encounter: Secondary | ICD-10-CM

## 2022-05-25 DIAGNOSIS — I129 Hypertensive chronic kidney disease with stage 1 through stage 4 chronic kidney disease, or unspecified chronic kidney disease: Secondary | ICD-10-CM | POA: Insufficient documentation

## 2022-05-25 DIAGNOSIS — S50811A Abrasion of right forearm, initial encounter: Secondary | ICD-10-CM | POA: Insufficient documentation

## 2022-05-25 MED ORDER — ACETAMINOPHEN 325 MG PO TABS
650.0000 mg | ORAL_TABLET | Freq: Once | ORAL | Status: AC
  Administered 2022-05-25: 650 mg via ORAL
  Filled 2022-05-25: qty 2

## 2022-05-25 MED ORDER — HALOPERIDOL 1 MG PO TABS
2.0000 mg | ORAL_TABLET | Freq: Once | ORAL | Status: AC
  Administered 2022-05-25: 2 mg via ORAL
  Filled 2022-05-25: qty 2

## 2022-05-25 NOTE — ED Notes (Signed)
[facility] called  

## 2022-05-25 NOTE — ED Triage Notes (Signed)
Patient had and witnessed fall today, The facility believes he may have injured his hip. He fell on his buttock and may have hit his head. Denies blood thinners. Patient is a little more unstable today than usual, but is cognitively at baseline.   Hx: Dementia   EMS vitals: 136/72 BP 80 HR 16 RR 97 % SPO2 on room air

## 2022-05-25 NOTE — Discharge Instructions (Signed)
Your imaging studies today did not show any new injuries.  Return to the emergency department for any new or worsening symptoms of concern.

## 2022-05-25 NOTE — ED Notes (Addendum)
Seizure pad were put in place for safety measures. Pt had his legs all through the rails of the bed.

## 2022-05-25 NOTE — ED Provider Notes (Signed)
[place] EMERGENCY DEPARTMENT AT [facility] Provider Note   CSN: [id number] Arrival date & time: 05/25/22  1721     History  Chief Complaint  Patient presents with   Marletta Lor    Matthew [name] is a 89 y.o. male.  HPI Patient presents for a fall.  Medical history includes dementia, arthritis, CAD, CKD, HLD, HTN, PVD, GERD.  Patient resides in skilled nursing facility.  He had a witnessed fall.  Fall was described as mechanical as patient fell backwards.  He landed on his buttocks and did strike the back of his head.  He sustained a small skin tear to his right forearm.    Home Medications Prior to Admission medications   Medication Sig Start Date End Date Taking? Authorizing Provider  acetaminophen (TYLENOL) 500 MG tablet Take 250 mg by mouth 2 (two) times daily.    [provider]  aspirin EC 81 MG tablet Take 81 mg by mouth in the morning.    [provider]  cloNIDine (CATAPRES) 0.2 MG tablet TAKE 1 TABLET(0.2 MG) BY MOUTH AT BEDTIME Patient taking differently: Take 0.2 mg by mouth at bedtime. 09/17/19   Pincus Sanes, MD  docusate sodium (COLACE) 100 MG capsule Take 100 mg by mouth daily.    [provider]  Emollient (AQUAPHOR ADVANCED THERAPY) 41 % OINT Apply 1 application  topically at bedtime. To bilateral lower legs and feet    [provider]  escitalopram (LEXAPRO) 20 MG tablet TAKE 1 TABLET(20 MG) BY MOUTH DAILY Patient taking differently: Take 20 mg by mouth in the morning. 09/17/19   Burns, Bobette Mo, MD  LORazepam (ATIVAN) 1 MG tablet Take 1 mg by mouth 2 (two) times daily as needed for anxiety (agitation).    [provider]  melatonin 5 MG TABS Take 5 mg by mouth at bedtime.    [provider]  polyethylene glycol powder (GLYCOLAX/MIRALAX) 17 GM/SCOOP powder Take 17 g by mouth in the morning.    [provider]  pregabalin (LYRICA) 75 MG capsule TAKE 1 CAPSULE(75 MG) BY MOUTH TWICE DAILY Patient  taking differently: Take 75 mg by mouth 2 (two) times daily. 05/19/19   Corwin Levins, MD  simvastatin (ZOCOR) 40 MG tablet TAKE 1 TABLET(40 MG) BY MOUTH DAILY AT 6 PM Patient taking differently: Take 40 mg by mouth at bedtime. 06/12/20   Pincus Sanes, MD  traMADol (ULTRAM) 50 MG tablet Take 1 tablet (50 mg total) by mouth every 8 (eight) hours as needed. Patient taking differently: Take 50 mg by mouth 2 (two) times daily as needed (Pain). 08/29/19   Pincus Sanes, MD      Allergies    Patient has no known allergies.    Review of Systems   Review of Systems  Unable to perform ROS: Dementia    Physical Exam Updated Vital Signs BP (!) 137/97   Pulse 65   Temp 97.8 F (36.6 C)   Resp 12   SpO2 100%  Physical Exam Vitals and nursing note reviewed.  Constitutional:      General: He is not in acute distress.    Appearance: Normal appearance. He is well-developed. He is not ill-appearing, toxic-appearing or diaphoretic.  HENT:     Head: Normocephalic and atraumatic.     Right Ear: External ear normal.     Left Ear: External ear normal.     Nose: Nose normal.     Mouth/Throat:  Mouth: Mucous membranes are moist.  Eyes:     Extraocular Movements: Extraocular movements intact.     Conjunctiva/sclera: Conjunctivae normal.  Cardiovascular:     Rate and Rhythm: Normal rate and regular rhythm.  Pulmonary:     Effort: Pulmonary effort is normal. No respiratory distress.  Chest:     Chest wall: No tenderness.  Abdominal:     General: There is no distension.     Palpations: Abdomen is soft.     Tenderness: There is no abdominal tenderness.  Musculoskeletal:        General: No swelling. Normal range of motion.     Cervical back: Normal range of motion and neck supple.     Right lower leg: No edema.     Left lower leg: No edema.  Skin:    General: Skin is warm and dry.     Coloration: Skin is not jaundiced or pale.     Comments: Small abrasion to proximal right forearm.   Neurological:     General: No focal deficit present.     Mental Status: He is alert. Mental status is at baseline. He is disoriented.  Psychiatric:        Mood and Affect: Mood normal.        Behavior: Behavior normal.     ED Results / Procedures / Treatments   Labs (all labs ordered are listed, but only abnormal results are displayed) Labs Reviewed - No data to display  EKG EKG Interpretation  Date/Time:  Wednesday May 25 2022 17:41:04 EDT Ventricular Rate:  62 PR Interval:    QRS Duration: 88 QT Interval:  423 QTC Calculation: 430 R Axis:   73 Text Interpretation: Sinus rhythm Borderline repolarization abnormality Confirmed by Gloris Manchester [id number]) on 05/25/2022 6:09:39 PM  Radiology DG Pelvis Portable  Result Date: 05/25/2022 CLINICAL DATA:  Trauma, fall EXAM: PORTABLE PELVIS 1-2 VIEWS COMPARISON:  03/01/2022 FINDINGS: No displaced fracture or dislocation is seen. Surgical clips are seen in pelvis. Degenerative changes are noted in the visualized lower lumbar spine. Last lumbar vertebra is transitional. Arterial calcifications are seen in soft tissues. IMPRESSION: No recent fracture is seen in pelvis. Lumbar spondylosis. Arteriosclerosis. Electronically Signed   By: Ernie Avena M.D.   On: 05/25/2022 18:35   DG Chest Port 1 View  Result Date: 05/25/2022 CLINICAL DATA:  Trauma, fall EXAM: PORTABLE CHEST 1 VIEW COMPARISON:  12/19/2019 FINDINGS: Cardiac size is within normal limits. Thoracic aorta is tortuous. There are no signs of pulmonary edema or new focal infiltrates. There is no pleural effusion or pneumothorax. Hepatic flexure appears to be in subdiaphragmatic location. IMPRESSION: No active disease. Electronically Signed   By: Ernie Avena M.D.   On: 05/25/2022 18:34   CT HEAD WO CONTRAST  Result Date: 05/25/2022 CLINICAL DATA:  Trauma, fall EXAM: CT HEAD WITHOUT CONTRAST TECHNIQUE: Contiguous axial images were obtained from the base of the skull through the  vertex without intravenous contrast. RADIATION DOSE REDUCTION: This exam was performed according to the departmental dose-optimization program which includes automated exposure control, adjustment of the mA and/or kV according to patient size and/or use of iterative reconstruction technique. COMPARISON:  03/01/2022 FINDINGS: Brain: No acute intracranial findings are seen. There are no signs of bleeding within the cranium. Cortical sulci are prominent. Cavum septum vergae and cavum septum pellucidum noted. There is decreased density in periventricular and subcortical white matter. Vascular: Scattered arterial calcifications are seen. Skull: No fracture is seen in the calvarium. Hyperostosis frontalis  interna is seen. There is interval resolution of subcutaneous hematoma seen in the right frontal scalp since the previous study. Sinuses/Orbits: There is mild mucosal thickening in ethmoid sinus. Other: None. IMPRESSION: No acute intracranial findings are seen. Atrophy. Small-vessel disease. Electronically Signed   By: Ernie Avena M.D.   On: 05/25/2022 18:33   CT CERVICAL SPINE WO CONTRAST  Result Date: 05/25/2022 CLINICAL DATA:  Polytrauma, blunt EXAM: CT CERVICAL SPINE WITHOUT CONTRAST TECHNIQUE: Multidetector CT imaging of the cervical spine was performed without intravenous contrast. Multiplanar CT image reconstructions were also generated. RADIATION DOSE REDUCTION: This exam was performed according to the departmental dose-optimization program which includes automated exposure control, adjustment of the mA and/or kV according to patient size and/or use of iterative reconstruction technique. COMPARISON:  Cervical spine CT 03/01/2022 FINDINGS: Alignment: Straightening of the cervical lordosis. Grade 1 degenerative anterolisthesis at C3-C4. Skull base and vertebrae: There is bony fusion of the C4-C5 and C5-C6 vertebral bodies. Bony fusion of multiple facet joints bilaterally. There is no evidence of acute  cervical spine fracture. Soft tissues and spinal canal: No prevertebral fluid or swelling. No visible canal hematoma. Disc levels: Solid fusion across C4-C5 and C5-C6. Mild degenerative disc disease above and below these levels and multilevel facet arthropathy with multiple fused facet joints. There is at least moderate right-sided neural foraminal stenosis at C3-C4 and C4-C5 and mild at C5-C6. Probable mild left-sided neural foraminal narrowing at C3-C4. Mild-to-moderate spinal canal narrowing at C3-C4 predominantly due to disc bulging. Upper chest: Negative. Other: None. IMPRESSION: No evidence of acute cervical spine fracture. Electronically Signed   By: Caprice Renshaw M.D.   On: 05/25/2022 18:30    Procedures Procedures    Medications Ordered in ED Medications  acetaminophen (TYLENOL) tablet 650 mg (650 mg Oral Given 05/25/22 1758)  haloperidol (HALDOL) tablet 2 mg (2 mg Oral Given 05/25/22 1758)    ED Course/ Medical Decision Making/ A&P                             Medical Decision Making Amount and/or Complexity of Data Reviewed Radiology: ordered.  Risk OTC drugs. Prescription drug management.   This patient presents to the ED for concern of fall, this involves an extensive number of treatment options, and is a complaint that carries with it a high risk of complications and morbidity.  The differential diagnosis includes acute injuries   Co morbidities that complicate the patient evaluation  dementia, arthritis, CAD, CKD, HLD, HTN, PVD, GERD   Additional history obtained:  Additional history obtained from EMS External records from outside source obtained and reviewed including EMR   Imaging Studies ordered:  I ordered imaging studies including x-ray of chest and pelvis, CT imaging of head and cervical spine I independently visualized and interpreted imaging which showed no acute findings I agree with the radiologist interpretation   Cardiac Monitoring: / EKG:  The  patient was maintained on a cardiac monitor.  I personally viewed and interpreted the cardiac monitored which showed an underlying rhythm of: Sinus rhythm   Problem List / ED Course / Critical interventions / Medication management  Patient is a 89 year old male with history of advanced dementia, presenting from skilled nursing facility after a witnessed mechanical fall.  He did reportedly struck his buttocks and back of head.  He has a small swelling to occiput.  It is unclear if this is chronic.  Patient has a very minor abrasion to  right forearm.  He is moving all extremities.  He does not elicit any evidence of pain with palpation or range of motion of his extremities.  Imaging studies were ordered.  Imaging studies were negative for acute findings.  Although there was documented tachycardia while in the ED, this is secondary to artifact from tremor with fingertip pulse oximeter.  Patient actually maintained normal heart rate while in the ED.  He was discharged in stable condition. I ordered medication including Tylenol for analgesia; Haldol for agitation Reevaluation of the patient after these medicines showed that the patient improved I have reviewed the patients home medicines and have made adjustments as needed   Social Determinants of Health:  Resides in skilled nursing facility, has advanced dementia        Final Clinical Impression(s) / ED Diagnoses Final diagnoses:  Fall, initial encounter    Rx / DC Orders ED Discharge Orders     None         Gloris Manchester, MD 05/25/22 [phone]

## 2022-05-26 ENCOUNTER — Other Ambulatory Visit: Payer: Self-pay

## 2022-05-26 ENCOUNTER — Emergency Department (HOSPITAL_COMMUNITY): Payer: Medicare Other

## 2022-05-26 ENCOUNTER — Emergency Department (HOSPITAL_COMMUNITY)
Admission: EM | Admit: 2022-05-26 | Discharge: 2022-05-26 | Disposition: A | Discharge status: Home / Self Care | Payer: Medicare Other | Attending: Emergency Medicine | Admitting: Emergency Medicine

## 2022-05-26 DIAGNOSIS — W1830XA Fall on same level, unspecified, initial encounter: Secondary | ICD-10-CM | POA: Insufficient documentation

## 2022-05-26 DIAGNOSIS — Z7982 Long term (current) use of aspirin: Secondary | ICD-10-CM | POA: Insufficient documentation

## 2022-05-26 DIAGNOSIS — R0902 Hypoxemia: Secondary | ICD-10-CM | POA: Insufficient documentation

## 2022-05-26 DIAGNOSIS — F0390 Unspecified dementia without behavioral disturbance: Secondary | ICD-10-CM | POA: Insufficient documentation

## 2022-05-26 DIAGNOSIS — Z79899 Other long term (current) drug therapy: Secondary | ICD-10-CM | POA: Insufficient documentation

## 2022-05-26 DIAGNOSIS — I10 Essential (primary) hypertension: Secondary | ICD-10-CM | POA: Insufficient documentation

## 2022-05-26 DIAGNOSIS — W19XXXA Unspecified fall, initial encounter: Secondary | ICD-10-CM

## 2022-05-26 NOTE — ED Notes (Signed)
Pt Ax1, pt cannot verbally articulate name or date of birth, cannot verbalize pain. Pt does respond yes to name. Pt is resting in bed O2 100% RA, BP 151/81 102 MAP, pulse 63.

## 2022-05-26 NOTE — ED Notes (Signed)
Nurse spoke to [place] from [place] and gave report.

## 2022-05-26 NOTE — ED Notes (Signed)
Nurse attempted to call [facility] no answer... phone did not ring only error message.

## 2022-05-26 NOTE — ED Provider Notes (Signed)
Matthew [name]   CSN: [id number] Arrival date & time: 05/26/22  1900     History  Chief Complaint  Patient presents with   low oxygen saturation    Matthew [name] is a 89 y.o. male.  89 y.o male with Dementia, Anxiety, HTN presents to the ED via EMS with a chief complaint of hypoxia.  According to report, patient was hypoxic with an oxygen saturation at 82% on room air, sent by facility.  Level 5 caveat due to dementia.  The history is provided by medical records and the EMS personnel.       Home Medications Prior to Admission medications   Medication Sig Start Date End Date Taking? Authorizing Provider  acetaminophen (TYLENOL) 500 MG tablet Take 250 mg by mouth 2 (two) times daily.    [provider]  aspirin EC 81 MG tablet Take 81 mg by mouth in the morning.    [provider]  cloNIDine (CATAPRES) 0.2 MG tablet TAKE 1 TABLET(0.2 MG) BY MOUTH AT BEDTIME Patient taking differently: Take 0.2 mg by mouth at bedtime. 09/17/19   Pincus Sanes, MD  docusate sodium (COLACE) 100 MG capsule Take 100 mg by mouth daily.    [provider]  Emollient (AQUAPHOR ADVANCED THERAPY) 41 % OINT Apply 1 application  topically at bedtime. To bilateral lower legs and feet    [provider]  escitalopram (LEXAPRO) 20 MG tablet TAKE 1 TABLET(20 MG) BY MOUTH DAILY Patient taking differently: Take 20 mg by mouth in the morning. 09/17/19   Burns, Bobette Mo, MD  LORazepam (ATIVAN) 1 MG tablet Take 1 mg by mouth 2 (two) times daily as needed for anxiety (agitation).    [provider]  melatonin 5 MG TABS Take 5 mg by mouth at bedtime.    [provider]  polyethylene glycol powder (GLYCOLAX/MIRALAX) 17 GM/SCOOP powder Take 17 g by mouth in the morning.    [provider]  pregabalin (LYRICA) 75 MG capsule TAKE 1 CAPSULE(75 MG) BY MOUTH TWICE DAILY Patient taking differently: Take 75 mg by  mouth 2 (two) times daily. 05/19/19   Corwin Levins, MD  simvastatin (ZOCOR) 40 MG tablet TAKE 1 TABLET(40 MG) BY MOUTH DAILY AT 6 PM Patient taking differently: Take 40 mg by mouth at bedtime. 06/12/20   Pincus Sanes, MD  traMADol (ULTRAM) 50 MG tablet Take 1 tablet (50 mg total) by mouth every 8 (eight) hours as needed. Patient taking differently: Take 50 mg by mouth 2 (two) times daily as needed (Pain). 08/29/19   Pincus Sanes, MD      Allergies    Patient has no known allergies.    Review of Systems   Review of Systems  Unable to perform ROS: Dementia    Physical Exam Updated Vital Signs BP (!) 183/107 (BP Location: Right Arm)   Pulse 69   Temp 99 F (37.2 C)   Resp 16   SpO2 97%  Physical Exam Vitals and nursing [name] reviewed.  Constitutional:      Appearance: Normal appearance.  Musculoskeletal:     Cervical back: Normal range of motion and neck supple.  Neurological:     Mental Status: He is alert.     ED Results / Procedures / Treatments   Labs (all labs ordered are listed, but only abnormal results are displayed) Labs Reviewed - No data to display  EKG None  Radiology No  results found.  Procedures Procedures    Medications Ordered in ED Medications - No data to display  ED Course/ Medical Decision Making/ A&P                             Medical Decision Making Amount and/or Complexity of Data Reviewed Radiology: ordered.    Patient presents to the ED via EMS for hypoxia.  According to EMS report, patient was asked to be transferred due to his oxygen saturation being 82% while at [facility] facility.  EMS did check his oxygen and reports it was 100%, patient just appeared to be cold.  Patient does have underlying dementia, therefore it was very difficult to obtain history, therefore calls were placed to power of attorney along with facility.  8:11 PM Spoke to sylvia daughter on the line who was told patient was hypoxic and sent to the ED for     8:19 PM Spoke to [place] RN who reported patient has suffered another fall this morning, they have been monitoring him, and they did have a hospice assessment done, however they went to check on him and his fingers were blue, his oxygen saturation was 82%, this was not rechecked and patient was sent to the emergency department for further evaluation for his hypoxia.  Of [name], patient had been evaluated yesterday and had a negative CT head, CT neck, pelvis, chest x-rays.  I discussed with nursing manager the patient will have a second scan today of his head and neck but will likely return to facility as he has been around 100% on room air without any signs of tachypnea, no signs of respiratory distress.   CT Head/ Cervical spine: Alignment: There is minimal 1-2 mm anterolisthesis at C3-C4 level  with no significant change. There is partial fusion of bodies of C3,  C4 and C5 vertebrae.    Skull base and vertebrae: Motion artifacts limit evaluation. As far  as seen, no recent fracture is noted. There is 3 mm smoothly  marginated calcification in the posterior aspect of left facet joint  at C3-C4 level which has not changed.    Soft tissues and spinal canal: Prevertebral soft tissues are  unremarkable. There is extrinsic pressure over the ventral margin of  thecal sac caused by posterior bony spurs at multiple levels.    Disc levels: There is encroachment of neural foramina by bony spurs  from C2-C7 levels.    Upper chest: Limited visualization of posterior apical regions of  both lungs show no focal abnormalities.    Other: No significant interval changes are noted.    IMPRESSION:  No recent fracture is seen. Cervical spondylosis. No significant  changes are noted since 05/25/2022      Patient's imaging is unchanged from prior.  His vitals have remained stable the entirety of his stay.  He is undergoing hospice care starting tomorrow according to the facility, although forms have not been  signed.  Facility is aware that patient will return back to them.  Hemodynamically stable for discharge.   Portions of this [name] were generated with Scientist, clinical (histocompatibility and immunogenetics). Dictation errors may occur despite best attempts at proofreading.   Final Clinical Impression(s) / ED Diagnoses Final diagnoses:  Fall, initial encounter    Rx / DC Orders ED Discharge Orders     None         Claude Manges, Cordelia Poche 06/01/22 2335    Gwyneth Sprout, MD 06/02/22 [phone]

## 2022-05-26 NOTE — Discharge Instructions (Addendum)
Your oxygen saturation was 100% while in the ED.  The CT of your head and neck were within normal limits.

## 2022-05-26 NOTE — ED Triage Notes (Signed)
Pt arrived via EMS from [facility] for low O2. 82% on RA per facility, EMS stated pt has has poor perfusion had to use earlobe for O2, got 100% on RA. 140/72 palpated [id number]

## 2022-06-11 DEATH — deceased
